# Patient Record
Sex: Male | Born: 1945 | Race: White | Hispanic: No | Marital: Married | Smoking: Former smoker
Health system: Southern US, Community
[De-identification: ages and names within clinical notes are randomized; demographics above are authoritative.]

## PROBLEM LIST (undated history)

## (undated) DIAGNOSIS — E7800 Pure hypercholesterolemia, unspecified: Secondary | ICD-10-CM

## (undated) DIAGNOSIS — J449 Chronic obstructive pulmonary disease, unspecified: Secondary | ICD-10-CM

## (undated) DIAGNOSIS — R569 Unspecified convulsions: Secondary | ICD-10-CM

## (undated) DIAGNOSIS — G4730 Sleep apnea, unspecified: Secondary | ICD-10-CM

---

## 2004-06-12 ENCOUNTER — Ambulatory Visit: Payer: Self-pay | Admitting: General Surgery

## 2009-01-12 ENCOUNTER — Ambulatory Visit: Payer: Self-pay | Admitting: Internal Medicine

## 2010-06-26 ENCOUNTER — Ambulatory Visit: Payer: Self-pay | Admitting: Orthopedic Surgery

## 2010-07-03 ENCOUNTER — Inpatient Hospital Stay: Payer: Self-pay | Admitting: Orthopedic Surgery

## 2013-12-04 DIAGNOSIS — I2510 Atherosclerotic heart disease of native coronary artery without angina pectoris: Secondary | ICD-10-CM | POA: Insufficient documentation

## 2013-12-04 DIAGNOSIS — E782 Mixed hyperlipidemia: Secondary | ICD-10-CM | POA: Insufficient documentation

## 2014-02-22 DIAGNOSIS — I6523 Occlusion and stenosis of bilateral carotid arteries: Secondary | ICD-10-CM | POA: Insufficient documentation

## 2014-02-22 DIAGNOSIS — I340 Nonrheumatic mitral (valve) insufficiency: Secondary | ICD-10-CM | POA: Insufficient documentation

## 2014-08-10 DIAGNOSIS — I10 Essential (primary) hypertension: Secondary | ICD-10-CM | POA: Insufficient documentation

## 2016-03-12 ENCOUNTER — Encounter: Payer: Medicare Other | Attending: Surgery | Admitting: Surgery

## 2016-03-12 DIAGNOSIS — X100XXA Contact with hot drinks, initial encounter: Secondary | ICD-10-CM | POA: Diagnosis not present

## 2016-03-12 DIAGNOSIS — T24211A Burn of second degree of right thigh, initial encounter: Secondary | ICD-10-CM | POA: Diagnosis not present

## 2016-03-12 DIAGNOSIS — S71101A Unspecified open wound, right thigh, initial encounter: Secondary | ICD-10-CM | POA: Insufficient documentation

## 2016-03-12 DIAGNOSIS — Z87891 Personal history of nicotine dependence: Secondary | ICD-10-CM | POA: Insufficient documentation

## 2016-03-12 DIAGNOSIS — I10 Essential (primary) hypertension: Secondary | ICD-10-CM | POA: Insufficient documentation

## 2016-03-12 DIAGNOSIS — F418 Other specified anxiety disorders: Secondary | ICD-10-CM | POA: Diagnosis not present

## 2016-03-12 DIAGNOSIS — E785 Hyperlipidemia, unspecified: Secondary | ICD-10-CM | POA: Insufficient documentation

## 2016-03-12 DIAGNOSIS — I2510 Atherosclerotic heart disease of native coronary artery without angina pectoris: Secondary | ICD-10-CM | POA: Insufficient documentation

## 2016-03-12 DIAGNOSIS — M1990 Unspecified osteoarthritis, unspecified site: Secondary | ICD-10-CM | POA: Diagnosis not present
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
## 2016-03-12 NOTE — Progress Notes (Signed)
OLAF, MESA ([id number]) Visit Report for 03/12/2016 Abuse/Suicide Risk Screen Details Patient Name: Alex Gomez, Alex Gomez. Date of Service: 03/12/2016 8:00 AM Medical Record Number: [id number] Patient Account Number: [id number] Date of Birth/Sex: 1945/05/27 (71 y.o. Male) Treating RN: Ahmed Prima Primary Care Nareh Matzke: SYSTEM, PCP Other Clinician: Referring Boaz Berisha: Serafina Royals Treating Massiel Stipp/Extender: Frann Rider in Treatment: 0 Abuse/Suicide Risk Screen Items Answer ABUSE/SUICIDE RISK SCREEN: Has anyone close to you tried to hurt or harm you recentlyo No Do you feel uncomfortable with anyone in your familyo No Has anyone forced you do things that you didnot want to doo No Do you have any thoughts of harming yourselfo No Patient displays signs or symptoms of abuse and/or neglect. No Electronic Signature(s) Signed: 03/12/2016 12:02:17 PM By: Alric Quan Entered By: Alric Quan on 03/12/2016 08:25:17 Alex Gomez ([id number]) -------------------------------------------------------------------------------- Activities of Daily Living Details Patient Name: Alex Gomez, Alex Gomez. Date of Service: 03/12/2016 8:00 AM Medical Record Number: [id number] Patient Account Number: [id number] Date of Birth/Sex: 11/03/1945 (71 y.o. Male) Treating RN: Ahmed Prima Primary Care Lancer Thurner: SYSTEM, PCP Other Clinician: Referring Tayleigh Wetherell: Serafina Royals Treating Marrissa Dai/Extender: Frann Rider in Treatment: 0 Activities of Daily Living Items Answer Activities of Daily Living (Please select one for each item) Drive Automobile Completely Able Take Medications Completely Able Use Telephone Completely Able Care for Appearance Completely Able Use Toilet Completely Able Bath / Shower Completely Able Dress Self Completely Able Feed Self Completely Able Walk Completely Able Get In / Out Bed Completely Able Housework Completely Able Prepare Meals Completely [place] for Self Completely Able Electronic Signature(s) Signed: 03/12/2016 12:02:17 PM By: Alric Quan Entered By: Alric Quan on 03/12/2016 08:25:38 Alex Gomez ([id number]) -------------------------------------------------------------------------------- Education Assessment Details Patient Name: Alex Gomez Date of Service: 03/12/2016 8:00 AM Medical Record Number: [id number] Patient Account Number: [id number] Date of Birth/Sex: 10-29-1945 (71 y.o. Male) Treating RN: Ahmed Prima Primary Care Honey Zakarian: SYSTEM, PCP Other Clinician: Referring Breaunna Gottlieb: Serafina Royals Treating Tyray Proch/Extender: Frann Rider in Treatment: 0 Primary Learner Assessed: Patient Learning Preferences/Education Level/Primary Language Learning Preference: Explanation, Printed Material Highest Education Level: College or Above Preferred Language: English Cognitive Barrier Assessment/Beliefs Language Barrier: No Translator Needed: No Memory Deficit: No Emotional Barrier: No Cultural/Religious Beliefs Affecting Medical No Care: Physical Barrier Assessment Impaired Vision: No Impaired Hearing: No Decreased Hand dexterity: No Knowledge/Comprehension Assessment Knowledge Level: Medium Comprehension Level: Medium Ability to understand written Medium instructions: Ability to understand verbal Medium instructions: Motivation Assessment Anxiety Level: Calm Cooperation: Cooperative Education Importance: Acknowledges Need Interest in Health Problems: Asks Questions Perception: Coherent Willingness to Engage in Self- Medium Management Activities: Readiness to Engage in Self- Medium Management Activities: Electronic Signature(s) Alex Gomez, Alex Gomez ([id number]) Signed: 03/12/2016 12:02:17 PM By: Alric Quan Entered By: Alric Quan on 03/12/2016 08:26:06 Alex Gomez  ([id number]) -------------------------------------------------------------------------------- Fall Risk Assessment Details Patient Name: Alex Gomez Date of Service: 03/12/2016 8:00 AM Medical Record Number: [id number] Patient Account Number: [id number] Date of Birth/Sex: [birth year] (70 y.o. Male) Treating RN: Ahmed Prima Primary Care Chanell Nadeau: SYSTEM, PCP Other Clinician: Referring Sims Laday: Serafina Royals Treating [place]bury Hair/Extender: Frann Rider in Treatment: 0 Fall Risk Assessment Items Have you had 2 or more falls in the last 12 monthso 0 No Have you had any fall that resulted in injury in the last 12 monthso 0 No FALL RISK ASSESSMENT: History of falling - immediate or within 3 months 0 No Secondary diagnosis 0 No Ambulatory aid None/bed rest/wheelchair/nurse 0 No Crutches/cane/walker 0 No  Furniture 0 No IV Access/Saline Lock 0 No Gait/Training Normal/bed rest/immobile 0 No Weak 0 No Impaired 0 No Mental Status Oriented to own ability 0 Yes Electronic Signature(s) Signed: 03/12/2016 12:02:17 PM By: Alric Quan Entered By: Alric Quan on 03/12/2016 08:26:18 Alex Gomez ([id number]) -------------------------------------------------------------------------------- Foot Assessment Details Patient Name: Alex Gomez. Date of Service: 03/12/2016 8:00 AM Medical Record Number: [id number] Patient Account Number: [id number] Date of Birth/Sex: August 31, 1945 (71 y.o. Male) Treating RN: Ahmed Prima Primary Care Gio Janoski: SYSTEM, PCP Other Clinician: Referring Kayleah Appleyard: Serafina Royals Treating Zoria Rawlinson/Extender: Frann Rider in Treatment: 0 Foot Assessment Items Site Locations + = Sensation present, - = Sensation absent, C = Callus, U = Ulcer R = Redness, W = Warmth, M = Maceration, PU = Pre-ulcerative lesion F = Fissure, S = Swelling, D = Dryness Assessment Right: Left: Other Deformity: No No Prior Foot Ulcer: No No Prior Amputation: No  No Charcot Joint: No No Ambulatory Status: Ambulatory Without Help Gait: Steady Electronic Signature(s) Signed: 03/12/2016 12:02:17 PM By: Alric Quan Entered By: Alric Quan on 03/12/2016 08:34:53 Alex Gomez ([id number]) -------------------------------------------------------------------------------- Nutrition Risk Assessment Details Patient Name: Alex Gomez. Date of Service: 03/12/2016 8:00 AM Medical Record Number: [id number] Patient Account Number: [id number] Date of Birth/Sex: 19-Feb-1945 (71 y.o. Male) Treating RN: Ahmed Prima Primary Care Lei Dower: SYSTEM, PCP Other Clinician: Referring Mada Sadik: Serafina Royals Treating Daivion Pape/Extender: Frann Rider in Treatment: 0 Height (in): 73 Weight (lbs): 240 Body Mass Index (BMI): 31.7 Nutrition Risk Assessment Items NUTRITION RISK SCREEN: I have an illness or condition that made me change the kind and/or 0 No amount of food I eat I eat fewer than two meals per day 0 No I eat few fruits and vegetables, or milk products 0 No I have three or more drinks of beer, liquor or wine almost every day 0 No I have tooth or mouth problems that make it hard for me to eat 0 No I don't always have enough money to buy the food I need 0 No I eat alone most of the time 0 No I take three or more different prescribed or over-the-counter drugs a 1 Yes day Without wanting to, I have lost or gained 10 pounds in the last six 0 No months I am not always physically able to shop, cook and/or feed myself 0 No Nutrition Protocols Good Risk Protocol Moderate Risk Protocol Electronic Signature(s) Signed: 03/12/2016 12:02:17 PM By: Alric Quan Entered By: Alric Quan on 03/12/2016 08:26:38
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
## 2016-03-19 ENCOUNTER — Encounter: Payer: Medicare Other | Admitting: Surgery

## 2016-03-19 DIAGNOSIS — S71101A Unspecified open wound, right thigh, initial encounter: Secondary | ICD-10-CM | POA: Diagnosis not present
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
## 2016-03-26 ENCOUNTER — Encounter: Payer: Medicare Other | Admitting: Surgery

## 2016-03-26 DIAGNOSIS — S71101A Unspecified open wound, right thigh, initial encounter: Secondary | ICD-10-CM | POA: Diagnosis not present
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
## 2016-04-02 ENCOUNTER — Encounter: Payer: Medicare Other | Attending: Surgery | Admitting: Surgery

## 2016-04-02 DIAGNOSIS — E785 Hyperlipidemia, unspecified: Secondary | ICD-10-CM | POA: Insufficient documentation

## 2016-04-02 DIAGNOSIS — X100XXA Contact with hot drinks, initial encounter: Secondary | ICD-10-CM | POA: Insufficient documentation

## 2016-04-02 DIAGNOSIS — S71101A Unspecified open wound, right thigh, initial encounter: Secondary | ICD-10-CM | POA: Insufficient documentation

## 2016-04-02 DIAGNOSIS — F418 Other specified anxiety disorders: Secondary | ICD-10-CM | POA: Insufficient documentation

## 2016-04-02 DIAGNOSIS — M1990 Unspecified osteoarthritis, unspecified site: Secondary | ICD-10-CM | POA: Insufficient documentation

## 2016-04-02 DIAGNOSIS — T24211A Burn of second degree of right thigh, initial encounter: Secondary | ICD-10-CM | POA: Insufficient documentation

## 2016-04-02 DIAGNOSIS — I10 Essential (primary) hypertension: Secondary | ICD-10-CM | POA: Diagnosis not present

## 2016-04-02 DIAGNOSIS — Z87891 Personal history of nicotine dependence: Secondary | ICD-10-CM | POA: Insufficient documentation

## 2016-04-02 DIAGNOSIS — I2510 Atherosclerotic heart disease of native coronary artery without angina pectoris: Secondary | ICD-10-CM | POA: Insufficient documentation
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
## 2017-04-04 DIAGNOSIS — I350 Nonrheumatic aortic (valve) stenosis: Secondary | ICD-10-CM | POA: Insufficient documentation

## 2018-02-03 ENCOUNTER — Encounter: Payer: Self-pay | Admitting: *Deleted

## 2018-02-03 ENCOUNTER — Encounter: Payer: No Typology Code available for payment source | Attending: Internal Medicine | Admitting: *Deleted

## 2018-02-03 VITALS — Ht 72.6 in | Wt 236.0 lb

## 2018-02-03 DIAGNOSIS — Z87891 Personal history of nicotine dependence: Secondary | ICD-10-CM | POA: Diagnosis not present

## 2018-02-03 DIAGNOSIS — K5730 Diverticulosis of large intestine without perforation or abscess without bleeding: Secondary | ICD-10-CM | POA: Insufficient documentation

## 2018-02-03 DIAGNOSIS — Z952 Presence of prosthetic heart valve: Secondary | ICD-10-CM

## 2018-02-03 DIAGNOSIS — I350 Nonrheumatic aortic (valve) stenosis: Secondary | ICD-10-CM | POA: Diagnosis not present

## 2018-02-03 DIAGNOSIS — F32A Depression, unspecified: Secondary | ICD-10-CM

## 2018-02-03 DIAGNOSIS — F329 Major depressive disorder, single episode, unspecified: Secondary | ICD-10-CM

## 2018-02-03 NOTE — Progress Notes (Signed)
Cardiac Individual Treatment Plan  Patient Details  Name: Alex Gomez MRN: [id number] Date of Birth: 10/25/1945 Referring Provider:     Cardiac Rehab from 02/03/2018 in [facility] Cardiac and Pulmonary Rehab  Referring Provider  Diona Browner MD      Initial Encounter Date:    Cardiac Rehab from 02/03/2018 in [facility] Cardiac and Pulmonary Rehab  Date  02/03/18      Visit Diagnosis: S/P AVR (aortic valve replacement)  Depressive disorder  Diverticulosis of colon  Patient's Home Medications on Admission:  Current Outpatient Medications:  .  amLODipine (NORVASC) 10 MG tablet, Take by mouth., Disp: , Rfl:  .  aspirin EC 81 MG tablet, Take by mouth., Disp: , Rfl:  .  benzonatate (TESSALON) 200 MG capsule, Take 200 mg by mouth at bedtime as needed for cough., Disp: , Rfl:  .  carboxymethylcellulose (REFRESH PLUS) 0.5 % SOLN, 1 drop 3 (three) times daily as needed., Disp: , Rfl:  .  Cholecalciferol 25 MCG (1000 UT) tablet, Take 1,000 Units by mouth daily., Disp: , Rfl:  .  finasteride (PROSCAR) 5 MG tablet, Take by mouth., Disp: , Rfl:  .  fluticasone (FLONASE) 50 MCG/ACT nasal spray, Place into the nose., Disp: , Rfl:  .  furosemide (LASIX) 40 MG tablet, Take by mouth., Disp: , Rfl:  .  guaiFENesin-codeine 100-10 MG/5ML syrup, Take by mouth., Disp: , Rfl:  .  oxybutynin (DITROPAN-XL) 5 MG 24 hr tablet, Take by mouth., Disp: , Rfl:  .  prazosin (MINIPRESS) 5 MG capsule, Take by mouth., Disp: , Rfl:  .  sennosides-docusate sodium (SENOKOT-S) 8.6-50 MG tablet, Take 2 tablets by mouth daily., Disp: , Rfl:  .  triamcinolone ointment (KENALOG) 0.1 %, Frequency:BID   Dosage:0.0     Instructions:  Note:Dose: 0.1%, Disp: , Rfl:  .  venlafaxine XR (EFFEXOR-XR) 150 MG 24 hr capsule, Take by mouth., Disp: , Rfl:  .  zolpidem (AMBIEN CR) 12.5 MG CR tablet, Take by mouth., Disp: , Rfl:   Past Medical History: History reviewed. No pertinent past medical history.  Tobacco Use: Social History   Tobacco  Use  Smoking Status Former Smoker  . Packs/day: 1.00  . Years: 30.00  . Pack years: 30.00  . Types: Cigarettes  . Start date: 72  . Last attempt to quit: 1999  . Years since quitting: 21.1  Smokeless Tobacco Never Used    Labs: Recent Review Flowsheet Data    There is no flowsheet data to display.       Exercise Target Goals: Exercise Program Goal: Individual exercise prescription set using results from initial 6 min walk test and THRR while considering  patient's activity barriers and safety.   Exercise Prescription Goal: Initial exercise prescription builds to 30-45 minutes a day of aerobic activity, 2-3 days per week.  Home exercise guidelines will be given to patient during program as part of exercise prescription that the participant will acknowledge.  Activity Barriers & Risk Stratification: Activity Barriers & Cardiac Risk Stratification - 02/03/18 1342      Activity Barriers & Cardiac Risk Stratification   Activity Barriers  Joint Problems;Deconditioning;Muscular Weakness;Balance Concerns   Occassional knee pain   Cardiac Risk Stratification  High       6 Minute Walk: 6 Minute Walk    Row Name 02/03/18 1341         6 Minute Walk   Phase  Initial     Distance  1400 feet     Walk  Time  6 minutes     # of Rest Breaks  0     MPH  2.65     METS  11.26     RPE  11     VO2 Peak  3.22     Symptoms  No     Resting HR  78 bpm     Resting BP  134/66     Resting Oxygen Saturation   95 %     Exercise Oxygen Saturation  during 6 min walk  97 %     Max Ex. HR  114 bpm     Max Ex. BP  154/82     2 Minute Post BP  122/64        Oxygen Initial Assessment:   Oxygen Re-Evaluation:   Oxygen Discharge (Final Oxygen Re-Evaluation):   Initial Exercise Prescription: Initial Exercise Prescription - 02/03/18 1300      Date of Initial Exercise RX and Referring Provider   Date  02/03/18    Referring Provider  Diona Browner MD      Treadmill   MPH  2.5     Grade  0.5    Minutes  15    METs  3.09      Recumbant Bike   Level  3    RPM  50    Watts  41    Minutes  15    METs  3      T5 Nustep   Level  3    SPM  80    Minutes  15    METs  3      Prescription Details   Frequency (times per week)  3    Duration  Progress to 45 minutes of aerobic exercise without signs/symptoms of physical distress      Intensity   THRR 40-80% of Max Heartrate  107-136    Ratings of Perceived Exertion  11-13    Perceived Dyspnea  0-4      Progression   Progression  Continue to progress workloads to maintain intensity without signs/symptoms of physical distress.      Resistance Training   Training Prescription  Yes    Weight  3 lbs    Reps  10-15       Perform Capillary Blood Glucose checks as needed.  Exercise Prescription Changes: Exercise Prescription Changes    Row Name 02/03/18 1300             Response to Exercise   Blood Pressure (Admit)  134/66       Blood Pressure (Exercise)  154/82       Blood Pressure (Exit)  122/64       Heart Rate (Admit)  78 bpm       Heart Rate (Exercise)  114 bpm       Heart Rate (Exit)  83 bpm       Oxygen Saturation (Admit)  95 %       Oxygen Saturation (Exercise)  97 %       Rating of Perceived Exertion (Exercise)  11       Symptoms  none       Comments  walk test results          Exercise Comments:   Exercise Goals and Review: Exercise Goals    Row Name 02/03/18 1345             Exercise Goals   Increase Physical Activity  Yes  Intervention  Provide advice, education, support and counseling about physical activity/exercise needs.;Develop an individualized exercise prescription for aerobic and resistive training based on initial evaluation findings, risk stratification, comorbidities and participant's personal goals.       Expected Outcomes  Short Term: Attend rehab on a regular basis to increase amount of physical activity.;Long Term: Add in home exercise to make exercise part  of routine and to increase amount of physical activity.;Long Term: Exercising regularly at least 3-5 days a week.       Increase Strength and Stamina  Yes       Intervention  Develop an individualized exercise prescription for aerobic and resistive training based on initial evaluation findings, risk stratification, comorbidities and participant's personal goals.;Provide advice, education, support and counseling about physical activity/exercise needs.       Expected Outcomes  Short Term: Increase workloads from initial exercise prescription for resistance, speed, and METs.;Short Term: Perform resistance training exercises routinely during rehab and add in resistance training at home;Long Term: Improve cardiorespiratory fitness, muscular endurance and strength as measured by increased METs and functional capacity (6MWT)       Able to understand and use rate of perceived exertion (RPE) scale  Yes       Intervention  Provide education and explanation on how to use RPE scale       Expected Outcomes  Long Term:  Able to use RPE to guide intensity level when exercising independently;Short Term: Able to use RPE daily in rehab to express subjective intensity level       Knowledge and understanding of Target Heart Rate Range (THRR)  Yes       Intervention  Provide education and explanation of THRR including how the numbers were predicted and where they are located for reference       Expected Outcomes  Short Term: Able to state/look up THRR;Long Term: Able to use THRR to govern intensity when exercising independently;Short Term: Able to use daily as guideline for intensity in rehab       Able to check pulse independently  Yes       Intervention  Provide education and demonstration on how to check pulse in carotid and radial arteries.;Review the importance of being able to check your own pulse for safety during independent exercise       Expected Outcomes  Short Term: Able to explain why pulse checking is important  during independent exercise;Long Term: Able to check pulse independently and accurately       Understanding of Exercise Prescription  Yes       Intervention  Provide education, explanation, and written materials on patient's individual exercise prescription       Expected Outcomes  Short Term: Able to explain program exercise prescription;Long Term: Able to explain home exercise prescription to exercise independently          Exercise Goals Re-Evaluation :   Discharge Exercise Prescription (Final Exercise Prescription Changes): Exercise Prescription Changes - 02/03/18 1300      Response to Exercise   Blood Pressure (Admit)  134/66    Blood Pressure (Exercise)  154/82    Blood Pressure (Exit)  122/64    Heart Rate (Admit)  78 bpm    Heart Rate (Exercise)  114 bpm    Heart Rate (Exit)  83 bpm    Oxygen Saturation (Admit)  95 %    Oxygen Saturation (Exercise)  97 %    Rating of Perceived Exertion (Exercise)  11  Symptoms  none    Comments  walk test results       Nutrition:  Target Goals: Understanding of nutrition guidelines, daily intake of sodium '1500mg'$ , cholesterol '200mg'$ , calories 30% from fat and 7% or less from saturated fats, daily to have 5 or more servings of fruits and vegetables.  Biometrics: Pre Biometrics - 02/03/18 1345      Pre Biometrics   Height  6' 0.6" (1.844 m)    Weight  236 lb (107 kg)    Waist Circumference  41 inches    Hip Circumference  42 inches    Waist to Hip Ratio  0.98 %    BMI (Calculated)  31.48    Single Leg Stand  2.96 seconds        Nutrition Therapy Plan and Nutrition Goals: Nutrition Therapy & Goals - 02/03/18 1451      Intervention Plan   Intervention  Prescribe, educate and counsel regarding individualized specific dietary modifications aiming towards targeted core components such as weight, hypertension, lipid management, diabetes, heart failure and other comorbidities.;Nutrition handout(s) given to patient.    Expected  Outcomes  Short Term Goal: Understand basic principles of dietary content, such as calories, fat, sodium, cholesterol and nutrients.;Long Term Goal: Adherence to prescribed nutrition plan.;Short Term Goal: A plan has been developed with personal nutrition goals set during dietitian appointment.       Nutrition Assessments: Nutrition Assessments - 02/03/18 1451      MEDFICTS Scores   Pre Score  65       Nutrition Goals Re-Evaluation:   Nutrition Goals Discharge (Final Nutrition Goals Re-Evaluation):   Psychosocial: Target Goals: Acknowledge presence or absence of significant depression and/or stress, maximize coping skills, provide positive support system. Participant is able to verbalize types and ability to use techniques and skills needed for reducing stress and depression.   Initial Review & Psychosocial Screening: Initial Psych Review & Screening - 02/03/18 1448      Initial Review   Current issues with  Current Psychotropic Meds;Current Stress Concerns;Current Sleep Concerns    Source of Stress Concerns  Unable to perform yard/household activities    Comments  Otila Kluver is on disability through the [place] for PTSD. He is on medication, but can get startled if someone comes up from behind him. He reports sleeping better once his doctor started him on [place]. 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 He was used to staying active, so he tries to stay connected to the community by participating in different historical events. He loves history and takes pride in his roots.       Family Dynamics   Good Support System?  Yes   wife     Barriers   Psychosocial barriers to participate in program  There are no identifiable barriers or psychosocial needs.;The patient should benefit from training in stress management and relaxation.      Screening Interventions   Interventions  Encouraged to exercise;Program counselor consult;To provide support and resources  with identified psychosocial needs;Provide feedback about the scores to participant    Expected Outcomes  Short Term goal: Utilizing psychosocial counselor, staff and physician to assist with identification of specific Stressors or current issues interfering with healing process. Setting desired goal for each stressor or current issue identified.;Long Term Goal: Stressors or current issues are controlled or eliminated.;Short Term goal: Identification and review with participant of any Quality of Life or Depression concerns found by scoring the questionnaire.;Long Term  goal: The participant improves quality of Life and PHQ9 Scores as seen by post scores and/or verbalization of changes       Quality of Life Scores:  Quality of Life - 02/03/18 1451      Quality of Life   Select  Quality of Life      Quality of Life Scores   Health/Function Pre  29.03 %    Socioeconomic Pre  28.79 %    Psych/Spiritual Pre  30 %    Family Pre  30 %    GLOBAL Pre  29.3 %      Scores of 19 and below usually indicate a poorer quality of life in these areas.  A difference of  2-3 points is a clinically meaningful difference.  A difference of 2-3 points in the total score of the Quality of Life Index has been associated with significant improvement in overall quality of life, self-image, physical symptoms, and general health in studies assessing change in quality of life.  PHQ-9: Recent Review Flowsheet Data    Depression screen [facility] 2/9 02/03/2018   Decreased Interest 0   Down, Depressed, Hopeless 1   PHQ - 2 Score 1   Altered sleeping 0   Tired, decreased energy 1   Change in appetite 0   Feeling bad or failure about yourself  1   Trouble concentrating 0   Moving slowly or fidgety/restless 1   Suicidal thoughts 0   PHQ-9 Score 4   Difficult doing work/chores Not difficult at all     Interpretation of Total Score  Total Score Depression Severity:  1-4 = Minimal depression, 5-9 = Mild depression, 10-14 =  Moderate depression, 15-19 = Moderately severe depression, 20-27 = Severe depression   Psychosocial Evaluation and Intervention:   Psychosocial Re-Evaluation:   Psychosocial Discharge (Final Psychosocial Re-Evaluation):   Vocational Rehabilitation: Provide vocational rehab assistance to qualifying candidates.   Vocational Rehab Evaluation & Intervention: Vocational Rehab - 02/03/18 1448      Initial Vocational Rehab Evaluation & Intervention   Assessment shows need for Vocational Rehabilitation  No       Education: Education Goals: Education classes will be provided on a variety of topics geared toward better understanding of heart health and risk factor modification. Participant will state understanding/return demonstration of topics presented as noted by education test scores.  Learning Barriers/Preferences: Learning Barriers/Preferences - 02/03/18 1445      Learning Barriers/Preferences   Learning Barriers  Hearing    Learning Preferences  Individual Instruction;Verbal Instruction       Education Topics:  AED/CPR: - Group verbal and written instruction with the use of models to demonstrate the basic use of the AED with the basic ABC's of resuscitation.   General Nutrition Guidelines/Fats and Fiber: -Group instruction provided by verbal, written material, models and posters to present the general guidelines for heart healthy nutrition. Gives an explanation and review of dietary fats and fiber.   Controlling Sodium/Reading Food Labels: -Group verbal and written material supporting the discussion of sodium use in heart healthy nutrition. Review and explanation with models, verbal and written materials for utilization of the food label.   Exercise Physiology & General Exercise Guidelines: - Group verbal and written instruction with models to review the exercise physiology of the cardiovascular system and associated critical values. Provides general exercise guidelines  with specific guidelines to those with heart or lung disease.    Aerobic Exercise & Resistance Training: - Gives group verbal and written instruction on  the various components of exercise. Focuses on aerobic and resistive training programs and the benefits of this training and how to safely progress through these programs..   Flexibility, Balance, Mind/Body Relaxation: Provides group verbal/written instruction on the benefits of flexibility and balance training, including mind/body exercise modes such as yoga, pilates and tai chi.  Demonstration and skill practice provided.   Stress and Anxiety: - Provides group verbal and written instruction about the health risks of elevated stress and causes of high stress.  Discuss the correlation between heart/lung disease and anxiety and treatment options. Review healthy ways to manage with stress and anxiety.   Depression: - Provides group verbal and written instruction on the correlation between heart/lung disease and depressed mood, treatment options, and the stigmas associated with seeking treatment.   Anatomy & Physiology of the Heart: - Group verbal and written instruction and models provide basic cardiac anatomy and physiology, with the coronary electrical and arterial systems. Review of Valvular disease and Heart Failure   Cardiac Procedures: - Group verbal and written instruction to review commonly prescribed medications for heart disease. Reviews the medication, class of the drug, and side effects. Includes the steps to properly store meds and maintain the prescription regimen. (beta blockers and nitrates)   Cardiac Medications I: - Group verbal and written instruction to review commonly prescribed medications for heart disease. Reviews the medication, class of the drug, and side effects. Includes the steps to properly store meds and maintain the prescription regimen.   Cardiac Medications II: -Group verbal and written instruction to  review commonly prescribed medications for heart disease. Reviews the medication, class of the drug, and side effects. (all other drug classes)    Go Sex-Intimacy & Heart Disease, Get SMART - Goal Setting: - Group verbal and written instruction through game format to discuss heart disease and the return to sexual intimacy. Provides group verbal and written material to discuss and apply goal setting through the application of the S.M.A.R.T. Method.   Other Matters of the Heart: - Provides group verbal, written materials and models to describe Stable Angina and Peripheral Artery. Includes description of the disease process and treatment options available to the cardiac patient.   Exercise & Equipment Safety: - Individual verbal instruction and demonstration of equipment use and safety with use of the equipment.   Cardiac Rehab from 02/03/2018 in [facility] Cardiac and Pulmonary Rehab  Date  02/03/18  Educator  [facility]  Instruction Review Code  1- Verbalizes Understanding      Infection Prevention: - Provides verbal and written material to individual with discussion of infection control including proper hand washing and proper equipment cleaning during exercise session.   Cardiac Rehab from 02/03/2018 in [facility] Cardiac and Pulmonary Rehab  Date  02/03/18  Educator  [facility]  Instruction Review Code  1- Verbalizes Understanding      Falls Prevention: - Provides verbal and written material to individual with discussion of falls prevention and safety.   Cardiac Rehab from 02/03/2018 in [facility] Cardiac and Pulmonary Rehab  Date  02/03/18  Educator  [facility]  Instruction Review Code  1- Verbalizes Understanding      Diabetes: - Individual verbal and written instruction to review signs/symptoms of diabetes, desired ranges of glucose level fasting, after meals and with exercise. Acknowledge that pre and post exercise glucose checks will be done for 3 sessions at entry of program.   Know Your Numbers and Risk  Factors: -Group verbal and written instruction about important numbers in your health.  Discussion of what are risk factors and how they play a role in the disease process.  Review of Cholesterol, Blood Pressure, Diabetes, and BMI and the role they play in your overall health.   Sleep Hygiene: -Provides group verbal and written instruction about how sleep can affect your health.  Define sleep hygiene, discuss sleep cycles and impact of sleep habits. Review good sleep hygiene tips.    Other: -Provides group and verbal instruction on various topics (see comments)   Knowledge Questionnaire Score: Knowledge Questionnaire Score - 02/03/18 1445      Knowledge Questionnaire Score   Pre Score  23/26       Core Components/Risk Factors/Patient Goals at Admission: Personal Goals and Risk Factors at Admission - 02/03/18 1444      Core Components/Risk Factors/Patient Goals on Admission    Weight Management  Yes;Weight Loss    Intervention  Weight Management: Develop a combined nutrition and exercise program designed to reach desired caloric intake, while maintaining appropriate intake of nutrient and fiber, sodium and fats, and appropriate energy expenditure required for the weight goal.;Weight Management: Provide education and appropriate resources to help participant work on and attain dietary goals.;Weight Management/Obesity: Establish reasonable short term and long term weight goals.    Admit Weight  236 lb (107 kg)    Goal Weight: Short Term  232 lb (105.2 kg)    Goal Weight: Long Term  225 lb (102.1 kg)    Expected Outcomes  Short Term: Continue to assess and modify interventions until short term weight is achieved;Long Term: Adherence to nutrition and physical activity/exercise program aimed toward attainment of established weight goal;Weight Loss: Understanding of general recommendations for a balanced deficit meal plan, which promotes 1-2 lb weight loss per week and includes a negative  energy balance of [phone] kcal/d;Understanding recommendations for meals to include 15-35% energy as protein, 25-35% energy from fat, 35-60% energy from carbohydrates, less than '200mg'$  of dietary cholesterol, 20-35 gm of total fiber daily;Understanding of distribution of calorie intake throughout the day with the consumption of 4-5 meals/snacks    Hypertension  Yes    Intervention  Provide education on lifestyle modifcations including regular physical activity/exercise, weight management, moderate sodium restriction and increased consumption of fresh fruit, vegetables, and low fat dairy, alcohol moderation, and smoking cessation.;Monitor prescription use compliance.    Expected Outcomes  Short Term: Continued assessment and intervention until BP is < 140/48m HG in hypertensive participants. < 130/825mHG in hypertensive participants with diabetes, heart failure or chronic kidney disease.;Long Term: Maintenance of blood pressure at goal levels.    Lipids  Yes    Intervention  Provide education and support for participant on nutrition & aerobic/resistive exercise along with prescribed medications to achieve LDL '70mg'$ , HDL >'40mg'$ .    Expected Outcomes  Short Term: Participant states understanding of desired cholesterol values and is compliant with medications prescribed. Participant is following exercise prescription and nutrition guidelines.;Long Term: Cholesterol controlled with medications as prescribed, with individualized exercise RX and with personalized nutrition plan. Value goals: LDL < '70mg'$ , HDL > 40 mg.       Core Components/Risk Factors/Patient Goals Review:    Core Components/Risk Factors/Patient Goals at Discharge (Final Review):    ITP Comments: ITP Comments    Row Name 02/03/18 1410           ITP Comments  Med Review completed. Initial ITP created. [place]  Comments: Initial ITP

## 2018-02-03 NOTE — Progress Notes (Signed)
Daily Session Note  Patient Details  Name: Alex Gomez MRN: [id number] Date of Birth: March 05, 1945 Referring Provider:     Cardiac Rehab from 02/03/2018 in [facility] Cardiac and Pulmonary Rehab  Referring Provider  Diona Browner MD      Encounter Date: 02/03/2018  Check In: Session Check In - 02/03/18 1407      Check-In   Supervising physician immediately available to respond to emergencies  See telemetry face sheet for immediately available ER MD    Location  [facility]-Cardiac & Pulmonary Rehab    Staff Present  Renita Papa, RN BSN;Jessica Luan Pulling, MA, RCEP, CCRP, Exercise Physiologist    Medication changes reported      No    Fall or balance concerns reported     No    Warm-up and Cool-down  Not performed (comment)   med review completed   Resistance Training Performed  Yes    VAD Patient?  No    PAD/SET Patient?  No      Pain Assessment   Currently in Pain?  No/denies        Exercise Prescription Changes - 02/03/18 1300      Response to Exercise   Blood Pressure (Admit)  134/66    Blood Pressure (Exercise)  154/82    Blood Pressure (Exit)  122/64    Heart Rate (Admit)  78 bpm    Heart Rate (Exercise)  114 bpm    Heart Rate (Exit)  83 bpm    Oxygen Saturation (Admit)  95 %    Oxygen Saturation (Exercise)  97 %    Rating of Perceived Exertion (Exercise)  11    Symptoms  none    Comments  walk test results       Social History   Tobacco Use  Smoking Status Former Smoker  . Packs/day: 1.00  . Years: 30.00  . Pack years: 30.00  . Types: Cigarettes  . Start date: 51  . Last attempt to quit: 1999  . Years since quitting: 21.1  Smokeless Tobacco Never Used    Goals Met:  Proper associated with RPD/PD & O2 Sat Exercise tolerated well Personal goals reviewed No report of cardiac concerns or symptoms Strength training completed today  Goals Unmet:  Not Applicable  Comments: Med Review completed   Dr. Emily Filbert is Medical Director for [place] and [facility].

## 2018-02-03 NOTE — Patient Instructions (Signed)
Patient Instructions  Patient Details  Name: Alex Gomez MRN: [id number] Date of Birth: [birth year] Referring Provider:  Diona Browner, MD  Below are your personal goals for exercise, nutrition, and risk factors. Our goal is to help you stay on track towards obtaining and maintaining these goals. We will be discussing your progress on these goals with you throughout the program.  Initial Exercise Prescription: Initial Exercise Prescription - 02/03/18 1300      Date of Initial Exercise RX and Referring Provider   Date  02/03/18    Referring Provider  Diona Browner MD      Treadmill   MPH  2.5    Grade  0.5    Minutes  15    METs  3.09      Recumbant Bike   Level  3    RPM  50    Watts  41    Minutes  15    METs  3      T5 Nustep   Level  3    SPM  80    Minutes  15    METs  3      Prescription Details   Frequency (times per week)  3    Duration  Progress to 45 minutes of aerobic exercise without signs/symptoms of physical distress      Intensity   THRR 40-80% of Max Heartrate  107-136    Ratings of Perceived Exertion  11-13    Perceived Dyspnea  0-4      Progression   Progression  Continue to progress workloads to maintain intensity without signs/symptoms of physical distress.      Resistance Training   Training Prescription  Yes    Weight  3 lbs    Reps  10-15       Exercise Goals: Frequency: Be able to perform aerobic exercise two to three times per week in program working toward 2-5 days per week of home exercise.  Intensity: Work with a perceived exertion of 11 (fairly light) - 15 (hard) while following your exercise prescription.  We will make changes to your prescription with you as you progress through the program.   Duration: Be able to do 30 to 45 minutes of continuous aerobic exercise in addition to a 5 minute warm-up and a 5 minute cool-down routine.   Nutrition Goals: Your personal nutrition goals will be established when you do your  nutrition analysis with the dietician.  The following are general nutrition guidelines to follow: Cholesterol < 200mg /day Sodium < 1500mg /day Fiber: Men over 50 yrs - 30 grams per day  Personal Goals: Personal Goals and Risk Factors at Admission - 02/03/18 1444      Core Components/Risk Factors/Patient Goals on Admission    Weight Management  Yes;Weight Loss    Intervention  Weight Management: Develop a combined nutrition and exercise program designed to reach desired caloric intake, while maintaining appropriate intake of nutrient and fiber, sodium and fats, and appropriate energy expenditure required for the weight goal.;Weight Management: Provide education and appropriate resources to help participant work on and attain dietary goals.;Weight Management/Obesity: Establish reasonable short term and long term weight goals.    Admit Weight  236 lb (107 kg)    Goal Weight: Short Term  232 lb (105.2 kg)    Goal Weight: Long Term  225 lb (102.1 kg)    Expected Outcomes  Short Term: Continue to assess and modify interventions until short term weight is achieved;Long Term: Adherence  to nutrition and physical activity/exercise program aimed toward attainment of established weight goal;Weight Loss: Understanding of general recommendations for a balanced deficit meal plan, which promotes 1-2 lb weight loss per week and includes a negative energy balance of [id number] kcal/d;Understanding recommendations for meals to include 15-35% energy as protein, 25-35% energy from fat, 35-60% energy from carbohydrates, less than 200mg  of dietary cholesterol, 20-35 gm of total fiber daily;Understanding of distribution of calorie intake throughout the day with the consumption of 4-5 meals/snacks    Hypertension  Yes    Intervention  Provide education on lifestyle modifcations including regular physical activity/exercise, weight management, moderate sodium restriction and increased consumption of fresh fruit, vegetables, and  low fat dairy, alcohol moderation, and smoking cessation.;Monitor prescription use compliance.    Expected Outcomes  Short Term: Continued assessment and intervention until BP is < 140/58mm HG in hypertensive participants. < 130/21mm HG in hypertensive participants with diabetes, heart failure or chronic kidney disease.;Long Term: Maintenance of blood pressure at goal levels.    Lipids  Yes    Intervention  Provide education and support for participant on nutrition & aerobic/resistive exercise along with prescribed medications to achieve LDL 70mg , HDL >40mg .    Expected Outcomes  Short Term: Participant states understanding of desired cholesterol values and is compliant with medications prescribed. Participant is following exercise prescription and nutrition guidelines.;Long Term: Cholesterol controlled with medications as prescribed, with individualized exercise RX and with personalized nutrition plan. Value goals: LDL < 70mg , HDL > 40 mg.       Tobacco Use Initial Evaluation: Social History   Tobacco Use  Smoking Status Former Smoker  . Packs/day: 1.00  . Years: 30.00  . Pack years: 30.00  . Types: Cigarettes  . Start date: 77  . Last attempt to quit: 1999  . Years since quitting: 21.1  Smokeless Tobacco Never Used    Exercise Goals and Review: Exercise Goals    Row Name 02/03/18 1345             Exercise Goals   Increase Physical Activity  Yes       Intervention  Provide advice, education, support and counseling about physical activity/exercise needs.;Develop an individualized exercise prescription for aerobic and resistive training based on initial evaluation findings, risk stratification, comorbidities and participant's personal goals.       Expected Outcomes  Short Term: Attend rehab on a regular basis to increase amount of physical activity.;Long Term: Add in home exercise to make exercise part of routine and to increase amount of physical activity.;Long Term: Exercising  regularly at least 3-5 days a week.       Increase Strength and Stamina  Yes       Intervention  Develop an individualized exercise prescription for aerobic and resistive training based on initial evaluation findings, risk stratification, comorbidities and participant's personal goals.;Provide advice, education, support and counseling about physical activity/exercise needs.       Expected Outcomes  Short Term: Increase workloads from initial exercise prescription for resistance, speed, and METs.;Short Term: Perform resistance training exercises routinely during rehab and add in resistance training at home;Long Term: Improve cardiorespiratory fitness, muscular endurance and strength as measured by increased METs and functional capacity (6MWT)       Able to understand and use rate of perceived exertion (RPE) scale  Yes       Intervention  Provide education and explanation on how to use RPE scale       Expected Outcomes  Long Term:  Able to use RPE to guide intensity level when exercising independently;Short Term: Able to use RPE daily in rehab to express subjective intensity level       Knowledge and understanding of Target Heart Rate Range (THRR)  Yes       Intervention  Provide education and explanation of THRR including how the numbers were predicted and where they are located for reference       Expected Outcomes  Short Term: Able to state/look up THRR;Long Term: Able to use THRR to govern intensity when exercising independently;Short Term: Able to use daily as guideline for intensity in rehab       Able to check pulse independently  Yes       Intervention  Provide education and demonstration on how to check pulse in carotid and radial arteries.;Review the importance of being able to check your own pulse for safety during independent exercise       Expected Outcomes  Short Term: Able to explain why pulse checking is important during independent exercise;Long Term: Able to check pulse independently and  accurately       Understanding of Exercise Prescription  Yes       Intervention  Provide education, explanation, and written materials on patient's individual exercise prescription       Expected Outcomes  Short Term: Able to explain program exercise prescription;Long Term: Able to explain home exercise prescription to exercise independently          Copy of goals given to participant.

## 2018-02-07 ENCOUNTER — Encounter: Payer: No Typology Code available for payment source | Admitting: *Deleted

## 2018-02-07 DIAGNOSIS — Z952 Presence of prosthetic heart valve: Secondary | ICD-10-CM

## 2018-02-07 DIAGNOSIS — I350 Nonrheumatic aortic (valve) stenosis: Secondary | ICD-10-CM | POA: Diagnosis not present

## 2018-02-07 NOTE — Progress Notes (Signed)
Daily Session Note  Patient Details  Name: Alex Gomez MRN: [id number] Date of Birth: 19-Nov-1945 Referring Provider:     Cardiac Rehab from 02/03/2018 in [facility] Cardiac and Pulmonary Rehab  Referring Provider  Diona Browner MD      Encounter Date: 02/07/2018  Check In: Session Check In - 02/07/18 0735      Check-In   Supervising physician immediately available to respond to emergencies  See telemetry face sheet for immediately available ER MD    Location  [facility]-Cardiac & Pulmonary Rehab    Staff Present  Renita Papa, RN BSN;Demont Linford Luan Pulling, MA, RCEP, CCRP, Exercise Physiologist;Amanda Oletta Darter, [place], ACSM CEP, Exercise Physiologist    Medication changes reported      No    Fall or balance concerns reported     No    Warm-up and Cool-down  Performed as group-led instruction    Resistance Training Performed  Yes    VAD Patient?  No    PAD/SET Patient?  No      Pain Assessment   Currently in Pain?  No/denies          Social History   Tobacco Use  Smoking Status Former Smoker  . Packs/day: 1.00  . Years: 30.00  . Pack years: 30.00  . Types: Cigarettes  . Start date: 74  . Last attempt to quit: 1999  . Years since quitting: 21.1  Smokeless Tobacco Never Used    Goals Met:  Exercise tolerated well Personal goals reviewed No report of cardiac concerns or symptoms Strength training completed today  Goals Unmet:  Not Applicable  Comments: First full day of exercise!  Patient was oriented to gym and equipment including functions, settings, policies, and procedures.  Patient's individual exercise prescription and treatment plan were reviewed.  All starting workloads were established based on the results of the 6 minute walk test done at initial orientation visit.  The plan for exercise progression was also introduced and progression will be customized based on patient's performance and goals.    Dr. Emily Filbert is Medical Director for [place]  and [facility].

## 2018-02-10 ENCOUNTER — Encounter: Payer: No Typology Code available for payment source | Admitting: *Deleted

## 2018-02-10 DIAGNOSIS — I350 Nonrheumatic aortic (valve) stenosis: Secondary | ICD-10-CM | POA: Diagnosis not present

## 2018-02-10 DIAGNOSIS — Z952 Presence of prosthetic heart valve: Secondary | ICD-10-CM

## 2018-02-10 NOTE — Progress Notes (Signed)
Daily Session Note  Patient Details  Name: Alex Gomez MRN: [id number] Date of Birth: 10/23/1945 Referring Provider:     Cardiac Rehab from 02/03/2018 in [facility] Cardiac and Pulmonary Rehab  Referring Provider  Schwartz, Adam MD      Encounter Date: 02/10/2018  Check In: Session Check In - 02/10/18 0753      Check-In   Supervising physician immediately available to respond to emergencies  See telemetry face sheet for immediately available ER MD    Location  [facility]-Cardiac & Pulmonary Rehab    Staff Present   , BS, ACSM CEP, Exercise Physiologist;Jessica Hawkins, MA, RCEP, CCRP, Exercise Physiologist;Susanne Bice, RN, BSN, CCRP    Medication changes reported      No    Fall or balance concerns reported     No    Tobacco Cessation  No Change    Warm-up and Cool-down  Performed as group-led instruction    Resistance Training Performed  Yes    VAD Patient?  No    PAD/SET Patient?  No      Pain Assessment   Currently in Pain?  No/denies    Multiple Pain Sites  No          Social History   Tobacco Use  Smoking Status Former Smoker  . Packs/day: 1.00  . Years: 30.00  . Pack years: 30.00  . Types: Cigarettes  . Start date: 1969  . Last attempt to quit: 1999  . Years since quitting: 21.1  Smokeless Tobacco Never Used    Goals Met:  Independence with exercise equipment Exercise tolerated well No report of cardiac concerns or symptoms Strength training completed today  Goals Unmet:  Not Applicable  Comments: Pt able to follow exercise prescription today without complaint.  Will continue to monitor for progression.    Dr. Mark Miller is Medical Director for [facility] and [facility]. 

## 2018-02-14 ENCOUNTER — Encounter: Payer: No Typology Code available for payment source | Admitting: *Deleted

## 2018-02-14 DIAGNOSIS — Z952 Presence of prosthetic heart valve: Secondary | ICD-10-CM

## 2018-02-14 DIAGNOSIS — I350 Nonrheumatic aortic (valve) stenosis: Secondary | ICD-10-CM | POA: Diagnosis not present

## 2018-02-14 NOTE — Progress Notes (Signed)
Daily Session Note  Patient Details  Name: Alex Gomez MRN: [id number] Date of Birth: November 29, 1945 Referring Provider:     Cardiac Rehab from 02/03/2018 in [facility] Cardiac and Pulmonary Rehab  Referring Provider  Diona Browner MD      Encounter Date: 02/14/2018  Check In: Session Check In - 02/14/18 0827      Check-In   Supervising physician immediately available to respond to emergencies  See telemetry face sheet for immediately available ER MD    Location  [facility]Cardiac [facility] Pulmonary Rehab    Staff Present  Renita Papa, RN BSN;Jeanna Durrell BS, Exercise Physiologist;Jesaiah Fabiano Luan Pulling, MA, RCEP, CCRP, Exercise Physiologist    Medication changes reported      No    Fall or balance concerns reported     No    Warm-up and Cool-down  Performed as group-led instruction    Resistance Training Performed  Yes    VAD Patient?  No    PAD/SET Patient?  No      Pain Assessment   Currently in Pain?  No/denies          Social History   Tobacco Use  Smoking Status Former Smoker  . Packs/day: 1.00  . Years: 30.00  . Pack years: 30.00  . Types: Cigarettes  . Start date: 75  . Last attempt to quit: 1999  . Years since quitting: 21.1  Smokeless Tobacco Never Used    Goals Met:  Independence with exercise equipment Exercise tolerated well No report of cardiac concerns or symptoms Strength training completed today  Goals Unmet:  Not Applicable  Comments: Pt able to follow exercise prescription today without complaint.  Will continue to monitor for progression.    Dr. Emily Filbert is Medical Director for [place] and [facility].

## 2018-02-17 ENCOUNTER — Encounter: Payer: No Typology Code available for payment source | Admitting: *Deleted

## 2018-02-17 DIAGNOSIS — I350 Nonrheumatic aortic (valve) stenosis: Secondary | ICD-10-CM | POA: Diagnosis not present

## 2018-02-17 DIAGNOSIS — Z952 Presence of prosthetic heart valve: Secondary | ICD-10-CM

## 2018-02-17 NOTE — Progress Notes (Signed)
Daily Session Note  Patient Details  Name: Alex Gomez MRN: [id number] Date of Birth: 07/13/1945 Referring Provider:     Cardiac Rehab from 02/03/2018 in [facility] Cardiac and Pulmonary Rehab  Referring Provider  Schwartz, Adam MD      Encounter Date: 02/17/2018  Check In: Session Check In - 02/17/18 0745      Check-In   Supervising physician immediately available to respond to emergencies  See telemetry face sheet for immediately available ER MD    Location  [facility]-Cardiac [facility] Pulmonary Rehab    Staff Present   , BS, ACSM CEP, Exercise Physiologist;Jessica Hawkins, MA, RCEP, CCRP, Exercise Physiologist;Susanne Bice, RN, BSN, CCRP    Medication changes reported      No    Fall or balance concerns reported     No    Tobacco Cessation  No Change    Warm-up and Cool-down  Performed as group-led instruction    Resistance Training Performed  Yes    VAD Patient?  No    PAD/SET Patient?  No      Pain Assessment   Currently in Pain?  No/denies    Multiple Pain Sites  No          Social History   Tobacco Use  Smoking Status Former Smoker  . Packs/day: 1.00  . Years: 30.00  . Pack years: 30.00  . Types: Cigarettes  . Start date: 1969  . Last attempt to quit: 1999  . Years since quitting: 21.1  Smokeless Tobacco Never Used    Goals Met:  Independence with exercise equipment Exercise tolerated well No report of cardiac concerns or symptoms Strength training completed today  Goals Unmet:  Not Applicable  Comments: Pt able to follow exercise prescription today without complaint.  Will continue to monitor for progression. Reviewed home exercise with pt today.  Pt plans to walking at home for exercise.  Reviewed THR, pulse, RPE, sign and symptoms, and when to call 911 or MD.  Also discussed weather considerations and indoor options.  Pt voiced understanding.   Dr. Mark Miller is Medical Director for [facility] and [facility]. 

## 2018-02-19 DIAGNOSIS — I350 Nonrheumatic aortic (valve) stenosis: Secondary | ICD-10-CM | POA: Diagnosis not present

## 2018-02-19 DIAGNOSIS — Z952 Presence of prosthetic heart valve: Secondary | ICD-10-CM

## 2018-02-19 NOTE — Progress Notes (Signed)
Daily Session Note  Patient Details  Name: JB DULWORTH MRN: [id number] Date of Birth: December 17, 1945 Referring Provider:     Cardiac Rehab from 02/03/2018 in [facility] Cardiac and Pulmonary Rehab  Referring Provider  Diona Browner MD      Encounter Date: 02/19/2018  Check In: Session Check In - 02/19/18 0743      Check-In   Supervising physician immediately available to respond to emergencies  See telemetry face sheet for immediately available ER MD    Location  [facility]-Cardiac & Pulmonary Rehab    Staff Present  Alberteen Sam, MA, RCEP, CCRP, Exercise Physiologist;Joseph [place];Heath Lark, RN, BSN, CCRP    Medication changes reported      No    Fall or balance concerns reported     No    Warm-up and Cool-down  Performed as group-led instruction    VAD Patient?  No    PAD/SET Patient?  No      Pain Assessment   Currently in Pain?  No/denies          Social History   Tobacco Use  Smoking Status Former Smoker  . Packs/day: 1.00  . Years: 30.00  . Pack years: 30.00  . Types: Cigarettes  . Start date: 50  . Last attempt to quit: 1999  . Years since quitting: 21.1  Smokeless Tobacco Never Used    Goals Met:  Independence with exercise equipment Exercise tolerated well No report of cardiac concerns or symptoms Strength training completed today  Goals Unmet:  Not Applicable  Comments: Pt able to follow exercise prescription today without complaint.  Will continue to monitor for progression.    Dr. Emily Filbert is Medical Director for [place] and [facility].

## 2018-02-24 ENCOUNTER — Encounter: Payer: No Typology Code available for payment source | Admitting: *Deleted

## 2018-02-24 DIAGNOSIS — Z952 Presence of prosthetic heart valve: Secondary | ICD-10-CM

## 2018-02-24 DIAGNOSIS — I350 Nonrheumatic aortic (valve) stenosis: Secondary | ICD-10-CM | POA: Diagnosis not present

## 2018-02-24 NOTE — Progress Notes (Signed)
Daily Session Note  Patient Details  Name: Alex Gomez MRN: [id number] Date of Birth: 1945/10/14 Referring Provider:     Cardiac Rehab from 02/03/2018 in [facility] Cardiac and Pulmonary Rehab  Referring Provider  Diona Browner MD      Encounter Date: 02/24/2018  Check In: Session Check In - 02/24/18 0758      Check-In   Supervising physician immediately available to respond to emergencies  See telemetry face sheet for immediately available ER MD    Location  [facility]-Cardiac [facility] Pulmonary Rehab    Staff Present  Earlean Shawl, BS, ACSM CEP, Exercise Physiologist;Jessica Luan Pulling, MA, RCEP, CCRP, Exercise Physiologist;Susanne Bice, RN, BSN, CCRP    Medication changes reported      No    Fall or balance concerns reported     No    Tobacco Cessation  No Change    Warm-up and Cool-down  Performed as group-led instruction    Resistance Training Performed  Yes    VAD Patient?  No    PAD/SET Patient?  No      Pain Assessment   Currently in Pain?  No/denies    Multiple Pain Sites  No          Social History   Tobacco Use  Smoking Status Former Smoker  . Packs/day: 1.00  . Years: 30.00  . Pack years: 30.00  . Types: Cigarettes  . Start date: 52  . Last attempt to quit: 1999  . Years since quitting: 21.1  Smokeless Tobacco Never Used    Goals Met:  Independence with exercise equipment Exercise tolerated well No report of cardiac concerns or symptoms Strength training completed today  Goals Unmet:  Not Applicable  Comments: Pt able to follow exercise prescription today without complaint.  Will continue to monitor for progression.    Dr. Emily Filbert is Medical Director for [place] and [facility].

## 2018-02-26 ENCOUNTER — Encounter: Payer: Self-pay | Admitting: *Deleted

## 2018-02-26 DIAGNOSIS — Z952 Presence of prosthetic heart valve: Secondary | ICD-10-CM

## 2018-02-26 DIAGNOSIS — I350 Nonrheumatic aortic (valve) stenosis: Secondary | ICD-10-CM | POA: Diagnosis not present

## 2018-02-26 NOTE — Progress Notes (Signed)
Daily Session Note  Patient Details  Name: ESPIRIDION Gomez MRN: [id number] Date of Birth: [birth year] Referring Provider:     Cardiac Rehab from 02/03/2018 in [facility] Cardiac and Pulmonary Rehab  Referring Provider  Diona Browner MD      Encounter Date: 02/26/2018  Check In: Session Check In - 02/26/18 0744      Check-In   Supervising physician immediately available to respond to emergencies  See telemetry face sheet for immediately available ER MD    Location  [facility]Cardiac [facility] Pulmonary Rehab    Staff Present  Justin Mend Lorre Nick, MA, RCEP, CCRP, Exercise Physiologist;Susanne Bice, RN, BSN, CCRP    Medication changes reported      No    Fall or balance concerns reported     No    Warm-up and Cool-down  Performed as group-led instruction    Resistance Training Performed  Yes    VAD Patient?  No    PAD/SET Patient?  No      Pain Assessment   Currently in Pain?  No/denies          Social History   Tobacco Use  Smoking Status Former Smoker  . Packs/day: 1.00  . Years: 30.00  . Pack years: 30.00  . Types: Cigarettes  . Start date: 53  . Last attempt to quit: 1999  . Years since quitting: 21.1  Smokeless Tobacco Never Used    Goals Met:  Independence with exercise equipment Exercise tolerated well No report of cardiac concerns or symptoms Strength training completed today  Goals Unmet:  Not Applicable  Comments: Pt able to follow exercise prescription today without complaint.  Will continue to monitor for progression.    Dr. Emily Filbert is Medical Director for [place] and [facility].

## 2018-02-26 NOTE — Progress Notes (Signed)
Cardiac Individual Treatment Plan  Patient Details  Name: Alex Gomez MRN: [id number] Date of Birth: May 28, 1945 Referring Provider:     Cardiac Rehab from 02/03/2018 in [facility] Cardiac and Pulmonary Rehab  Referring Provider  Diona Browner MD      Initial Encounter Date:    Cardiac Rehab from 02/03/2018 in [facility] Cardiac and Pulmonary Rehab  Date  02/03/18      Visit Diagnosis: S/P AVR (aortic valve replacement)  Patient's Home Medications on Admission:  Current Outpatient Medications:  .  amLODipine (NORVASC) 10 MG tablet, Take by mouth., Disp: , Rfl:  .  aspirin EC 81 MG tablet, Take by mouth., Disp: , Rfl:  .  benzonatate (TESSALON) 200 MG capsule, Take 200 mg by mouth at bedtime as needed for cough., Disp: , Rfl:  .  carboxymethylcellulose (REFRESH PLUS) 0.5 % SOLN, 1 drop 3 (three) times daily as needed., Disp: , Rfl:  .  Cholecalciferol 25 MCG (1000 UT) tablet, Take 1,000 Units by mouth daily., Disp: , Rfl:  .  finasteride (PROSCAR) 5 MG tablet, Take by mouth., Disp: , Rfl:  .  fluticasone (FLONASE) 50 MCG/ACT nasal spray, Place into the nose., Disp: , Rfl:  .  furosemide (LASIX) 40 MG tablet, Take by mouth., Disp: , Rfl:  .  guaiFENesin-codeine 100-10 MG/5ML syrup, Take by mouth., Disp: , Rfl:  .  oxybutynin (DITROPAN-XL) 5 MG 24 hr tablet, Take by mouth., Disp: , Rfl:  .  prazosin (MINIPRESS) 5 MG capsule, Take by mouth., Disp: , Rfl:  .  sennosides-docusate sodium (SENOKOT-S) 8.6-50 MG tablet, Take 2 tablets by mouth daily., Disp: , Rfl:  .  triamcinolone ointment (KENALOG) 0.1 %, Frequency:BID   Dosage:0.0     Instructions:  Note:Dose: 0.1%, Disp: , Rfl:  .  venlafaxine XR (EFFEXOR-XR) 150 MG 24 hr capsule, Take by mouth., Disp: , Rfl:  .  zolpidem (AMBIEN CR) 12.5 MG CR tablet, Take by mouth., Disp: , Rfl:   Past Medical History: No past medical history on file.  Tobacco Use: Social History   Tobacco Use  Smoking Status Former Smoker  . Packs/day: 1.00  . Years:  30.00  . Pack years: 30.00  . Types: Cigarettes  . Start date: 10  . Last attempt to quit: 1999  . Years since quitting: 21.1  Smokeless Tobacco Never Used    Labs: Recent Review Flowsheet Data    There is no flowsheet data to display.       Exercise Target Goals: Exercise Program Goal: Individual exercise prescription set using results from initial 6 min walk test and THRR while considering  patient's activity barriers and safety.   Exercise Prescription Goal: Initial exercise prescription builds to 30-45 minutes a day of aerobic activity, 2-3 days per week.  Home exercise guidelines will be given to patient during program as part of exercise prescription that the participant will acknowledge.  Activity Barriers & Risk Stratification: Activity Barriers & Cardiac Risk Stratification - 02/03/18 1342      Activity Barriers & Cardiac Risk Stratification   Activity Barriers  Joint Problems;Deconditioning;Muscular Weakness;Balance Concerns   Occassional knee pain   Cardiac Risk Stratification  High       6 Minute Walk: 6 Minute Walk    Row Name 02/03/18 1341         6 Minute Walk   Phase  Initial     Distance  1400 feet     Walk Time  6 minutes     #  of Rest Breaks  0     MPH  2.65     METS  11.26     RPE  11     VO2 Peak  3.22     Symptoms  No     Resting HR  78 bpm     Resting BP  134/66     Resting Oxygen Saturation   95 %     Exercise Oxygen Saturation  during 6 min walk  97 %     Max Ex. HR  114 bpm     Max Ex. BP  154/82     2 Minute Post BP  122/64        Oxygen Initial Assessment:   Oxygen Re-Evaluation:   Oxygen Discharge (Final Oxygen Re-Evaluation):   Initial Exercise Prescription: Initial Exercise Prescription - 02/03/18 1300      Date of Initial Exercise RX and Referring Provider   Date  02/03/18    Referring Provider  Diona Browner MD      Treadmill   MPH  2.5    Grade  0.5    Minutes  15    METs  3.09      Recumbant Bike    Level  3    RPM  50    Watts  41    Minutes  15    METs  3      T5 Nustep   Level  3    SPM  80    Minutes  15    METs  3      Prescription Details   Frequency (times per week)  3    Duration  Progress to 45 minutes of aerobic exercise without signs/symptoms of physical distress      Intensity   THRR 40-80% of Max Heartrate  107-136    Ratings of Perceived Exertion  11-13    Perceived Dyspnea  0-4      Progression   Progression  Continue to progress workloads to maintain intensity without signs/symptoms of physical distress.      Resistance Training   Training Prescription  Yes    Weight  3 lbs    Reps  10-15       Perform Capillary Blood Glucose checks as needed.  Exercise Prescription Changes: Exercise Prescription Changes    Row Name 02/03/18 1300 02/19/18 1700           Response to Exercise   Blood Pressure (Admit)  134/66  130/60      Blood Pressure (Exercise)  154/82  122/68      Blood Pressure (Exit)  122/64  116/60      Heart Rate (Admit)  78 bpm  72 bpm      Heart Rate (Exercise)  114 bpm  120 bpm      Heart Rate (Exit)  83 bpm  89 bpm      Oxygen Saturation (Admit)  95 %  -      Oxygen Saturation (Exercise)  97 %  -      Rating of Perceived Exertion (Exercise)  11  15      Symptoms  none  none      Comments  walk test results  -      Duration  -  Progress to 45 minutes of aerobic exercise without signs/symptoms of physical distress takes rest breaks on treadmill      Intensity  -  THRR unchanged  Progression   Progression  -  Continue to progress workloads to maintain intensity without signs/symptoms of physical distress.      Average METs  -  2.61        Resistance Training   Training Prescription  -  Yes      Weight  -  5 lbs      Reps  -  10-15        Interval Training   Interval Training  -  No        Treadmill   MPH  -  2.5      Grade  -  0.5      Minutes  -  15 occasional rest breaks or stops early      METs  -  3.09         Recumbant Bike   Level  -  3      Watts  -  18      Minutes  -  15      METs  -  2.76        T5 Nustep   Level  -  4      Minutes  -  15      METs  -  2        Home Exercise Plan   Plans to continue exercise at  -  Home (comment) walking      Frequency  -  Add 2 additional days to program exercise sessions.      Initial Home Exercises Provided  -  02/17/18         Exercise Comments: Exercise Comments    Row Name 02/07/18 0736           Exercise Comments  First full day of exercise!  Patient was oriented to gym and equipment including functions, settings, policies, and procedures.  Patient's individual exercise prescription and treatment plan were reviewed.  All starting workloads were established based on the results of the 6 minute walk test done at initial orientation visit.  The plan for exercise progression was also introduced and progression will be customized based on patient's performance and goals.          Exercise Goals and Review: Exercise Goals    Row Name 02/03/18 1345             Exercise Goals   Increase Physical Activity  Yes       Intervention  Provide advice, education, support and counseling about physical activity/exercise needs.;Develop an individualized exercise prescription for aerobic and resistive training based on initial evaluation findings, risk stratification, comorbidities and participant's personal goals.       Expected Outcomes  Short Term: Attend rehab on a regular basis to increase amount of physical activity.;Long Term: Add in home exercise to make exercise part of routine and to increase amount of physical activity.;Long Term: Exercising regularly at least 3-5 days a week.       Increase Strength and Stamina  Yes       Intervention  Develop an individualized exercise prescription for aerobic and resistive training based on initial evaluation findings, risk stratification, comorbidities and participant's personal goals.;Provide advice,  education, support and counseling about physical activity/exercise needs.       Expected Outcomes  Short Term: Increase workloads from initial exercise prescription for resistance, speed, and METs.;Short Term: Perform resistance training exercises routinely during rehab and add in resistance training at home;Long Term: Improve cardiorespiratory fitness, muscular  endurance and strength as measured by increased METs and functional capacity (6MWT)       Able to understand and use rate of perceived exertion (RPE) scale  Yes       Intervention  Provide education and explanation on how to use RPE scale       Expected Outcomes  Long Term:  Able to use RPE to guide intensity level when exercising independently;Short Term: Able to use RPE daily in rehab to express subjective intensity level       Knowledge and understanding of Target Heart Rate Range (THRR)  Yes       Intervention  Provide education and explanation of THRR including how the numbers were predicted and where they are located for reference       Expected Outcomes  Short Term: Able to state/look up THRR;Long Term: Able to use THRR to govern intensity when exercising independently;Short Term: Able to use daily as guideline for intensity in rehab       Able to check pulse independently  Yes       Intervention  Provide education and demonstration on how to check pulse in carotid and radial arteries.;Review the importance of being able to check your own pulse for safety during independent exercise       Expected Outcomes  Short Term: Able to explain why pulse checking is important during independent exercise;Long Term: Able to check pulse independently and accurately       Understanding of Exercise Prescription  Yes       Intervention  Provide education, explanation, and written materials on patient's individual exercise prescription       Expected Outcomes  Short Term: Able to explain program exercise prescription;Long Term: Able to explain home  exercise prescription to exercise independently          Exercise Goals Re-Evaluation : Exercise Goals Re-Evaluation    Row Name 02/07/18 0736 02/17/18 0846           Exercise Goal Re-Evaluation   Exercise Goals Review  Increase Physical Activity;Increase Strength and Stamina;Able to understand and use rate of perceived exertion (RPE) scale;Knowledge and understanding of Target Heart Rate Range (THRR);Understanding of Exercise Prescription  Increase Physical Activity;Increase Strength and Stamina;Understanding of Exercise Prescription      Comments  Reviewed RPE scale, THR and program prescription with pt today.  Pt voiced understanding and was given a copy of goals to take home.   Alex Gomez is doing well in rehab.  He feels like his strength and stamina is getting better.  His first day is rough, but since then he has been doing a lot better. Reviewed home exercise with pt today.  Pt plans to walking at home for exercise.  Reviewed THR, pulse, RPE, sign and symptoms, and when to call 911 or MD.  Also discussed weather considerations and indoor options.  Pt voiced understanding.      Expected Outcomes  Short: Use RPE daily to regulate intensity. Long: Follow program prescription in THR.  Short: Start to walk at home on off days.  Long: Continue to increase strength and stamina.          Discharge Exercise Prescription (Final Exercise Prescription Changes): Exercise Prescription Changes - 02/19/18 1700      Response to Exercise   Blood Pressure (Admit)  130/60    Blood Pressure (Exercise)  122/68    Blood Pressure (Exit)  116/60    Heart Rate (Admit)  72 bpm    Heart  Rate (Exercise)  120 bpm    Heart Rate (Exit)  89 bpm    Rating of Perceived Exertion (Exercise)  15    Symptoms  none    Duration  Progress to 45 minutes of aerobic exercise without signs/symptoms of physical distress   takes rest breaks on treadmill   Intensity  THRR unchanged      Progression   Progression  Continue to  progress workloads to maintain intensity without signs/symptoms of physical distress.    Average METs  2.61      Resistance Training   Training Prescription  Yes    Weight  5 lbs    Reps  10-15      Interval Training   Interval Training  No      Treadmill   MPH  2.5    Grade  0.5    Minutes  15   occasional rest breaks or stops early   METs  3.09      Recumbant Bike   Level  3    Watts  18    Minutes  15    METs  2.76      T5 Nustep   Level  4    Minutes  15    METs  2      Home Exercise Plan   Plans to continue exercise at  Home (comment)   walking   Frequency  Add 2 additional days to program exercise sessions.    Initial Home Exercises Provided  02/17/18       Nutrition:  Target Goals: Understanding of nutrition guidelines, daily intake of sodium '1500mg'$ , cholesterol '200mg'$ , calories 30% from fat and 7% or less from saturated fats, daily to have 5 or more servings of fruits and vegetables.  Biometrics: Pre Biometrics - 02/03/18 1345      Pre Biometrics   Height  6' 0.6" (1.844 m)    Weight  236 lb (107 kg)    Waist Circumference  41 inches    Hip Circumference  42 inches    Waist to Hip Ratio  0.98 %    BMI (Calculated)  31.48    Single Leg Stand  2.96 seconds        Nutrition Therapy Plan and Nutrition Goals: Nutrition Therapy & Goals - 02/03/18 1451      Intervention Plan   Intervention  Prescribe, educate and counsel regarding individualized specific dietary modifications aiming towards targeted core components such as weight, hypertension, lipid management, diabetes, heart failure and other comorbidities.;Nutrition handout(s) given to patient.    Expected Outcomes  Short Term Goal: Understand basic principles of dietary content, such as calories, fat, sodium, cholesterol and nutrients.;Long Term Goal: Adherence to prescribed nutrition plan.;Short Term Goal: A plan has been developed with personal nutrition goals set during dietitian appointment.        Nutrition Assessments: Nutrition Assessments - 02/03/18 1451      MEDFICTS Scores   Pre Score  65       Nutrition Goals Re-Evaluation: Nutrition Goals Re-Evaluation    Row Name 02/17/18 0841             Goals   Nutrition Goal  Heart healthy, low fat, low salt, more fruits and vegetables       Comment  Alex Gomez has low fat yogurt for breakfast with fruit.  He goes to [employer] for shakes for lunch.  He is eating fish for dinner and baked potato and vegetables.    He  is trying to aim for 6-8 servings a day.  He continues to watch his sodium content.  He has not been reading food labels but will start to look at them more often.  Overall, he feels that he off to good start on his diet.  Continue to stick with it.  He has not been drinking a lot of water . He usually has 1/2 tea, but knows he needs to increase his water.  He has two beers a days.        Expected Outcome  Short: Switch to drinking more water.  Long: Meet with nutritionist in March.           Nutrition Goals Discharge (Final Nutrition Goals Re-Evaluation): Nutrition Goals Re-Evaluation - 02/17/18 0841      Goals   Nutrition Goal  Heart healthy, low fat, low salt, more fruits and vegetables    Comment  Alex Gomez has low fat yogurt for breakfast with fruit.  He goes to [employer] for shakes for lunch.  He is eating fish for dinner and baked potato and vegetables.    He is trying to aim for 6-8 servings a day.  He continues to watch his sodium content.  He has not been reading food labels but will start to look at them more often.  Overall, he feels that he off to good start on his diet.  Continue to stick with it.  He has not been drinking a lot of water . He usually has 1/2 tea, but knows he needs to increase his water.  He has two beers a days.     Expected Outcome  Short: Switch to drinking more water.  Long: Meet with nutritionist in March.        Psychosocial: Target Goals: Acknowledge presence or  absence of significant depression and/or stress, maximize coping skills, provide positive support system. Participant is able to verbalize types and ability to use techniques and skills needed for reducing stress and depression.   Initial Review & Psychosocial Screening: Initial Psych Review & Screening - 02/03/18 1448      Initial Review   Current issues with  Current Psychotropic Meds;Current Stress Concerns;Current Sleep Concerns    Source of Stress Concerns  Unable to perform yard/household activities    Comments  Otila Kluver is on disability through the [place] for PTSD. He is on medication, but can get startled if someone comes up from behind him. He reports sleeping better once his doctor started him on [place]. 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 He was used to staying active, so he tries to stay connected to the community by participating in different historical events. He loves history and takes pride in his roots.       Family Dynamics   Good Support System?  Yes   wife     Barriers   Psychosocial barriers to participate in program  There are no identifiable barriers or psychosocial needs.;The patient should benefit from training in stress management and relaxation.      Screening Interventions   Interventions  Encouraged to exercise;Program counselor consult;To provide support and resources with identified psychosocial needs;Provide feedback about the scores to participant    Expected Outcomes  Short Term goal: Utilizing psychosocial counselor, staff and physician to assist with identification of specific Stressors or current issues interfering with healing process. Setting desired goal for each stressor or current issue identified.;Long Term Goal: Stressors or current issues are controlled or  eliminated.;Short Term goal: Identification and review with participant of any Quality of Life or Depression concerns found by scoring the questionnaire.;Long Term  goal: The participant improves quality of Life and PHQ9 Scores as seen by post scores and/or verbalization of changes       Quality of Life Scores:  Quality of Life - 02/03/18 1451      Quality of Life   Select  Quality of Life      Quality of Life Scores   Health/Function Pre  29.03 %    Socioeconomic Pre  28.79 %    Psych/Spiritual Pre  30 %    Family Pre  30 %    GLOBAL Pre  29.3 %      Scores of 19 and below usually indicate a poorer quality of life in these areas.  A difference of  2-3 points is a clinically meaningful difference.  A difference of 2-3 points in the total score of the Quality of Life Index has been associated with significant improvement in overall quality of life, self-image, physical symptoms, and general health in studies assessing change in quality of life.  PHQ-9: Recent Review Flowsheet Data    Depression screen [facility] 2/9 02/03/2018   Decreased Interest 0   Down, Depressed, Hopeless 1   PHQ - 2 Score 1   Altered sleeping 0   Tired, decreased energy 1   Change in appetite 0   Feeling bad or failure about yourself  1   Trouble concentrating 0   Moving slowly or fidgety/restless 1   Suicidal thoughts 0   PHQ-9 Score 4   Difficult doing work/chores Not difficult at all     Interpretation of Total Score  Total Score Depression Severity:  1-4 = Minimal depression, 5-9 = Mild depression, 10-14 = Moderate depression, 15-19 = Moderately severe depression, 20-27 = Severe depression   Psychosocial Evaluation and Intervention: Psychosocial Evaluation - 02/10/18 0947      Psychosocial Evaluation & Interventions   Interventions  Stress management education;Relaxation education;Encouraged to exercise with the program and follow exercise prescription    Comments  Counselor met with Alex Gomez) today for initial psychosocial evaluation.  He is a 73 year old who had an aaorta Valve replacement in November, 2019.  He has a strong suport system with a spouse of  12 years; friends and active involvement in his local church.  He reports being in fairly good health overall with sleeping well; a good appetite and typically in a positive mood.  He has a history of PTSD subsequent to serving in [place] and he is under the care of a psychiatrist and has attended groups for this as well.  Alex Gomez also takes medications to help him sleep and for his PTSD and reports these work well for him.  He has some stress in his life with "worrying about what others think" often.  He has worked on this with a Social worker in the past.  Alex Gomez has goals for increasing his stamina and strength; being more heart healthy; and to improve his mental/emotional state as well.  He will be followed by staff.    Expected Outcomes  Short:  Alex Gomez will exercise consistently to increase his stamina and strength and improve his mental health.  He will continue to be followed by the [facility] for his mental health needs.  Long:  Alex Gomez will develop a routine of exercise for his health and mental health.      Continue Psychosocial Services  Follow up required by staff       Psychosocial Re-Evaluation:   Psychosocial Discharge (Final Psychosocial Re-Evaluation):   Vocational Rehabilitation: Provide vocational rehab assistance to qualifying candidates.   Vocational Rehab Evaluation & Intervention: Vocational Rehab - 02/03/18 1448      Initial Vocational Rehab Evaluation & Intervention   Assessment shows need for Vocational Rehabilitation  No       Education: Education Goals: Education classes will be provided on a variety of topics geared toward better understanding of heart health and risk factor modification. Participant will state understanding/return demonstration of topics presented as noted by education test scores.  Learning Barriers/Preferences: Learning Barriers/Preferences - 02/03/18 1445      Learning Barriers/Preferences   Learning Barriers  Hearing    Learning Preferences   Individual Instruction;Verbal Instruction       Education Topics:  AED/CPR: - Group verbal and written instruction with the use of models to demonstrate the basic use of the AED with the basic ABC's of resuscitation.   Cardiac Rehab from 02/24/2018 in [facility] Cardiac and Pulmonary Rehab  Date  02/24/18  Educator  SB  Instruction Review Code  1- Verbalizes Understanding      General Nutrition Guidelines/Fats and Fiber: -Group instruction provided by verbal, written material, models and posters to present the general guidelines for heart healthy nutrition. Gives an explanation and review of dietary fats and fiber.   Controlling Sodium/Reading Food Labels: -Group verbal and written material supporting the discussion of sodium use in heart healthy nutrition. Review and explanation with models, verbal and written materials for utilization of the food label.   Exercise Physiology & General Exercise Guidelines: - Group verbal and written instruction with models to review the exercise physiology of the cardiovascular system and associated critical values. Provides general exercise guidelines with specific guidelines to those with heart or lung disease.    Aerobic Exercise & Resistance Training: - Gives group verbal and written instruction on the various components of exercise. Focuses on aerobic and resistive training programs and the benefits of this training and how to safely progress through these programs..   Flexibility, Balance, Mind/Body Relaxation: Provides group verbal/written instruction on the benefits of flexibility and balance training, including mind/body exercise modes such as yoga, pilates and tai chi.  Demonstration and skill practice provided.   Stress and Anxiety: - Provides group verbal and written instruction about the health risks of elevated stress and causes of high stress.  Discuss the correlation between heart/lung disease and anxiety and treatment options. Review  healthy ways to manage with stress and anxiety.   Cardiac Rehab from 02/24/2018 in [facility] Cardiac and Pulmonary Rehab  Date  02/19/18  Educator  [facility]  Instruction Review Code  1- Verbalizes Understanding      Depression: - Provides group verbal and written instruction on the correlation between heart/lung disease and depressed mood, treatment options, and the stigmas associated with seeking treatment.   Anatomy & Physiology of the Heart: - Group verbal and written instruction and models provide basic cardiac anatomy and physiology, with the coronary electrical and arterial systems. Review of Valvular disease and Heart Failure   Cardiac Procedures: - Group verbal and written instruction to review commonly prescribed medications for heart disease. Reviews the medication, class of the drug, and side effects. Includes the steps to properly store meds and maintain the prescription regimen. (beta blockers and nitrates)   Cardiac Rehab from 02/24/2018 in [facility] Cardiac and Pulmonary Rehab  Date  02/17/18  Educator  SB  Instruction Review Code  1- Verbalizes Understanding      Cardiac Medications I: - Group verbal and written instruction to review commonly prescribed medications for heart disease. Reviews the medication, class of the drug, and side effects. Includes the steps to properly store meds and maintain the prescription regimen.   Cardiac Rehab from 02/24/2018 in [facility] Cardiac and Pulmonary Rehab  Date  02/10/18  Educator  SB  Instruction Review Code  1- Verbalizes Understanding      Cardiac Medications II: -Group verbal and written instruction to review commonly prescribed medications for heart disease. Reviews the medication, class of the drug, and side effects. (all other drug classes)    Go Sex-Intimacy & Heart Disease, Get SMART - Goal Setting: - Group verbal and written instruction through game format to discuss heart disease and the return to sexual intimacy. Provides group  verbal and written material to discuss and apply goal setting through the application of the S.M.A.R.T. Method.   Cardiac Rehab from 02/24/2018 in [facility] Cardiac and Pulmonary Rehab  Date  02/17/18  Educator  SB  Instruction Review Code  1- Verbalizes Understanding      Other Matters of the Heart: - Provides group verbal, written materials and models to describe Stable Angina and Peripheral Artery. Includes description of the disease process and treatment options available to the cardiac patient.   Exercise & Equipment Safety: - Individual verbal instruction and demonstration of equipment use and safety with use of the equipment.   Cardiac Rehab from 02/24/2018 in [facility] Cardiac and Pulmonary Rehab  Date  02/03/18  Educator  [facility]  Instruction Review Code  1- Verbalizes Understanding      Infection Prevention: - Provides verbal and written material to individual with discussion of infection control including proper hand washing and proper equipment cleaning during exercise session.   Cardiac Rehab from 02/24/2018 in [facility] Cardiac and Pulmonary Rehab  Date  02/03/18  Educator  [facility]  Instruction Review Code  1- Verbalizes Understanding      Falls Prevention: - Provides verbal and written material to individual with discussion of falls prevention and safety.   Cardiac Rehab from 02/24/2018 in [facility] Cardiac and Pulmonary Rehab  Date  02/03/18  Educator  [employer]  Instruction Review Code  1- Verbalizes Understanding      Diabetes: - Individual verbal and written instruction to review signs/symptoms of diabetes, desired ranges of glucose level fasting, after meals and with exercise. Acknowledge that pre and post exercise glucose checks will be done for 3 sessions at entry of program.   Know Your Numbers and Risk Factors: -Group verbal and written instruction about important numbers in your health.  Discussion of what are risk factors and how they play a role in the disease process.  Review of  Cholesterol, Blood Pressure, Diabetes, and BMI and the role they play in your overall health.   Sleep Hygiene: -Provides group verbal and written instruction about how sleep can affect your health.  Define sleep hygiene, discuss sleep cycles and impact of sleep habits. Review good sleep hygiene tips.    Other: -Provides group and verbal instruction on various topics (see comments)   Knowledge Questionnaire Score: Knowledge Questionnaire Score - 02/03/18 1445      Knowledge Questionnaire Score   Pre Score  23/26       Core Components/Risk Factors/Patient Goals at Admission: Personal Goals and Risk Factors at Admission - 02/03/18 1444      Core Components/Risk Factors/Patient Goals on  Admission    Weight Management  Yes;Weight Loss    Intervention  Weight Management: Develop a combined nutrition and exercise program designed to reach desired caloric intake, while maintaining appropriate intake of nutrient and fiber, sodium and fats, and appropriate energy expenditure required for the weight goal.;Weight Management: Provide education and appropriate resources to help participant work on and attain dietary goals.;Weight Management/Obesity: Establish reasonable short term and long term weight goals.    Admit Weight  236 lb (107 kg)    Goal Weight: Short Term  232 lb (105.2 kg)    Goal Weight: Long Term  225 lb (102.1 kg)    Expected Outcomes  Short Term: Continue to assess and modify interventions until short term weight is achieved;Long Term: Adherence to nutrition and physical activity/exercise program aimed toward attainment of established weight goal;Weight Loss: Understanding of general recommendations for a balanced deficit meal plan, which promotes 1-2 lb weight loss per week and includes a negative energy balance of [phone] kcal/d;Understanding recommendations for meals to include 15-35% energy as protein, 25-35% energy from fat, 35-60% energy from carbohydrates, less than '200mg'$  of  dietary cholesterol, 20-35 gm of total fiber daily;Understanding of distribution of calorie intake throughout the day with the consumption of 4-5 meals/snacks    Hypertension  Yes    Intervention  Provide education on lifestyle modifcations including regular physical activity/exercise, weight management, moderate sodium restriction and increased consumption of fresh fruit, vegetables, and low fat dairy, alcohol moderation, and smoking cessation.;Monitor prescription use compliance.    Expected Outcomes  Short Term: Continued assessment and intervention until BP is < 140/36m HG in hypertensive participants. < 130/895mHG in hypertensive participants with diabetes, heart failure or chronic kidney disease.;Long Term: Maintenance of blood pressure at goal levels.    Lipids  Yes    Intervention  Provide education and support for participant on nutrition & aerobic/resistive exercise along with prescribed medications to achieve LDL '70mg'$ , HDL >'40mg'$ .    Expected Outcomes  Short Term: Participant states understanding of desired cholesterol values and is compliant with medications prescribed. Participant is following exercise prescription and nutrition guidelines.;Long Term: Cholesterol controlled with medications as prescribed, with individualized exercise RX and with personalized nutrition plan. Value goals: LDL < '70mg'$ , HDL > 40 mg.       Core Components/Risk Factors/Patient Goals Review:  Goals and Risk Factor Review    Row Name 02/17/18 [id number]           Core Components/Risk Factors/Patient Goals Review   Personal Goals Review  Weight Management/Obesity;Hypertension;Lipids       Review  StRichardson Landrys off to a good start in rehab.  He is already starting to lose weight. We talked about how working on his diet and adding home exercise will help with the weight loss too.  He is doing well with his blood pressures in class.  He has not been checking them at home, but will start and keep a log of it to take to  doctors office.   Overall, he feels that his medications are working well for him.         Expected Outcomes  Short: Start to take blood pressures more at home.  Long: Continue to work on weight loss.           Core Components/Risk Factors/Patient Goals at Discharge (Final Review):  Goals and Risk Factor Review - 02/17/18 0838      Core Components/Risk Factors/Patient Goals Review   Personal Goals Review  Weight Management/Obesity;Hypertension;Lipids    Review  Alex Gomez is off to a good start in rehab.  He is already starting to lose weight. We talked about how working on his diet and adding home exercise will help with the weight loss too.  He is doing well with his blood pressures in class.  He has not been checking them at home, but will start and keep a log of it to take to doctors office.   Overall, he feels that his medications are working well for him.      Expected Outcomes  Short: Start to take blood pressures more at home.  Long: Continue to work on weight loss.        ITP Comments: ITP Comments    Row Name 02/03/18 1410 02/26/18 0556         ITP Comments  Med Review completed. Initial ITP created. Diagnosis can be found in [place] paperwork under media tab  30 day review. Continue with ITP unless directed changes by Medical Director chart review.         Comments:

## 2018-02-28 ENCOUNTER — Encounter: Payer: No Typology Code available for payment source | Admitting: *Deleted

## 2018-02-28 DIAGNOSIS — I350 Nonrheumatic aortic (valve) stenosis: Secondary | ICD-10-CM | POA: Diagnosis not present

## 2018-02-28 DIAGNOSIS — Z952 Presence of prosthetic heart valve: Secondary | ICD-10-CM

## 2018-02-28 NOTE — Progress Notes (Signed)
Daily Session Note  Patient Details  Name: Alex Gomez MRN: [id number] Date of Birth: 1945-10-28 Referring Provider:     Cardiac Rehab from 02/03/2018 in [facility] Cardiac and Pulmonary Rehab  Referring Provider  Diona Browner MD      Encounter Date: 02/28/2018  Check In: Session Check In - 02/28/18 0321      Check-In   Supervising physician immediately available to respond to emergencies  See telemetry face sheet for immediately available ER MD    Location  [facility]-Cardiac & Pulmonary Rehab    Staff Present  Darel Hong, RN BSN;Meredith Sherryll Burger, RN BSN;Jessica Hawkins, MA, RCEP, CCRP, Exercise Physiologist    Medication changes reported      No    Fall or balance concerns reported     No    Tobacco Cessation  No Change    Warm-up and Cool-down  Performed as group-led instruction    Resistance Training Performed  Yes    VAD Patient?  No    PAD/SET Patient?  No      Pain Assessment   Currently in Pain?  No/denies    Multiple Pain Sites  No          Social History   Tobacco Use  Smoking Status Former Smoker  . Packs/day: 1.00  . Years: 30.00  . Pack years: 30.00  . Types: Cigarettes  . Start date: 71  . Last attempt to quit: 1999  . Years since quitting: 21.1  Smokeless Tobacco Never Used    Goals Met:  Independence with exercise equipment Exercise tolerated well No report of cardiac concerns or symptoms Strength training completed today  Goals Unmet:  Not Applicable  Comments: Pt able to follow exercise prescription today without complaint.  Will continue to monitor for progression.    Dr. Emily Filbert is Medical Director for [place] and [facility].

## 2018-03-03 ENCOUNTER — Encounter: Payer: No Typology Code available for payment source | Attending: Internal Medicine | Admitting: *Deleted

## 2018-03-03 DIAGNOSIS — I350 Nonrheumatic aortic (valve) stenosis: Secondary | ICD-10-CM | POA: Insufficient documentation

## 2018-03-03 DIAGNOSIS — Z87891 Personal history of nicotine dependence: Secondary | ICD-10-CM | POA: Insufficient documentation

## 2018-03-03 DIAGNOSIS — Z952 Presence of prosthetic heart valve: Secondary | ICD-10-CM

## 2018-03-03 NOTE — Progress Notes (Signed)
Daily Session Note  Patient Details  Name: Alex Gomez MRN: [id number] Date of Birth: 12/20/1945 Referring Provider:     Cardiac Rehab from 02/03/2018 in [facility] Cardiac and Pulmonary Rehab  Referring Provider  Schwartz, Adam MD      Encounter Date: 03/03/2018  Check In: Session Check In - 03/03/18 0759      Check-In   Supervising physician immediately available to respond to emergencies  See telemetry face sheet for immediately available ER MD    Location  [facility]-Cardiac & Pulmonary Rehab    Staff Present   , BS, ACSM CEP, Exercise Physiologist;Jessica Hawkins, MA, RCEP, CCRP, Exercise Physiologist;Susanne Bice, RN, BSN, CCRP    Medication changes reported      No    Fall or balance concerns reported     No    Tobacco Cessation  No Change    Warm-up and Cool-down  Performed as group-led instruction    Resistance Training Performed  Yes    VAD Patient?  No    PAD/SET Patient?  No      Pain Assessment   Currently in Pain?  No/denies    Multiple Pain Sites  No          Social History   Tobacco Use  Smoking Status Former Smoker  . Packs/day: 1.00  . Years: 30.00  . Pack years: 30.00  . Types: Cigarettes  . Start date: 1969  . Last attempt to quit: 1999  . Years since quitting: 21.1  Smokeless Tobacco Never Used    Goals Met:  Independence with exercise equipment Exercise tolerated well No report of cardiac concerns or symptoms Strength training completed today  Goals Unmet:  Not Applicable  Comments: Pt able to follow exercise prescription today without complaint.  Will continue to monitor for progression.    Dr. Mark Miller is Medical Director for [facility] and [facility]. 

## 2018-03-05 DIAGNOSIS — Z952 Presence of prosthetic heart valve: Secondary | ICD-10-CM

## 2018-03-05 DIAGNOSIS — I350 Nonrheumatic aortic (valve) stenosis: Secondary | ICD-10-CM | POA: Diagnosis not present

## 2018-03-05 DIAGNOSIS — Z87891 Personal history of nicotine dependence: Secondary | ICD-10-CM | POA: Diagnosis not present

## 2018-03-05 NOTE — Progress Notes (Signed)
Daily Session Note  Patient Details  Name: Alex Gomez MRN: [id number] Date of Birth: 30-Aug-1945 Referring Provider:     Cardiac Rehab from 02/03/2018 in [facility] Cardiac and Pulmonary Rehab  Referring Provider  Diona Browner MD      Encounter Date: 03/05/2018  Check In: Session Check In - 03/05/18 0851      Check-In   Supervising physician immediately available to respond to emergencies  See telemetry face sheet for immediately available ER MD    Location  [facility]-Cardiac & Pulmonary Rehab    Staff Present  Alberteen Sam, MA, RCEP, CCRP, Exercise Physiologist;Kyshaun Barnette [place] [employer];Heath Lark, RN, BSN, CCRP    Medication changes reported      No    Fall or balance concerns reported     No    Warm-up and Cool-down  Performed as group-led instruction    Resistance Training Performed  Yes    VAD Patient?  No    PAD/SET Patient?  No      Pain Assessment   Currently in Pain?  No/denies          Social History   Tobacco Use  Smoking Status Former Smoker  . Packs/day: 1.00  . Years: 30.00  . Pack years: 30.00  . Types: Cigarettes  . Start date: 51  . Last attempt to quit: 1999  . Years since quitting: 21.1  Smokeless Tobacco Never Used    Goals Met:  Independence with exercise equipment Exercise tolerated well No report of cardiac concerns or symptoms Strength training completed today  Goals Unmet:  Not Applicable  Comments: Pt able to follow exercise prescription today without complaint.  Will continue to monitor for progression.    Dr. Emily Filbert is Medical Director for [place] and [facility].

## 2018-03-14 ENCOUNTER — Encounter: Payer: No Typology Code available for payment source | Admitting: *Deleted

## 2018-03-14 ENCOUNTER — Other Ambulatory Visit: Payer: Self-pay

## 2018-03-14 DIAGNOSIS — I350 Nonrheumatic aortic (valve) stenosis: Secondary | ICD-10-CM | POA: Diagnosis not present

## 2018-03-14 DIAGNOSIS — Z952 Presence of prosthetic heart valve: Secondary | ICD-10-CM

## 2018-03-14 NOTE — Progress Notes (Signed)
Daily Session Note  Patient Details  Name: Alex Gomez MRN: [id number] Date of Birth: 19-Nov-1945 Referring Provider:     Cardiac Rehab from 02/03/2018 in [facility] Cardiac and Pulmonary Rehab  Referring Provider  Diona Browner MD      Encounter Date: 03/14/2018  Check In: Session Check In - 03/14/18 0809      Check-In   Supervising physician immediately available to respond to emergencies  See telemetry face sheet for immediately available ER MD    Location  [facility]-Cardiac & Pulmonary Rehab    Staff Present  Renita Papa, RN BSN;Jessica Luan Pulling, MA, RCEP, CCRP, Exercise Physiologist;Amanda Oletta Darter, [place], ACSM CEP, Exercise Physiologist    Medication changes reported      No    Fall or balance concerns reported     No    Tobacco Cessation  No Change    Warm-up and Cool-down  Performed as group-led instruction    Resistance Training Performed  Yes    VAD Patient?  No    PAD/SET Patient?  No      Pain Assessment   Currently in Pain?  No/denies          Social History   Tobacco Use  Smoking Status Former Smoker  . Packs/day: 1.00  . Years: 30.00  . Pack years: 30.00  . Types: Cigarettes  . Start date: 73  . Last attempt to quit: 1999  . Years since quitting: 21.2  Smokeless Tobacco Never Used    Goals Met:  Independence with exercise equipment Exercise tolerated well No report of cardiac concerns or symptoms Strength training completed today  Goals Unmet:  Not Applicable  Comments: Pt able to follow exercise prescription today without complaint.  Will continue to monitor for progression.    Dr. Emily Filbert is Medical Director for [place] and [facility].

## 2018-03-26 ENCOUNTER — Encounter: Payer: Self-pay | Admitting: *Deleted

## 2018-03-26 DIAGNOSIS — Z952 Presence of prosthetic heart valve: Secondary | ICD-10-CM

## 2018-03-26 NOTE — Progress Notes (Signed)
Cardiac Individual Treatment Plan  Patient Details  Name: Alex Gomez MRN: [id number] Date of Birth: [birth year] Referring Provider:     Cardiac Rehab from 02/03/2018 in [facility] Cardiac and Pulmonary Rehab  Referring Provider  Diona Browner MD      Initial Encounter Date:    Cardiac Rehab from 02/03/2018 in [facility] Cardiac and Pulmonary Rehab  Date  02/03/18      Visit Diagnosis: S/P AVR (aortic valve replacement)  Patient's Home Medications on Admission:  Current Outpatient Medications:  .  amLODipine (NORVASC) 10 MG tablet, Take by mouth., Disp: , Rfl:  .  aspirin EC 81 MG tablet, Take by mouth., Disp: , Rfl:  .  benzonatate (TESSALON) 200 MG capsule, Take 200 mg by mouth at bedtime as needed for cough., Disp: , Rfl:  .  carboxymethylcellulose (REFRESH PLUS) 0.5 % SOLN, 1 drop 3 (three) times daily as needed., Disp: , Rfl:  .  Cholecalciferol 25 MCG (1000 UT) tablet, Take 1,000 Units by mouth daily., Disp: , Rfl:  .  finasteride (PROSCAR) 5 MG tablet, Take by mouth., Disp: , Rfl:  .  fluticasone (FLONASE) 50 MCG/ACT nasal spray, Place into the nose., Disp: , Rfl:  .  furosemide (LASIX) 40 MG tablet, Take by mouth., Disp: , Rfl:  .  guaiFENesin-codeine 100-10 MG/5ML syrup, Take by mouth., Disp: , Rfl:  .  oxybutynin (DITROPAN-XL) 5 MG 24 hr tablet, Take by mouth., Disp: , Rfl:  .  prazosin (MINIPRESS) 5 MG capsule, Take by mouth., Disp: , Rfl:  .  sennosides-docusate sodium (SENOKOT-S) 8.6-50 MG tablet, Take 2 tablets by mouth daily., Disp: , Rfl:  .  triamcinolone ointment (KENALOG) 0.1 %, Frequency:BID   Dosage:0.0     Instructions:  Note:Dose: 0.1%, Disp: , Rfl:  .  venlafaxine XR (EFFEXOR-XR) 150 MG 24 hr capsule, Take by mouth., Disp: , Rfl:  .  zolpidem (AMBIEN CR) 12.5 MG CR tablet, Take by mouth., Disp: , Rfl:   Past Medical History: No past medical history on file.  Tobacco Use: Social History   Tobacco Use  Smoking Status Former Smoker  . Packs/day: 1.00  . Years:  30.00  . Pack years: 30.00  . Types: Cigarettes  . Start date: 64  . Last attempt to quit: 1999  . Years since quitting: 21.2  Smokeless Tobacco Never Used    Labs: Recent Review Flowsheet Data    There is no flowsheet data to display.       Exercise Target Goals: Exercise Program Goal: Individual exercise prescription set using results from initial 6 min walk test and THRR while considering  patient's activity barriers and safety.   Exercise Prescription Goal: Initial exercise prescription builds to 30-45 minutes a day of aerobic activity, 2-3 days per week.  Home exercise guidelines will be given to patient during program as part of exercise prescription that the participant will acknowledge.  Activity Barriers & Risk Stratification: Activity Barriers & Cardiac Risk Stratification - 02/03/18 1342      Activity Barriers & Cardiac Risk Stratification   Activity Barriers  Joint Problems;Deconditioning;Muscular Weakness;Balance Concerns   Occassional knee pain   Cardiac Risk Stratification  High       6 Minute Walk: 6 Minute Walk    Row Name 02/03/18 1341         6 Minute Walk   Phase  Initial     Distance  1400 feet     Walk Time  6 minutes     #  of Rest Breaks  0     MPH  2.65     METS  11.26     RPE  11     VO2 Peak  3.22     Symptoms  No     Resting HR  78 bpm     Resting BP  134/66     Resting Oxygen Saturation   95 %     Exercise Oxygen Saturation  during 6 min walk  97 %     Max Ex. HR  114 bpm     Max Ex. BP  154/82     2 Minute Post BP  122/64        Oxygen Initial Assessment:   Oxygen Re-Evaluation:   Oxygen Discharge (Final Oxygen Re-Evaluation):   Initial Exercise Prescription: Initial Exercise Prescription - 02/03/18 1300      Date of Initial Exercise RX and Referring Provider   Date  02/03/18    Referring Provider  Diona Browner MD      Treadmill   MPH  2.5    Grade  0.5    Minutes  15    METs  3.09      Recumbant Bike    Level  3    RPM  50    Watts  41    Minutes  15    METs  3      T5 Nustep   Level  3    SPM  80    Minutes  15    METs  3      Prescription Details   Frequency (times per week)  3    Duration  Progress to 45 minutes of aerobic exercise without signs/symptoms of physical distress      Intensity   THRR 40-80% of Max Heartrate  107-136    Ratings of Perceived Exertion  11-13    Perceived Dyspnea  0-4      Progression   Progression  Continue to progress workloads to maintain intensity without signs/symptoms of physical distress.      Resistance Training   Training Prescription  Yes    Weight  3 lbs    Reps  10-15       Perform Capillary Blood Glucose checks as needed.  Exercise Prescription Changes: Exercise Prescription Changes    Row Name 02/03/18 1300 02/19/18 1700 03/05/18 1400 03/18/18 1400       Response to Exercise   Blood Pressure (Admit)  134/66  130/60  116/58  150/66    Blood Pressure (Exercise)  154/82  122/68  146/64  128/54    Blood Pressure (Exit)  122/64  116/60  122/60  114/66    Heart Rate (Admit)  78 bpm  72 bpm  109 bpm  94 bpm    Heart Rate (Exercise)  114 bpm  120 bpm  131 bpm  98 bpm    Heart Rate (Exit)  83 bpm  89 bpm  100 bpm  68 bpm    Oxygen Saturation (Admit)  95 %  -  -  -    Oxygen Saturation (Exercise)  97 %  -  -  -    Rating of Perceived Exertion (Exercise)  '11  15  13  13    '$ Symptoms  none  none  none  none    Comments  walk test results  -  -  -    Duration  -  Progress to 45 minutes of  aerobic exercise without signs/symptoms of physical distress takes rest breaks on treadmill  Progress to 45 minutes of aerobic exercise without signs/symptoms of physical distress takes rest breaks on treadmill  Progress to 45 minutes of aerobic exercise without signs/symptoms of physical distress takes rest breaks on treadmill    Intensity  -  THRR unchanged  THRR unchanged  THRR unchanged      Progression   Progression  -  Continue to progress  workloads to maintain intensity without signs/symptoms of physical distress.  Continue to progress workloads to maintain intensity without signs/symptoms of physical distress.  Continue to progress workloads to maintain intensity without signs/symptoms of physical distress.    Average METs  -  2.61  2.43  2.48      Resistance Training   Training Prescription  -  Yes  Yes  Yes    Weight  -  5 lbs  4 lbs  4 lbs    Reps  -  10-15  10-15  10-15      Interval Training   Interval Training  -  No  No  No      Treadmill   MPH  -  2.5  1.9  1.9    Grade  -  0.5  0.5  0.5    Minutes  -  15 occasional rest breaks or stops early  15 occasional rest breaks or stops early  15 occasional rest breaks or stops early    METs  -  3.09  2.59  2.59      Recumbant Bike   Level  -  '3  5  5    '$ Watts  -  '18  18  30    '$ Minutes  -  '15  15  15    '$ METs  -  2.76  2.76  2.86      T5 Nustep   Level  -  '4  4  4    '$ Minutes  -  '15  15  15    '$ METs  -  '2  2  2      '$ Home Exercise Plan   Plans to continue exercise at  -  Home (comment) walking  Home (comment) walking  Home (comment) walking    Frequency  -  Add 2 additional days to program exercise sessions.  Add 2 additional days to program exercise sessions.  Add 2 additional days to program exercise sessions.    Initial Home Exercises Provided  -  02/17/18  02/17/18  02/17/18       Exercise Comments: Exercise Comments    Row Name 02/07/18 0736           Exercise Comments  First full day of exercise!  Patient was oriented to gym and equipment including functions, settings, policies, and procedures.  Patient's individual exercise prescription and treatment plan were reviewed.  All starting workloads were established based on the results of the 6 minute walk test done at initial orientation visit.  The plan for exercise progression was also introduced and progression will be customized based on patient's performance and goals.          Exercise Goals and  Review: Exercise Goals    Row Name 02/03/18 1345             Exercise Goals   Increase Physical Activity  Yes       Intervention  Provide advice, education, support and counseling about physical activity/exercise needs.;Develop an individualized  exercise prescription for aerobic and resistive training based on initial evaluation findings, risk stratification, comorbidities and participant's personal goals.       Expected Outcomes  Short Term: Attend rehab on a regular basis to increase amount of physical activity.;Long Term: Add in home exercise to make exercise part of routine and to increase amount of physical activity.;Long Term: Exercising regularly at least 3-5 days a week.       Increase Strength and Stamina  Yes       Intervention  Develop an individualized exercise prescription for aerobic and resistive training based on initial evaluation findings, risk stratification, comorbidities and participant's personal goals.;Provide advice, education, support and counseling about physical activity/exercise needs.       Expected Outcomes  Short Term: Increase workloads from initial exercise prescription for resistance, speed, and METs.;Short Term: Perform resistance training exercises routinely during rehab and add in resistance training at home;Long Term: Improve cardiorespiratory fitness, muscular endurance and strength as measured by increased METs and functional capacity (6MWT)       Able to understand and use rate of perceived exertion (RPE) scale  Yes       Intervention  Provide education and explanation on how to use RPE scale       Expected Outcomes  Long Term:  Able to use RPE to guide intensity level when exercising independently;Short Term: Able to use RPE daily in rehab to express subjective intensity level       Knowledge and understanding of Target Heart Rate Range (THRR)  Yes       Intervention  Provide education and explanation of THRR including how the numbers were predicted and  where they are located for reference       Expected Outcomes  Short Term: Able to state/look up THRR;Long Term: Able to use THRR to govern intensity when exercising independently;Short Term: Able to use daily as guideline for intensity in rehab       Able to check pulse independently  Yes       Intervention  Provide education and demonstration on how to check pulse in carotid and radial arteries.;Review the importance of being able to check your own pulse for safety during independent exercise       Expected Outcomes  Short Term: Able to explain why pulse checking is important during independent exercise;Long Term: Able to check pulse independently and accurately       Understanding of Exercise Prescription  Yes       Intervention  Provide education, explanation, and written materials on patient's individual exercise prescription       Expected Outcomes  Short Term: Able to explain program exercise prescription;Long Term: Able to explain home exercise prescription to exercise independently          Exercise Goals Re-Evaluation : Exercise Goals Re-Evaluation    Row Name 02/07/18 0736 02/17/18 0846 03/05/18 1441 03/18/18 1439 03/20/18 1025     Exercise Goal Re-Evaluation   Exercise Goals Review  Increase Physical Activity;Increase Strength and Stamina;Able to understand and use rate of perceived exertion (RPE) scale;Knowledge and understanding of Target Heart Rate Range (THRR);Understanding of Exercise Prescription  Increase Physical Activity;Increase Strength and Stamina;Understanding of Exercise Prescription  Increase Physical Activity;Increase Strength and Stamina;Understanding of Exercise Prescription  Increase Physical Activity;Increase Strength and Stamina;Understanding of Exercise Prescription  Increase Physical Activity;Increase Strength and Stamina;Understanding of Exercise Prescription   Comments  Reviewed RPE scale, THR and program prescription with pt today.  Pt voiced understanding and  was  given a copy of goals to take home.   Alex Gomez is doing well in rehab.  He feels like his strength and stamina is getting better.  His first day is rough, but since then he has been doing a lot better. Reviewed home exercise with pt today.  Pt plans to walking at home for exercise.  Reviewed THR, pulse, RPE, sign and symptoms, and when to call 911 or MD.  Also discussed weather considerations and indoor options.  Pt voiced understanding.  Alex Gomez continues to do well in rehab.  He is on level 5 of the bike and up to 1.9 on the treadmill more consistently.  We will continue to monitor his progress.   Alex Gomez has been doing well in rehab.  He was out sick the previous week.  He was able to return to his workloads.  He will be walking at home.  We will continue to monitor his progress at home.   Alex Gomez has been trying to stay active at home. However, he is not really getting in cardio but rather just activity.  I encouraged him to check his email more frequently and to try the videos that we sent out yesterday.  He said that he would try.  Overall, he is feeling stronger and has more stamina.      Expected Outcomes  Short: Use RPE daily to regulate intensity. Long: Follow program prescription in THR.  Short: Start to walk at home on off days.  Long: Continue to increase strength and stamina.   Short: Increase treadmill incline.  Long: Continue to increase strength and stamina.   Short: Walk at home   Long: Continue to increase strength and stamina.   Short: Walk more and try out our videos.  Long: Continue to increase strength and stamina.       Discharge Exercise Prescription (Final Exercise Prescription Changes): Exercise Prescription Changes - 03/18/18 1400      Response to Exercise   Blood Pressure (Admit)  150/66    Blood Pressure (Exercise)  128/54    Blood Pressure (Exit)  114/66    Heart Rate (Admit)  94 bpm    Heart Rate (Exercise)  98 bpm    Heart Rate (Exit)  68 bpm    Rating of Perceived Exertion  (Exercise)  13    Symptoms  none    Duration  Progress to 45 minutes of aerobic exercise without signs/symptoms of physical distress   takes rest breaks on treadmill   Intensity  THRR unchanged      Progression   Progression  Continue to progress workloads to maintain intensity without signs/symptoms of physical distress.    Average METs  2.48      Resistance Training   Training Prescription  Yes    Weight  4 lbs    Reps  10-15      Interval Training   Interval Training  No      Treadmill   MPH  1.9    Grade  0.5    Minutes  15   occasional rest breaks or stops early   METs  2.59      Recumbant Bike   Level  5    Watts  30    Minutes  15    METs  2.86      T5 Nustep   Level  4    Minutes  15    METs  2      Home Exercise Plan  Plans to continue exercise at  Home (comment)   walking   Frequency  Add 2 additional days to program exercise sessions.    Initial Home Exercises Provided  02/17/18       Nutrition:  Target Goals: Understanding of nutrition guidelines, daily intake of sodium '1500mg'$ , cholesterol '200mg'$ , calories 30% from fat and 7% or less from saturated fats, daily to have 5 or more servings of fruits and vegetables.  Biometrics: Pre Biometrics - 02/03/18 1345      Pre Biometrics   Height  6' 0.6" (1.844 m)    Weight  236 lb (107 kg)    Waist Circumference  41 inches    Hip Circumference  42 inches    Waist to Hip Ratio  0.98 %    BMI (Calculated)  31.48    Single Leg Stand  2.96 seconds        Nutrition Therapy Plan and Nutrition Goals: Nutrition Therapy & Goals - 02/03/18 1451      Intervention Plan   Intervention  Prescribe, educate and counsel regarding individualized specific dietary modifications aiming towards targeted core components such as weight, hypertension, lipid management, diabetes, heart failure and other comorbidities.;Nutrition handout(s) given to patient.    Expected Outcomes  Short Term Goal: Understand basic principles  of dietary content, such as calories, fat, sodium, cholesterol and nutrients.;Long Term Goal: Adherence to prescribed nutrition plan.;Short Term Goal: A plan has been developed with personal nutrition goals set during dietitian appointment.       Nutrition Assessments: Nutrition Assessments - 02/03/18 1451      MEDFICTS Scores   Pre Score  65       Nutrition Goals Re-Evaluation: Nutrition Goals Re-Evaluation    Row Name 02/17/18 0841             Goals   Nutrition Goal  Heart healthy, low fat, low salt, more fruits and vegetables       Comment  Alex Gomez has low fat yogurt for breakfast with fruit.  He goes to [employer] for shakes for lunch.  He is eating fish for dinner and baked potato and vegetables.    He is trying to aim for 6-8 servings a day.  He continues to watch his sodium content.  He has not been reading food labels but will start to look at them more often.  Overall, he feels that he off to good start on his diet.  Continue to stick with it.  He has not been drinking a lot of water . He usually has 1/2 tea, but knows he needs to increase his water.  He has two beers a days.        Expected Outcome  Short: Switch to drinking more water.  Long: Meet with nutritionist in March.           Nutrition Goals Discharge (Final Nutrition Goals Re-Evaluation): Nutrition Goals Re-Evaluation - 02/17/18 0841      Goals   Nutrition Goal  Heart healthy, low fat, low salt, more fruits and vegetables    Comment  Alex Gomez has low fat yogurt for breakfast with fruit.  He goes to [employer] for shakes for lunch.  He is eating fish for dinner and baked potato and vegetables.    He is trying to aim for 6-8 servings a day.  He continues to watch his sodium content.  He has not been reading food labels but will start to look at them more often.  Overall, he feels  that he off to good start on his diet.  Continue to stick with it.  He has not been drinking a lot of water . He usually has  1/2 tea, but knows he needs to increase his water.  He has two beers a days.     Expected Outcome  Short: Switch to drinking more water.  Long: Meet with nutritionist in March.        Psychosocial: Target Goals: Acknowledge presence or absence of significant depression and/or stress, maximize coping skills, provide positive support system. Participant is able to verbalize types and ability to use techniques and skills needed for reducing stress and depression.   Initial Review & Psychosocial Screening: Initial Psych Review & Screening - 02/03/18 1448      Initial Review   Current issues with  Current Psychotropic Meds;Current Stress Concerns;Current Sleep Concerns    Source of Stress Concerns  Unable to perform yard/household activities    Comments  Otila Kluver is on disability through the [place] for PTSD. He is on medication, but can get startled if someone comes up from behind him. He reports sleeping better once his doctor started him on [place]. 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 He was used to staying active, so he tries to stay connected to the community by participating in different historical events. He loves history and takes pride in his roots.       Family Dynamics   Good Support System?  Yes   wife     Barriers   Psychosocial barriers to participate in program  There are no identifiable barriers or psychosocial needs.;The patient should benefit from training in stress management and relaxation.      Screening Interventions   Interventions  Encouraged to exercise;Program counselor consult;To provide support and resources with identified psychosocial needs;Provide feedback about the scores to participant    Expected Outcomes  Short Term goal: Utilizing psychosocial counselor, staff and physician to assist with identification of specific Stressors or current issues interfering with healing process. Setting desired goal for each stressor or current  issue identified.;Long Term Goal: Stressors or current issues are controlled or eliminated.;Short Term goal: Identification and review with participant of any Quality of Life or Depression concerns found by scoring the questionnaire.;Long Term goal: The participant improves quality of Life and PHQ9 Scores as seen by post scores and/or verbalization of changes       Quality of Life Scores:  Quality of Life - 02/03/18 1451      Quality of Life   Select  Quality of Life      Quality of Life Scores   Health/Function Pre  29.03 %    Socioeconomic Pre  28.79 %    Psych/Spiritual Pre  30 %    Family Pre  30 %    GLOBAL Pre  29.3 %      Scores of 19 and below usually indicate a poorer quality of life in these areas.  A difference of  2-3 points is a clinically meaningful difference.  A difference of 2-3 points in the total score of the Quality of Life Index has been associated with significant improvement in overall quality of life, self-image, physical symptoms, and general health in studies assessing change in quality of life.  PHQ-9: Recent Review Flowsheet Data    Depression screen [facility] 2/9 02/03/2018   Decreased Interest 0   Down, Depressed, Hopeless 1   PHQ - 2 Score 1  Altered sleeping 0   Tired, decreased energy 1   Change in appetite 0   Feeling bad or failure about yourself  1   Trouble concentrating 0   Moving slowly or fidgety/restless 1   Suicidal thoughts 0   PHQ-9 Score 4   Difficult doing work/chores Not difficult at all     Interpretation of Total Score  Total Score Depression Severity:  1-4 = Minimal depression, 5-9 = Mild depression, 10-14 = Moderate depression, 15-19 = Moderately severe depression, 20-27 = Severe depression   Psychosocial Evaluation and Intervention: Psychosocial Evaluation - 02/10/18 0947      Psychosocial Evaluation & Interventions   Interventions  Stress management education;Relaxation education;Encouraged to exercise with the program and  follow exercise prescription    Comments  Counselor met with Alex Gomez) today for initial psychosocial evaluation.  He is a 73 year old who had an aaorta Valve replacement in November, 2019.  He has a strong suport system with a spouse of 12 years; friends and active involvement in his local church.  He reports being in fairly good health overall with sleeping well; a good appetite and typically in a positive mood.  He has a history of PTSD subsequent to serving in [place] and he is under the care of a psychiatrist and has attended groups for this as well.  Alex Gomez also takes medications to help him sleep and for his PTSD and reports these work well for him.  He has some stress in his life with "worrying about what others think" often.  He has worked on this with a Social worker in the past.  Alex Gomez has goals for increasing his stamina and strength; being more heart healthy; and to improve his mental/emotional state as well.  He will be followed by staff.    Expected Outcomes  Short:  Alex Gomez will exercise consistently to increase his stamina and strength and improve his mental health.  He will continue to be followed by the [facility] for his mental health needs.  Long:  Alex Gomez will develop a routine of exercise for his health and mental health.      Continue Psychosocial Services   Follow up required by staff       Psychosocial Re-Evaluation: Psychosocial Re-Evaluation    [place] Name 03/19/18 1657 03/20/18 1021           Psychosocial Re-Evaluation   Current issues with  None Identified  None Identified      Comments  Counselor Learta Codding charting in [place] [facility] session: counselor contacted patient to complete re-evaluation; Patient expressed that he would prefer counselor to call back at a later time.  counselor Learta Codding charting in [place] session: counselor contacted patient to complete reevaluation:  he spoke of falling on the steps the other night but only outcome being a little sore and needing to  replace a spindle on the stair case.  Patient indicated that he continues to maintain his goals that he began the program with.  Patient reported his mood being an 8 on a scale from 1 to 10 which is an imcrease from the 5 reported at initial assessment.  When counselor pointed out change, patient reported that his history of PTSD often has him encountering 'bumps' in mood and that he feels he is doing really well at present.      Expected Outcomes  Counselor to continue to attempt follow up.  Short: finding ways to continue to exercise outside of program; long: exploring  ways to  maintain program progress in the long term      Interventions  -  Encouraged to attend Pulmonary Rehabilitation for the exercise      Continue Psychosocial Services   Follow up required by counselor  Follow up required by staff         Psychosocial Discharge (Final Psychosocial Re-Evaluation): Psychosocial Re-Evaluation - 03/20/18 1021      Psychosocial Re-Evaluation   Current issues with  None Identified    Comments  counselor Learta Codding charting in [place] session: counselor contacted patient to complete reevaluation:  he spoke of falling on the steps the other night but only outcome being a little sore and needing to replace a spindle on the stair case.  Patient indicated that he continues to maintain his goals that he began the program with.  Patient reported his mood being an 8 on a scale from 1 to 10 which is an imcrease from the 5 reported at initial assessment.  When counselor pointed out change, patient reported that his history of PTSD often has him encountering 'bumps' in mood and that he feels he is doing really well at present.    Expected Outcomes  Short: finding ways to continue to exercise outside of program; long: exploring ways to  maintain program progress in the long term    Interventions  Encouraged to attend Pulmonary Rehabilitation for the exercise    Continue Psychosocial Services   Follow up required  by staff       Vocational Rehabilitation: Provide vocational rehab assistance to qualifying candidates.   Vocational Rehab Evaluation & Intervention: Vocational Rehab - 02/03/18 1448      Initial Vocational Rehab Evaluation & Intervention   Assessment shows need for Vocational Rehabilitation  No       Education: Education Goals: Education classes will be provided on a variety of topics geared toward better understanding of heart health and risk factor modification. Participant will state understanding/return demonstration of topics presented as noted by education test scores.  Learning Barriers/Preferences: Learning Barriers/Preferences - 02/03/18 1445      Learning Barriers/Preferences   Learning Barriers  Hearing    Learning Preferences  Individual Instruction;Verbal Instruction       Education Topics:  AED/CPR: - Group verbal and written instruction with the use of models to demonstrate the basic use of the AED with the basic ABC's of resuscitation.   Cardiac Rehab from 03/05/2018 in [facility] Cardiac and Pulmonary Rehab  Date  02/24/18  Educator  SB  Instruction Review Code  1- Verbalizes Understanding      General Nutrition Guidelines/Fats and Fiber: -Group instruction provided by verbal, written material, models and posters to present the general guidelines for heart healthy nutrition. Gives an explanation and review of dietary fats and fiber.   Controlling Sodium/Reading Food Labels: -Group verbal and written material supporting the discussion of sodium use in heart healthy nutrition. Review and explanation with models, verbal and written materials for utilization of the food label.   Exercise Physiology & General Exercise Guidelines: - Group verbal and written instruction with models to review the exercise physiology of the cardiovascular system and associated critical values. Provides general exercise guidelines with specific guidelines to those with heart or lung  disease.    Aerobic Exercise & Resistance Training: - Gives group verbal and written instruction on the various components of exercise. Focuses on aerobic and resistive training programs and the benefits of this training and how to safely progress through  these programs..   Flexibility, Balance, Mind/Body Relaxation: Provides group verbal/written instruction on the benefits of flexibility and balance training, including mind/body exercise modes such as yoga, pilates and tai chi.  Demonstration and skill practice provided.   Stress and Anxiety: - Provides group verbal and written instruction about the health risks of elevated stress and causes of high stress.  Discuss the correlation between heart/lung disease and anxiety and treatment options. Review healthy ways to manage with stress and anxiety.   Cardiac Rehab from 03/05/2018 in [facility] Cardiac and Pulmonary Rehab  Date  02/19/18  Educator  [facility]  Instruction Review Code  1- Verbalizes Understanding      Depression: - Provides group verbal and written instruction on the correlation between heart/lung disease and depressed mood, treatment options, and the stigmas associated with seeking treatment.   Cardiac Rehab from 03/05/2018 in [facility] Cardiac and Pulmonary Rehab  Date  03/05/18  Educator  KG  Instruction Review Code  1- Verbalizes Understanding      Anatomy & Physiology of the Heart: - Group verbal and written instruction and models provide basic cardiac anatomy and physiology, with the coronary electrical and arterial systems. Review of Valvular disease and Heart Failure   Cardiac Procedures: - Group verbal and written instruction to review commonly prescribed medications for heart disease. Reviews the medication, class of the drug, and side effects. Includes the steps to properly store meds and maintain the prescription regimen. (beta blockers and nitrates)   Cardiac Rehab from 03/05/2018 in [facility] Cardiac and Pulmonary Rehab  Date   02/17/18  Educator  SB  Instruction Review Code  1- Verbalizes Understanding      Cardiac Medications I: - Group verbal and written instruction to review commonly prescribed medications for heart disease. Reviews the medication, class of the drug, and side effects. Includes the steps to properly store meds and maintain the prescription regimen.   Cardiac Rehab from 03/05/2018 in [facility] Cardiac and Pulmonary Rehab  Date  02/10/18  Educator  SB  Instruction Review Code  1- Verbalizes Understanding      Cardiac Medications II: -Group verbal and written instruction to review commonly prescribed medications for heart disease. Reviews the medication, class of the drug, and side effects. (all other drug classes)   Cardiac Rehab from 03/05/2018 in [name] Cardiac and Pulmonary Rehab  Date  03/03/18  Educator  SB  Instruction Review Code  1- Verbalizes Understanding       Go Sex-Intimacy & Heart Disease, Get SMART - Goal Setting: - Group verbal and written instruction through game format to discuss heart disease and the return to sexual intimacy. Provides group verbal and written material to discuss and apply goal setting through the application of the S.M.A.R.T. Method.   Cardiac Rehab from 03/05/2018 in [facility] Cardiac and Pulmonary Rehab  Date  02/17/18  Educator  SB  Instruction Review Code  1- Verbalizes Understanding      Other Matters of the Heart: - Provides group verbal, written materials and models to describe Stable Angina and Peripheral Artery. Includes description of the disease process and treatment options available to the cardiac patient.   Exercise & Equipment Safety: - Individual verbal instruction and demonstration of equipment use and safety with use of the equipment.   Cardiac Rehab from 03/05/2018 in [facility] Cardiac and Pulmonary Rehab  Date  02/03/18  Educator  [facility]  Instruction Review Code  1- Verbalizes Understanding      Infection Prevention: - Provides verbal and written  material to individual with discussion of infection control including proper hand washing and proper equipment cleaning during exercise session.   Cardiac Rehab from 03/05/2018 in [facility] Cardiac and Pulmonary Rehab  Date  02/03/18  Educator  [facility]  Instruction Review Code  1- Verbalizes Understanding      Falls Prevention: - Provides verbal and written material to individual with discussion of falls prevention and safety.   Cardiac Rehab from 03/05/2018 in [facility] Cardiac and Pulmonary Rehab  Date  02/03/18  Educator  [facility]  Instruction Review Code  1- Verbalizes Understanding      Diabetes: - Individual verbal and written instruction to review signs/symptoms of diabetes, desired ranges of glucose level fasting, after meals and with exercise. Acknowledge that pre and post exercise glucose checks will be done for 3 sessions at entry of program.   Know Your Numbers and Risk Factors: -Group verbal and written instruction about important numbers in your health.  Discussion of what are risk factors and how they play a role in the disease process.  Review of Cholesterol, Blood Pressure, Diabetes, and BMI and the role they play in your overall health.   Cardiac Rehab from 03/05/2018 in [facility] Cardiac and Pulmonary Rehab  Date  03/03/18  Educator  SB  Instruction Review Code  1- Verbalizes Understanding      Sleep Hygiene: -Provides group verbal and written instruction about how sleep can affect your health.  Define sleep hygiene, discuss sleep cycles and impact of sleep habits. Review good sleep hygiene tips.    Other: -Provides group and verbal instruction on various topics (see comments)   Knowledge Questionnaire Score: Knowledge Questionnaire Score - 02/03/18 1445      Knowledge Questionnaire Score   Pre Score  23/26       Core Components/Risk Factors/Patient Goals at Admission: Personal Goals and Risk Factors at Admission - 02/03/18 1444      Core Components/Risk Factors/Patient Goals on  Admission    Weight Management  Yes;Weight Loss    Intervention  Weight Management: Develop a combined nutrition and exercise program designed to reach desired caloric intake, while maintaining appropriate intake of nutrient and fiber, sodium and fats, and appropriate energy expenditure required for the weight goal.;Weight Management: Provide education and appropriate resources to help participant work on and attain dietary goals.;Weight Management/Obesity: Establish reasonable short term and long term weight goals.    Admit Weight  236 lb (107 kg)    Goal Weight: Short Term  232 lb (105.2 kg)    Goal Weight: Long Term  225 lb (102.1 kg)    Expected Outcomes  Short Term: Continue to assess and modify interventions until short term weight is achieved;Long Term: Adherence to nutrition and physical activity/exercise program aimed toward attainment of established weight goal;Weight Loss: Understanding of general recommendations for a balanced deficit meal plan, which promotes 1-2 lb weight loss per week and includes a negative energy balance of [phone] kcal/d;Understanding recommendations for meals to include 15-35% energy as protein, 25-35% energy from fat, 35-60% energy from carbohydrates, less than '200mg'$  of dietary cholesterol, 20-35 gm of total fiber daily;Understanding of distribution of calorie intake throughout the day with the consumption of 4-5 meals/snacks    Hypertension  Yes    Intervention  Provide education on lifestyle modifcations including regular physical activity/exercise, weight management, moderate sodium restriction and increased consumption of fresh fruit, vegetables, and low fat dairy, alcohol moderation, and smoking cessation.;Monitor prescription use compliance.    Expected Outcomes  Short Term:  Continued assessment and intervention until BP is < 140/81m HG in hypertensive participants. < 130/880mHG in hypertensive participants with diabetes, heart failure or chronic kidney  disease.;Long Term: Maintenance of blood pressure at goal levels.    Lipids  Yes    Intervention  Provide education and support for participant on nutrition & aerobic/resistive exercise along with prescribed medications to achieve LDL '70mg'$ , HDL >'40mg'$ .    Expected Outcomes  Short Term: Participant states understanding of desired cholesterol values and is compliant with medications prescribed. Participant is following exercise prescription and nutrition guidelines.;Long Term: Cholesterol controlled with medications as prescribed, with individualized exercise RX and with personalized nutrition plan. Value goals: LDL < '70mg'$ , HDL > 40 mg.       Core Components/Risk Factors/Patient Goals Review:  Goals and Risk Factor Review    Row Name 02/17/18 0838 03/20/18 1022           Core Components/Risk Factors/Patient Goals Review   Personal Goals Review  Weight Management/Obesity;Hypertension;Lipids  Weight Management/Obesity;Hypertension;Lipids      Review  StRichardson Landrys off to a good start in rehab.  He is already starting to lose weight. We talked about how working on his diet and adding home exercise will help with the weight loss too.  He is doing well with his blood pressures in class.  He has not been checking them at home, but will start and keep a log of it to take to doctors office.   Overall, he feels that his medications are working well for him.    StRichardson Landryas continued to weight daily at home.  He is down to 229 lbs today.  He has continued to work on his diet and is trying to stay active at home.  We sent out some education information today and encouraged him to check his email to see what we have been sending as he rarely checks it.  He has not been checking his blood pressure at home, but will try to start that up again.  Overall, he is doing well and staying compliant with his medications.       Expected Outcomes  Short: Start to take blood pressures more at home.  Long: Continue to work on weight  loss.   Short: Take blood pressures at home.  Long: Continue to work on weight loss.          Core Components/Risk Factors/Patient Goals at Discharge (Final Review):  Goals and Risk Factor Review - 03/20/18 1022      Core Components/Risk Factors/Patient Goals Review   Personal Goals Review  Weight Management/Obesity;Hypertension;Lipids    Review  StRichardson Landryas continued to weight daily at home.  He is down to 229 lbs today.  He has continued to work on his diet and is trying to stay active at home.  We sent out some education information today and encouraged him to check his email to see what we have been sending as he rarely checks it.  He has not been checking his blood pressure at home, but will try to start that up again.  Overall, he is doing well and staying compliant with his medications.     Expected Outcomes  Short: Take blood pressures at home.  Long: Continue to work on weight loss.        ITP Comments: ITP Comments    Row Name 02/03/18 1410 02/26/18 0556 03/18/18 1439 03/26/18 1143     ITP Comments  Med Review completed. Initial ITP created. Diagnosis  can be found in [place] paperwork under media tab  30 day review. Continue with ITP unless directed changes by Medical Director chart review.  Our program is currently closed due to COVID-19.  We are communicating with patient via phone calls and emails.    30 day review. Continue with ITP unless directed changes by Medical Director chart review.       Comments:

## 2018-03-27 DIAGNOSIS — Z952 Presence of prosthetic heart valve: Secondary | ICD-10-CM

## 2018-04-01 ENCOUNTER — Encounter: Payer: Self-pay | Admitting: *Deleted

## 2018-04-01 DIAGNOSIS — Z952 Presence of prosthetic heart valve: Secondary | ICD-10-CM

## 2018-04-17 ENCOUNTER — Encounter: Payer: Self-pay | Admitting: *Deleted

## 2018-04-17 DIAGNOSIS — Z952 Presence of prosthetic heart valve: Secondary | ICD-10-CM

## 2018-05-12 ENCOUNTER — Encounter: Payer: Self-pay | Admitting: *Deleted

## 2018-05-12 DIAGNOSIS — Z952 Presence of prosthetic heart valve: Secondary | ICD-10-CM

## 2018-06-11 DIAGNOSIS — Z952 Presence of prosthetic heart valve: Secondary | ICD-10-CM

## 2018-06-16 ENCOUNTER — Telehealth: Payer: Self-pay

## 2018-06-16 NOTE — Telephone Encounter (Signed)
Called pt back from 6/10 to make sure was still ok from that mornings fall. Pt reports he is doing well today. Encouraged pt to let doctor know what has been going on with his falls.

## 2018-07-03 ENCOUNTER — Encounter: Payer: No Typology Code available for payment source | Attending: Internal Medicine | Admitting: *Deleted

## 2018-07-03 ENCOUNTER — Other Ambulatory Visit: Payer: Self-pay

## 2018-07-03 DIAGNOSIS — Z79899 Other long term (current) drug therapy: Secondary | ICD-10-CM | POA: Insufficient documentation

## 2018-07-03 DIAGNOSIS — Z952 Presence of prosthetic heart valve: Secondary | ICD-10-CM | POA: Insufficient documentation

## 2018-07-03 DIAGNOSIS — Z87891 Personal history of nicotine dependence: Secondary | ICD-10-CM | POA: Insufficient documentation

## 2018-07-03 DIAGNOSIS — Z7982 Long term (current) use of aspirin: Secondary | ICD-10-CM | POA: Insufficient documentation

## 2018-07-03 NOTE — Progress Notes (Signed)
Cardiac Individual Treatment Plan  Patient Details  Name: Alex Gomez MRN: [id number] Date of Birth: [birth year] Referring Provider:     Cardiac Rehab from 02/03/2018 in [facility] Cardiac and Pulmonary Rehab  Referring Provider  Diona Browner MD      Initial Encounter Date:    Cardiac Rehab from 02/03/2018 in [facility] Cardiac and Pulmonary Rehab  Date  02/03/18      Visit Diagnosis: S/P AVR (aortic valve replacement)  Patient's Home Medications on Admission:  Current Outpatient Medications:  .  amLODipine (NORVASC) 10 MG tablet, Take by mouth., Disp: , Rfl:  .  aspirin EC 81 MG tablet, Take by mouth., Disp: , Rfl:  .  benzonatate (TESSALON) 200 MG capsule, Take 200 mg by mouth at bedtime as needed for cough., Disp: , Rfl:  .  carboxymethylcellulose (REFRESH PLUS) 0.5 % SOLN, 1 drop 3 (three) times daily as needed., Disp: , Rfl:  .  Cholecalciferol 25 MCG (1000 UT) tablet, Take 1,000 Units by mouth daily., Disp: , Rfl:  .  finasteride (PROSCAR) 5 MG tablet, Take by mouth., Disp: , Rfl:  .  fluticasone (FLONASE) 50 MCG/ACT nasal spray, Place into the nose., Disp: , Rfl:  .  furosemide (LASIX) 40 MG tablet, Take by mouth., Disp: , Rfl:  .  guaiFENesin-codeine 100-10 MG/5ML syrup, Take by mouth., Disp: , Rfl:  .  oxybutynin (DITROPAN-XL) 5 MG 24 hr tablet, Take by mouth., Disp: , Rfl:  .  prazosin (MINIPRESS) 5 MG capsule, Take by mouth., Disp: , Rfl:  .  sennosides-docusate sodium (SENOKOT-S) 8.6-50 MG tablet, Take 2 tablets by mouth daily., Disp: , Rfl:  .  triamcinolone ointment (KENALOG) 0.1 %, Frequency:BID   Dosage:0.0     Instructions:  Note:Dose: 0.1%, Disp: , Rfl:  .  venlafaxine XR (EFFEXOR-XR) 150 MG 24 hr capsule, Take by mouth., Disp: , Rfl:  .  zolpidem (AMBIEN CR) 12.5 MG CR tablet, Take by mouth., Disp: , Rfl:   Past Medical History: No past medical history on file.  Tobacco Use: Social History   Tobacco Use  Smoking Status Former Smoker  . Packs/day: 1.00  . Years:  30.00  . Pack years: 30.00  . Types: Cigarettes  . Start date: 40  . Quit date: 1999  . Years since quitting: 21.5  Smokeless Tobacco Never Used    Labs: Recent Review Flowsheet Data    There is no flowsheet data to display.       Exercise Target Goals: Exercise Program Goal: Individual exercise prescription set using results from initial 6 min walk test and THRR while considering  patient's activity barriers and safety.   Exercise Prescription Goal: Initial exercise prescription builds to 30-45 minutes a day of aerobic activity, 2-3 days per week.  Home exercise guidelines will be given to patient during program as part of exercise prescription that the participant will acknowledge.  Activity Barriers & Risk Stratification: Activity Barriers & Cardiac Risk Stratification - 02/03/18 1342      Activity Barriers & Cardiac Risk Stratification   Activity Barriers  Joint Problems;Deconditioning;Muscular Weakness;Balance Concerns   Occassional knee pain   Cardiac Risk Stratification  High       6 Minute Walk: 6 Minute Walk    Row Name 02/03/18 1341         6 Minute Walk   Phase  Initial     Distance  1400 feet     Walk Time  6 minutes     # of  Rest Breaks  0     MPH  2.65     METS  11.26     RPE  11     VO2 Peak  3.22     Symptoms  No     Resting HR  78 bpm     Resting BP  134/66     Resting Oxygen Saturation   95 %     Exercise Oxygen Saturation  during 6 min walk  97 %     Max Ex. HR  114 bpm     Max Ex. BP  154/82     2 Minute Post BP  122/64        Oxygen Initial Assessment:   Oxygen Re-Evaluation:   Oxygen Discharge (Final Oxygen Re-Evaluation):   Initial Exercise Prescription: Initial Exercise Prescription - 02/03/18 1300      Date of Initial Exercise RX and Referring Provider   Date  02/03/18    Referring Provider  Diona Browner MD      Treadmill   MPH  2.5    Grade  0.5    Minutes  15    METs  3.09      Recumbant Bike   Level  3     RPM  50    Watts  41    Minutes  15    METs  3      T5 Nustep   Level  3    SPM  80    Minutes  15    METs  3      Prescription Details   Frequency (times per week)  3    Duration  Progress to 45 minutes of aerobic exercise without signs/symptoms of physical distress      Intensity   THRR 40-80% of Max Heartrate  107-136    Ratings of Perceived Exertion  11-13    Perceived Dyspnea  0-4      Progression   Progression  Continue to progress workloads to maintain intensity without signs/symptoms of physical distress.      Resistance Training   Training Prescription  Yes    Weight  3 lbs    Reps  10-15       Perform Capillary Blood Glucose checks as needed.  Exercise Prescription Changes: Exercise Prescription Changes    Row Name 02/03/18 1300 02/19/18 1700 03/05/18 1400 03/18/18 1400       Response to Exercise   Blood Pressure (Admit)  134/66  130/60  116/58  150/66    Blood Pressure (Exercise)  154/82  122/68  146/64  128/54    Blood Pressure (Exit)  122/64  116/60  122/60  114/66    Heart Rate (Admit)  78 bpm  72 bpm  109 bpm  94 bpm    Heart Rate (Exercise)  114 bpm  120 bpm  131 bpm  98 bpm    Heart Rate (Exit)  83 bpm  89 bpm  100 bpm  68 bpm    Oxygen Saturation (Admit)  95 %  -  -  -    Oxygen Saturation (Exercise)  97 %  -  -  -    Rating of Perceived Exertion (Exercise)  _0 Symptoms  none  none  none  none    Comments  walk test results  -  -  -    Duration  -  Progress to 45 minutes of aerobic  exercise without signs/symptoms of physical distress takes rest breaks on treadmill  Progress to 45 minutes of aerobic exercise without signs/symptoms of physical distress takes rest breaks on treadmill  Progress to 45 minutes of aerobic exercise without signs/symptoms of physical distress takes rest breaks on treadmill    Intensity  -  THRR unchanged  THRR unchanged  THRR unchanged      Progression   Progression  -  Continue to progress workloads to  maintain intensity without signs/symptoms of physical distress.  Continue to progress workloads to maintain intensity without signs/symptoms of physical distress.  Continue to progress workloads to maintain intensity without signs/symptoms of physical distress.    Average METs  -  2.61  2.43  2.48      Resistance Training   Training Prescription  -  Yes  Yes  Yes    Weight  -  5 lbs  4 lbs  4 lbs    Reps  -  10-15  10-15  10-15      Interval Training   Interval Training  -  No  No  No      Treadmill   MPH  -  2.5  1.9  1.9    Grade  -  0.5  0.5  0.5    Minutes  -  15 occasional rest breaks or stops early  15 occasional rest breaks or stops early  15 occasional rest breaks or stops early    METs  -  3.09  2.59  2.59      Recumbant Bike   Level  -  _0 Watts  -  _1 Minutes  -  _2 METs  -  2.76  2.76  2.86      T5 Nustep   Level  -  _3 Minutes  -  _4 METs  -  _5 Home Exercise Plan   Plans to continue exercise at  -  Home (comment) walking  Home (comment) walking  Home (comment) walking    Frequency  -  Add 2 additional days to program exercise sessions.  Add 2 additional days to program exercise sessions.  Add 2 additional days to program exercise sessions.    Initial Home Exercises Provided  -  02/17/18  02/17/18  02/17/18       Exercise Comments: Exercise Comments    Row Name 02/07/18 0736           Exercise Comments  First full day of exercise!  Patient was oriented to gym and equipment including functions, settings, policies, and procedures.  Patient's individual exercise prescription and treatment plan were reviewed.  All starting workloads were established based on the results of the 6 minute walk test done at initial orientation visit.  The plan for exercise progression was also introduced and progression will be customized based on patient's performance and goals.          Exercise Goals and Review: Exercise  Goals    Row Name 02/03/18 1345             Exercise Goals   Increase Physical Activity  Yes       Intervention  Provide advice, education, support and counseling about physical activity/exercise needs.;Develop an individualized exercise  prescription for aerobic and resistive training based on initial evaluation findings, risk stratification, comorbidities and participant's personal goals.       Expected Outcomes  Short Term: Attend rehab on a regular basis to increase amount of physical activity.;Long Term: Add in home exercise to make exercise part of routine and to increase amount of physical activity.;Long Term: Exercising regularly at least 3-5 days a week.       Increase Strength and Stamina  Yes       Intervention  Develop an individualized exercise prescription for aerobic and resistive training based on initial evaluation findings, risk stratification, comorbidities and participant's personal goals.;Provide advice, education, support and counseling about physical activity/exercise needs.       Expected Outcomes  Short Term: Increase workloads from initial exercise prescription for resistance, speed, and METs.;Short Term: Perform resistance training exercises routinely during rehab and add in resistance training at home;Long Term: Improve cardiorespiratory fitness, muscular endurance and strength as measured by increased METs and functional capacity (6MWT)       Able to understand and use rate of perceived exertion (RPE) scale  Yes       Intervention  Provide education and explanation on how to use RPE scale       Expected Outcomes  Long Term:  Able to use RPE to guide intensity level when exercising independently;Short Term: Able to use RPE daily in rehab to express subjective intensity level       Knowledge and understanding of Target Heart Rate Range (THRR)  Yes       Intervention  Provide education and explanation of THRR including how the numbers were predicted and where they are  located for reference       Expected Outcomes  Short Term: Able to state/look up THRR;Long Term: Able to use THRR to govern intensity when exercising independently;Short Term: Able to use daily as guideline for intensity in rehab       Able to check pulse independently  Yes       Intervention  Provide education and demonstration on how to check pulse in carotid and radial arteries.;Review the importance of being able to check your own pulse for safety during independent exercise       Expected Outcomes  Short Term: Able to explain why pulse checking is important during independent exercise;Long Term: Able to check pulse independently and accurately       Understanding of Exercise Prescription  Yes       Intervention  Provide education, explanation, and written materials on patient's individual exercise prescription       Expected Outcomes  Short Term: Able to explain program exercise prescription;Long Term: Able to explain home exercise prescription to exercise independently          Exercise Goals Re-Evaluation : Exercise Goals Re-Evaluation    Row Name 02/07/18 0736 02/17/18 0846 03/05/18 1441 03/18/18 1439 03/20/18 1025     Exercise Goal Re-Evaluation   Exercise Goals Review  Increase Physical Activity;Increase Strength and Stamina;Able to understand and use rate of perceived exertion (RPE) scale;Knowledge and understanding of Target Heart Rate Range (THRR);Understanding of Exercise Prescription  Increase Physical Activity;Increase Strength and Stamina;Understanding of Exercise Prescription  Increase Physical Activity;Increase Strength and Stamina;Understanding of Exercise Prescription  Increase Physical Activity;Increase Strength and Stamina;Understanding of Exercise Prescription  Increase Physical Activity;Increase Strength and Stamina;Understanding of Exercise Prescription   Comments  Reviewed RPE scale, THR and program prescription with pt today.  Pt voiced understanding and was given a  copy  of goals to take home.   Alex Gomez is doing well in rehab.  He feels like his strength and stamina is getting better.  His first day is rough, but since then he has been doing a lot better. Reviewed home exercise with pt today.  Pt plans to walking at home for exercise.  Reviewed THR, pulse, RPE, sign and symptoms, and when to call 911 or MD.  Also discussed weather considerations and indoor options.  Pt voiced understanding.  Alex Gomez continues to do well in rehab.  He is on level 5 of the bike and up to 1.9 on the treadmill more consistently.  We will continue to monitor his progress.   Alex Gomez has been doing well in rehab.  He was out sick the previous week.  He was able to return to his workloads.  He will be walking at home.  We will continue to monitor his progress at home.   Alex Gomez has been trying to stay active at home. However, he is not really getting in cardio but rather just activity.  I encouraged him to check his email more frequently and to try the videos that we sent out yesterday.  He said that he would try.  Overall, he is feeling stronger and has more stamina.      Expected Outcomes  Short: Use RPE daily to regulate intensity. Long: Follow program prescription in THR.  Short: Start to walk at home on off days.  Long: Continue to increase strength and stamina.   Short: Increase treadmill incline.  Long: Continue to increase strength and stamina.   Short: Walk at home   Long: Continue to increase strength and stamina.   Short: Walk more and try out our videos.  Long: Continue to increase strength and stamina.    Rodney Name 04/01/18 1154 04/17/18 1018 05/12/18 1214         Exercise Goal Re-Evaluation   Exercise Goals Review  Increase Physical Activity;Increase Strength and Stamina;Understanding of Exercise Prescription  Increase Physical Activity;Increase Strength and Stamina;Understanding of Exercise Prescription  Increase Physical Activity;Increase Strength and Stamina;Understanding of Exercise  Prescription     Comments  Alex Gomez has been doing well at home.  He feel down the stairs last week.  He thinks it was from the numbeness that he gets in his legs on occasion. Since his fall, he has not been out walking but hopes to get back to it.  I encouraged him to use the videos to exercise, especially the chair exercises as he recovers from fall.   Alex Gomez is still struggling with his knee from the fall.  He has not called the doctor but was encouraged to do so.  He will call his PCP to see what they say.  It has kept him from walking a lot.  He has been staying active by working out in the yard.  He was encouraged to use the videos and do chair exercises again.   Alex Gomez has been doing some exercise. He is trying to walk some.  He continues to get out to work in the yard.      Expected Outcomes  Short: Try to use the chair exercise videos.  Long: Continue to rebuild strength and stamina.   Short: Call doctor about knee.  Long: Continue to move more.   Short: Continue to try to get in more exercise.  Long: Continue to increase activity levels.         Discharge Exercise Prescription (Final Exercise  Prescription Changes): Exercise Prescription Changes - 03/18/18 1400      Response to Exercise   Blood Pressure (Admit)  150/66    Blood Pressure (Exercise)  128/54    Blood Pressure (Exit)  114/66    Heart Rate (Admit)  94 bpm    Heart Rate (Exercise)  98 bpm    Heart Rate (Exit)  68 bpm    Rating of Perceived Exertion (Exercise)  13    Symptoms  none    Duration  Progress to 45 minutes of aerobic exercise without signs/symptoms of physical distress   takes rest breaks on treadmill   Intensity  THRR unchanged      Progression   Progression  Continue to progress workloads to maintain intensity without signs/symptoms of physical distress.    Average METs  2.48      Resistance Training   Training Prescription  Yes    Weight  4 lbs    Reps  10-15      Interval Training   Interval Training  No       Treadmill   MPH  1.9    Grade  0.5    Minutes  15   occasional rest breaks or stops early   METs  2.59      Recumbant Bike   Level  5    Watts  30    Minutes  15    METs  2.86      T5 Nustep   Level  4    Minutes  15    METs  2      Home Exercise Plan   Plans to continue exercise at  Home (comment)   walking   Frequency  Add 2 additional days to program exercise sessions.    Initial Home Exercises Provided  02/17/18       Nutrition:  Target Goals: Understanding of nutrition guidelines, daily intake of sodium <1544m, cholesterol <2060m calories 30% from fat and 7% or less from saturated fats, daily to have 5 or more servings of fruits and vegetables.  Biometrics: Pre Biometrics - 02/03/18 1345      Pre Biometrics   Height  6' 0.6" (1.844 m)    Weight  236 lb (107 kg)    Waist Circumference  41 inches    Hip Circumference  42 inches    Waist to Hip Ratio  0.98 %    BMI (Calculated)  31.48    Single Leg Stand  2.96 seconds        Nutrition Therapy Plan and Nutrition Goals: Nutrition Therapy & Goals - 06/11/18 1046      Personal Nutrition Goals   Comments  Pt is still having falls, reports this morning fell in his garden. Pt reports not going to the doctor after fall, expressed concern for hitting head, pt acknowledged but wanted to hold off. Had to cut call short as his friends came over to check on him after his fall this morning. Will call back. Pt thought he was progressing well during in-person rehab and now feels that he is not especially after his multiple falls shortly after.         Nutrition Assessments: Nutrition Assessments - 02/03/18 1451      MEDFICTS Scores   Pre Score  65       Nutrition Goals Re-Evaluation: Nutrition Goals Re-Evaluation    [place] 02/17/18 0841             Goals  Nutrition Goal  Heart healthy, low fat, low salt, more fruits and vegetables       Comment  Alex Gomez has low fat yogurt for breakfast with fruit.  He  goes to [employer] for shakes for lunch.  He is eating fish for dinner and baked potato and vegetables.    He is trying to aim for 6-8 servings a day.  He continues to watch his sodium content.  He has not been reading food labels but will start to look at them more often.  Overall, he feels that he off to good start on his diet.  Continue to stick with it.  He has not been drinking a lot of water . He usually has 1/2 tea, but knows he needs to increase his water.  He has two beers a days.        Expected Outcome  Short: Switch to drinking more water.  Long: Meet with nutritionist in March.           Nutrition Goals Discharge (Final Nutrition Goals Re-Evaluation): Nutrition Goals Re-Evaluation - 02/17/18 0841      Goals   Nutrition Goal  Heart healthy, low fat, low salt, more fruits and vegetables    Comment  Alex Gomez has low fat yogurt for breakfast with fruit.  He goes to [employer] for shakes for lunch.  He is eating fish for dinner and baked potato and vegetables.    He is trying to aim for 6-8 servings a day.  He continues to watch his sodium content.  He has not been reading food labels but will start to look at them more often.  Overall, he feels that he off to good start on his diet.  Continue to stick with it.  He has not been drinking a lot of water . He usually has 1/2 tea, but knows he needs to increase his water.  He has two beers a days.     Expected Outcome  Short: Switch to drinking more water.  Long: Meet with nutritionist in March.        Psychosocial: Target Goals: Acknowledge presence or absence of significant depression and/or stress, maximize coping skills, provide positive support system. Participant is able to verbalize types and ability to use techniques and skills needed for reducing stress and depression.   Initial Review & Psychosocial Screening: Initial Psych Review & Screening - 02/03/18 1448      Initial Review   Current issues with  Current  Psychotropic Meds;Current Stress Concerns;Current Sleep Concerns    Source of Stress Concerns  Unable to perform yard/household activities    Comments  Otila Kluver is on disability through the [place] for PTSD. He is on medication, but can get startled if someone comes up from behind him. He reports sleeping better once his doctor started him on [place]. 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 He was used to staying active, so he tries to stay connected to the community by participating in different historical events. He loves history and takes pride in his roots.       Family Dynamics   Good Support System?  Yes   wife     Barriers   Psychosocial barriers to participate in program  There are no identifiable barriers or psychosocial needs.;The patient should benefit from training in stress management and relaxation.      Screening Interventions   Interventions  Encouraged to exercise;Program counselor consult;To provide support and resources with  identified psychosocial needs;Provide feedback about the scores to participant    Expected Outcomes  Short Term goal: Utilizing psychosocial counselor, staff and physician to assist with identification of specific Stressors or current issues interfering with healing process. Setting desired goal for each stressor or current issue identified.;Long Term Goal: Stressors or current issues are controlled or eliminated.;Short Term goal: Identification and review with participant of any Quality of Life or Depression concerns found by scoring the questionnaire.;Long Term goal: The participant improves quality of Life and PHQ9 Scores as seen by post scores and/or verbalization of changes       Quality of Life Scores:  Quality of Life - 02/03/18 1451      Quality of Life   Select  Quality of Life      Quality of Life Scores   Health/Function Pre  29.03 %    Socioeconomic Pre  28.79 %    Psych/Spiritual Pre  30 %    Family Pre  30 %     GLOBAL Pre  29.3 %      Scores of 19 and below usually indicate a poorer quality of life in these areas.  A difference of  2-3 points is a clinically meaningful difference.  A difference of 2-3 points in the total score of the Quality of Life Index has been associated with significant improvement in overall quality of life, self-image, physical symptoms, and general health in studies assessing change in quality of life.  PHQ-9: Recent Review Flowsheet Data    Depression screen [facility] 2/9 02/03/2018   Decreased Interest 0   Down, Depressed, Hopeless 1   PHQ - 2 Score 1   Altered sleeping 0   Tired, decreased energy 1   Change in appetite 0   Feeling bad or failure about yourself  1   Trouble concentrating 0   Moving slowly or fidgety/restless 1   Suicidal thoughts 0   PHQ-9 Score 4   Difficult doing work/chores Not difficult at all     Interpretation of Total Score  Total Score Depression Severity:  1-4 = Minimal depression, 5-9 = Mild depression, 10-14 = Moderate depression, 15-19 = Moderately severe depression, 20-27 = Severe depression   Psychosocial Evaluation and Intervention: Psychosocial Evaluation - 02/10/18 0947      Psychosocial Evaluation & Interventions   Interventions  Stress management education;Relaxation education;Encouraged to exercise with the program and follow exercise prescription    Comments  Counselor met with Mr. Sheerin Alex Gomez) today for initial psychosocial evaluation.  He is a 73 year old who had an aaorta Valve replacement in November, 2019.  He has a strong suport system with a spouse of 12 years; friends and active involvement in his local church.  He reports being in fairly good health overall with sleeping well; a good appetite and typically in a positive mood.  He has a history of PTSD subsequent to serving in [place] and he is under the care of a psychiatrist and has attended groups for this as well.  Alex Gomez also takes medications to help him sleep and  for his PTSD and reports these work well for him.  He has some stress in his life with "worrying about what others think" often.  He has worked on this with a Social worker in the past.  Alex Gomez has goals for increasing his stamina and strength; being more heart healthy; and to improve his mental/emotional state as well.  He will be followed by staff.    Expected Outcomes  Short:  Alex Gomez will exercise consistently to increase his stamina and strength and improve his mental health.  He will continue to be followed by the [facility] for his mental health needs.  Long:  Alex Gomez will develop a routine of exercise for his health and mental health.      Continue Psychosocial Services   Follow up required by staff       Psychosocial Re-Evaluation: Psychosocial Re-Evaluation    [place] Name 03/19/18 1657 03/20/18 1021 04/17/18 1020 05/12/18 1215       Psychosocial Re-Evaluation   Current issues with  None Identified  None Identified  Current Stress Concerns  Current Stress Concerns    Comments  Counselor Learta Codding charting in [place] [facility] session: counselor contacted patient to complete re-evaluation; Patient expressed that he would prefer counselor to call back at a later time.  counselor Learta Codding charting in [place] session: counselor contacted patient to complete reevaluation:  he spoke of falling on the steps the other night but only outcome being a little sore and needing to replace a spindle on the stair case.  Patient indicated that he continues to maintain his goals that he began the program with.  Patient reported his mood being an 8 on a scale from 1 to 10 which is an imcrease from the 5 reported at initial assessment.  When counselor pointed out change, patient reported that his history of PTSD often has him encountering 'bumps' in mood and that he feels he is doing really well at present.  Alex Gomez is still having knee pain from his fall.  He was encouraged to call the doctor about his knee.  He has been getting out and  working in the yard to stay active and keep his sainty.  Overall, he is doing well and staying positive.   Alex Gomez continues to struggle with his PTSD.  He has noticed that has been more bothersome recently, but he is doing his best to cope.  Overall, he feels pretty good and appreciated the phone call to check in.     Expected Outcomes  Counselor to continue to attempt follow up.  Short: finding ways to continue to exercise outside of program; long: exploring ways to  maintain program progress in the long term  Short: Call doctor.  Long: Continue to practice self care and get out of house.   Short: Continue to work on PTSD.  Long: Continue to get out house to exercise.     Interventions  -  Encouraged to attend Pulmonary Rehabilitation for the exercise  Encouraged to attend Cardiac Rehabilitation for the exercise  Encouraged to attend Cardiac Rehabilitation for the exercise    Continue Psychosocial Services   Follow up required by counselor  Follow up required by staff  Follow up required by staff  Follow up required by staff       Psychosocial Discharge (Final Psychosocial Re-Evaluation): Psychosocial Re-Evaluation - 05/12/18 1215      Psychosocial Re-Evaluation   Current issues with  Current Stress Concerns    Comments  Alex Gomez continues to struggle with his PTSD.  He has noticed that has been more bothersome recently, but he is doing his best to cope.  Overall, he feels pretty good and appreciated the phone call to check in.     Expected Outcomes  Short: Continue to work on PTSD.  Long: Continue to get out house to exercise.     Interventions  Encouraged to attend Cardiac Rehabilitation for the exercise  Continue Psychosocial Services   Follow up required by staff       Vocational Rehabilitation: Provide vocational rehab assistance to qualifying candidates.   Vocational Rehab Evaluation & Intervention: Vocational Rehab - 02/03/18 1448      Initial Vocational Rehab Evaluation & Intervention    Assessment shows need for Vocational Rehabilitation  No       Education: Education Goals: Education classes will be provided on a variety of topics geared toward better understanding of heart health and risk factor modification. Participant will state understanding/return demonstration of topics presented as noted by education test scores.  Learning Barriers/Preferences: Learning Barriers/Preferences - 02/03/18 1445      Learning Barriers/Preferences   Learning Barriers  Hearing    Learning Preferences  Individual Instruction;Verbal Instruction       Education Topics:  AED/CPR: - Group verbal and written instruction with the use of models to demonstrate the basic use of the AED with the basic ABC's of resuscitation.   Cardiac Rehab from 03/05/2018 in [facility] Cardiac and Pulmonary Rehab  Date  02/24/18  Educator  SB  Instruction Review Code  1- Verbalizes Understanding      General Nutrition Guidelines/Fats and Fiber: -Group instruction provided by verbal, written material, models and posters to present the general guidelines for heart healthy nutrition. Gives an explanation and review of dietary fats and fiber.   Controlling Sodium/Reading Food Labels: -Group verbal and written material supporting the discussion of sodium use in heart healthy nutrition. Review and explanation with models, verbal and written materials for utilization of the food label.   Exercise Physiology & General Exercise Guidelines: - Group verbal and written instruction with models to review the exercise physiology of the cardiovascular system and associated critical values. Provides general exercise guidelines with specific guidelines to those with heart or lung disease.    Aerobic Exercise & Resistance Training: - Gives group verbal and written instruction on the various components of exercise. Focuses on aerobic and resistive training programs and the benefits of this training and how to safely progress  through these programs..   Flexibility, Balance, Mind/Body Relaxation: Provides group verbal/written instruction on the benefits of flexibility and balance training, including mind/body exercise modes such as yoga, pilates and tai chi.  Demonstration and skill practice provided.   Stress and Anxiety: - Provides group verbal and written instruction about the health risks of elevated stress and causes of high stress.  Discuss the correlation between heart/lung disease and anxiety and treatment options. Review healthy ways to manage with stress and anxiety.   Cardiac Rehab from 03/05/2018 in [facility] Cardiac and Pulmonary Rehab  Date  02/19/18  Educator  [facility]  Instruction Review Code  1- Verbalizes Understanding      Depression: - Provides group verbal and written instruction on the correlation between heart/lung disease and depressed mood, treatment options, and the stigmas associated with seeking treatment.   Cardiac Rehab from 03/05/2018 in [facility] Cardiac and Pulmonary Rehab  Date  03/05/18  Educator  KG  Instruction Review Code  1- Verbalizes Understanding      Anatomy & Physiology of the Heart: - Group verbal and written instruction and models provide basic cardiac anatomy and physiology, with the coronary electrical and arterial systems. Review of Valvular disease and Heart Failure   Cardiac Procedures: - Group verbal and written instruction to review commonly prescribed medications for heart disease. Reviews the medication, class of the drug, and side effects. Includes the steps to properly store meds and maintain  the prescription regimen. (beta blockers and nitrates)   Cardiac Rehab from 03/05/2018 in [facility] Cardiac and Pulmonary Rehab  Date  02/17/18  Educator  SB  Instruction Review Code  1- Verbalizes Understanding      Cardiac Medications I: - Group verbal and written instruction to review commonly prescribed medications for heart disease. Reviews the medication, class of the drug,  and side effects. Includes the steps to properly store meds and maintain the prescription regimen.   Cardiac Rehab from 03/05/2018 in [facility] Cardiac and Pulmonary Rehab  Date  02/10/18  Educator  SB  Instruction Review Code  1- Verbalizes Understanding      Cardiac Medications II: -Group verbal and written instruction to review commonly prescribed medications for heart disease. Reviews the medication, class of the drug, and side effects. (all other drug classes)   Cardiac Rehab from 03/05/2018 in [facility] Cardiac and Pulmonary Rehab  Date  03/03/18  Educator  SB  Instruction Review Code  1- Verbalizes Understanding       Go Sex-Intimacy & Heart Disease, Get SMART - Goal Setting: - Group verbal and written instruction through game format to discuss heart disease and the return to sexual intimacy. Provides group verbal and written material to discuss and apply goal setting through the application of the S.M.A.R.T. Method.   Cardiac Rehab from 03/05/2018 in [facility] Cardiac and Pulmonary Rehab  Date  02/17/18  Educator  SB  Instruction Review Code  1- Verbalizes Understanding      Other Matters of the Heart: - Provides group verbal, written materials and models to describe Stable Angina and Peripheral Artery. Includes description of the disease process and treatment options available to the cardiac patient.   Exercise & Equipment Safety: - Individual verbal instruction and demonstration of equipment use and safety with use of the equipment.   Cardiac Rehab from 03/05/2018 in [facility] Cardiac and Pulmonary Rehab  Date  02/03/18  Educator  [facility]  Instruction Review Code  1- Verbalizes Understanding      Infection Prevention: - Provides verbal and written material to individual with discussion of infection control including proper hand washing and proper equipment cleaning during exercise session.   Cardiac Rehab from 03/05/2018 in [facility] Cardiac and Pulmonary Rehab  Date  02/03/18  Educator  [facility]   Instruction Review Code  1- Verbalizes Understanding      Falls Prevention: - Provides verbal and written material to individual with discussion of falls prevention and safety.   Cardiac Rehab from 03/05/2018 in [facility] Cardiac and Pulmonary Rehab  Date  02/03/18  Educator  [facility]  Instruction Review Code  1- Verbalizes Understanding      Diabetes: - Individual verbal and written instruction to review signs/symptoms of diabetes, desired ranges of glucose level fasting, after meals and with exercise. Acknowledge that pre and post exercise glucose checks will be done for 3 sessions at entry of program.   Know Your Numbers and Risk Factors: -Group verbal and written instruction about important numbers in your health.  Discussion of what are risk factors and how they play a role in the disease process.  Review of Cholesterol, Blood Pressure, Diabetes, and BMI and the role they play in your overall health.   Cardiac Rehab from 03/05/2018 in [facility] Cardiac and Pulmonary Rehab  Date  03/03/18  Educator  SB  Instruction Review Code  1- Verbalizes Understanding      Sleep Hygiene: -Provides group verbal and written instruction about how sleep can affect your health.  Define sleep hygiene, discuss sleep cycles and impact of sleep habits. Review good sleep hygiene tips.    Other: -Provides group and verbal instruction on various topics (see comments)   Knowledge Questionnaire Score: Knowledge Questionnaire Score - 02/03/18 1445      Knowledge Questionnaire Score   Pre Score  23/26       Core Components/Risk Factors/Patient Goals at Admission: Personal Goals and Risk Factors at Admission - 02/03/18 1444      Core Components/Risk Factors/Patient Goals on Admission    Weight Management  Yes;Weight Loss    Intervention  Weight Management: Develop a combined nutrition and exercise program designed to reach desired caloric intake, while maintaining appropriate intake of nutrient and fiber, sodium  and fats, and appropriate energy expenditure required for the weight goal.;Weight Management: Provide education and appropriate resources to help participant work on and attain dietary goals.;Weight Management/Obesity: Establish reasonable short term and long term weight goals.    Admit Weight  236 lb (107 kg)    Goal Weight: Short Term  232 lb (105.2 kg)    Goal Weight: Long Term  225 lb (102.1 kg)    Expected Outcomes  Short Term: Continue to assess and modify interventions until short term weight is achieved;Long Term: Adherence to nutrition and physical activity/exercise program aimed toward attainment of established weight goal;Weight Loss: Understanding of general recommendations for a balanced deficit meal plan, which promotes 1-2 lb weight loss per week and includes a negative energy balance of [phone] kcal/d;Understanding recommendations for meals to include 15-35% energy as protein, 25-35% energy from fat, 35-60% energy from carbohydrates, less than 266m of dietary cholesterol, 20-35 gm of total fiber daily;Understanding of distribution of calorie intake throughout the day with the consumption of 4-5 meals/snacks    Hypertension  Yes    Intervention  Provide education on lifestyle modifcations including regular physical activity/exercise, weight management, moderate sodium restriction and increased consumption of fresh fruit, vegetables, and low fat dairy, alcohol moderation, and smoking cessation.;Monitor prescription use compliance.    Expected Outcomes  Short Term: Continued assessment and intervention until BP is < 140/945mHG in hypertensive participants. < 130/8074mG in hypertensive participants with diabetes, heart failure or chronic kidney disease.;Long Term: Maintenance of blood pressure at goal levels.    Lipids  Yes    Intervention  Provide education and support for participant on nutrition & aerobic/resistive exercise along with prescribed medications to achieve LDL <45m63mDL  >40mg52m Expected Outcomes  Short Term: Participant states understanding of desired cholesterol values and is compliant with medications prescribed. Participant is following exercise prescription and nutrition guidelines.;Long Term: Cholesterol controlled with medications as prescribed, with individualized exercise RX and with personalized nutrition plan. Value goals: LDL < 45mg,45m > 40 mg.       Core Components/Risk Factors/Patient Goals Review:  Goals and Risk Factor Review    Row Name 02/17/18 0838 03/20/18 1022 04/17/18 1023 05/12/18 1216       Core Components/Risk Factors/Patient Goals Review   Personal Goals Review  Weight Management/Obesity;Hypertension;Lipids  Weight Management/Obesity;Hypertension;Lipids  Weight Management/Obesity;Hypertension;Lipids  Weight Management/Obesity;Hypertension;Lipids    Review  Steve Alex Landryf to a good start in rehab.  He is already starting to lose weight. We talked about how working on his diet and adding home exercise will help with the weight loss too.  He is doing well with his blood pressures in class.  He has not been checking them at home, but will start and keep  a log of it to take to doctors office.   Overall, he feels that his medications are working well for him.    Alex Gomez has continued to weight daily at home.  He is down to 229 lbs today.  He has continued to work on his diet and is trying to stay active at home.  We sent out some education information today and encouraged him to check his email to see what we have been sending as he rarely checks it.  He has not been checking his blood pressure at home, but will try to start that up again.  Overall, he is doing well and staying compliant with his medications.   Alex Gomez has been able to keep his weight steady.  He is would like to continue to lose weight.  He is eating fairly well, but not exercising enough.  We talked about exercising more to help with the weight loss.  His blood pressures are doing  well and he continues to check them regularly.    Alex Gomez has been keeping his weight steady.  He feels pretty good overall.     Expected Outcomes  Short: Start to take blood pressures more at home.  Long: Continue to work on weight loss.   Short: Take blood pressures at home.  Long: Continue to work on weight loss.   Short: Exercise more to help with weight loss.  Long: Continue to monitor risk factors.   Short: Continue to try to get more exercise.  Long: Continue to monitor risk factors.        Core Components/Risk Factors/Patient Goals at Discharge (Final Review):  Goals and Risk Factor Review - 05/12/18 1216      Core Components/Risk Factors/Patient Goals Review   Personal Goals Review  Weight Management/Obesity;Hypertension;Lipids    Review  Alex Gomez has been keeping his weight steady.  He feels pretty good overall.     Expected Outcomes  Short: Continue to try to get more exercise.  Long: Continue to monitor risk factors.        ITP Comments: ITP Comments    Row Name 02/03/18 1410 02/26/18 0556 03/18/18 1439 03/26/18 1143 07/03/18 0845   ITP Comments  Med Review completed. Initial ITP created. Diagnosis can be found in [place] paperwork under media tab  30 day review. Continue with ITP unless directed changes by Medical Director chart review.  Our program is currently closed due to COVID-19.  We are communicating with patient via phone calls and emails.    30 day review. Continue with ITP unless directed changes by Medical Director chart review.  Alex Gomez returned to Cardiac Rehab now that program is open      Comments: Updated ITP

## 2018-07-03 NOTE — Progress Notes (Signed)
Daily Session Note  Patient Details  Name: Alex Gomez MRN: [id number] Date of Birth: 1945/12/31 Referring Provider:     Cardiac Rehab from 02/03/2018 in [facility] Cardiac and Pulmonary Rehab  Referring Provider  Diona Browner MD      Encounter Date: 07/03/2018  Check In: Session Check In - 07/03/18 0845      Check-In   Supervising physician immediately available to respond to emergencies  See telemetry face sheet for immediately available ER MD    Location  [facility]-Cardiac & Pulmonary Rehab    Staff Present  Renita Papa, RN BSN;Jessica Luan Pulling, MA, RCEP, CCRP, CCET;Melissa Caiola RDN, LDN;Susanne Bice, RN, BSN, CCRP;Jeanna Durrell BS, Exercise Physiologist    Virtual Visit  No    Medication changes reported      No    Fall or balance concerns reported     No    Tobacco Cessation  No Change    Warm-up and Cool-down  Performed on first and last piece of equipment    Resistance Training Performed  Yes    VAD Patient?  No    PAD/SET Patient?  No      Pain Assessment   Currently in Pain?  No/denies          Social History   Tobacco Use  Smoking Status Former Smoker  . Packs/day: 1.00  . Years: 30.00  . Pack years: 30.00  . Types: Cigarettes  . Start date: 60  . Quit date: 1999  . Years since quitting: 21.5  Smokeless Tobacco Never Used    Goals Met:  Independence with exercise equipment Exercise tolerated well No report of cardiac concerns or symptoms Strength training completed today  Goals Unmet:  Not Applicable  Comments: Pt able to follow exercise prescription today without complaint.  Will continue to monitor for progression.    Dr. Emily Filbert is Medical Director for [place] and [facility].

## 2018-07-07 ENCOUNTER — Ambulatory Visit: Payer: No Typology Code available for payment source

## 2018-07-07 ENCOUNTER — Other Ambulatory Visit: Payer: Self-pay

## 2018-07-07 ENCOUNTER — Encounter: Payer: No Typology Code available for payment source | Admitting: *Deleted

## 2018-07-07 DIAGNOSIS — Z952 Presence of prosthetic heart valve: Secondary | ICD-10-CM | POA: Diagnosis not present

## 2018-07-07 NOTE — Progress Notes (Signed)
Daily Session Note  Patient Details  Name: Alex Gomez MRN: [id number] Date of Birth: 1945-08-30 Referring Provider:     Cardiac Rehab from 02/03/2018 in [facility] Cardiac and Pulmonary Rehab  Referring Provider  Diona Browner MD      Encounter Date: 07/07/2018  Check In: Session Check In - 07/07/18 0905      Check-In   Supervising physician immediately available to respond to emergencies  See telemetry face sheet for immediately available ER MD    Location  [facility]-Cardiac & Pulmonary Rehab    Staff Present  Heath Lark, RN, BSN, Laveda Norman, BS, ACSM CEP, Exercise Physiologist;Gaylene Moylan [place], MA, RCEP, CCRP, CCET;Joseph Hoke [employer]    Virtual Visit  No    Medication changes reported      No    Fall or balance concerns reported     No    Warm-up and Cool-down  Performed on first and last piece of equipment    Resistance Training Performed  Yes    VAD Patient?  No    PAD/SET Patient?  No      Pain Assessment   Currently in Pain?  No/denies          Social History   Tobacco Use  Smoking Status Former Smoker  . Packs/day: 1.00  . Years: 30.00  . Pack years: 30.00  . Types: Cigarettes  . Start date: 31  . Quit date: 1999  . Years since quitting: 21.5  Smokeless Tobacco Never Used    Goals Met:  Independence with exercise equipment Exercise tolerated well No report of cardiac concerns or symptoms Strength training completed today  Goals Unmet:  Not Applicable  Comments: Pt able to follow exercise prescription today without complaint.  Will continue to monitor for progression.    Dr. Emily Filbert is Medical Director for [place] and [facility].

## 2018-07-09 ENCOUNTER — Other Ambulatory Visit: Payer: Self-pay

## 2018-07-09 ENCOUNTER — Encounter: Payer: No Typology Code available for payment source | Admitting: *Deleted

## 2018-07-09 DIAGNOSIS — Z952 Presence of prosthetic heart valve: Secondary | ICD-10-CM | POA: Diagnosis not present

## 2018-07-09 NOTE — Progress Notes (Signed)
Daily Session Note  Patient Details  Name: Alex Gomez MRN: [id number] Date of Birth: August 20, 1945 Referring Provider:     Cardiac Rehab from 02/03/2018 in [facility] Cardiac and Pulmonary Rehab  Referring Provider  Diona Browner MD      Encounter Date: 07/09/2018  Check In: Session Check In - 07/09/18 1127      Check-In   Supervising physician immediately available to respond to emergencies  See telemetry face sheet for immediately available ER MD    Location  [facility]-Cardiac & Pulmonary Rehab    Staff Present  Heath Lark, RN, BSN, CCRP;Melissa Caiola RDN, LDN;Joseph Hood RCP,RRT,BSRT;Jeanna Durrell BS, Exercise Physiologist    Virtual Visit  No    Medication changes reported      No    Fall or balance concerns reported     No    Tobacco Cessation  No Change    Warm-up and Cool-down  Performed on first and last piece of equipment    Resistance Training Performed  Yes    VAD Patient?  No      Pain Assessment   Currently in Pain?  No/denies          Social History   Tobacco Use  Smoking Status Former Smoker  . Packs/day: 1.00  . Years: 30.00  . Pack years: 30.00  . Types: Cigarettes  . Start date: 89  . Quit date: 1999  . Years since quitting: 21.5  Smokeless Tobacco Never Used    Goals Met:  Independence with exercise equipment Exercise tolerated well No report of cardiac concerns or symptoms Strength training completed today  Goals Unmet:  Not Applicable  Comments: Pt able to follow exercise prescription today without complaint.  Will continue to monitor for progression.    Dr. Emily Filbert is Medical Director for [place] and [facility].

## 2018-07-16 ENCOUNTER — Other Ambulatory Visit: Payer: Self-pay

## 2018-07-16 ENCOUNTER — Encounter: Payer: No Typology Code available for payment source | Admitting: *Deleted

## 2018-07-16 ENCOUNTER — Encounter: Payer: Self-pay | Admitting: *Deleted

## 2018-07-16 DIAGNOSIS — Z952 Presence of prosthetic heart valve: Secondary | ICD-10-CM | POA: Diagnosis not present

## 2018-07-16 NOTE — Progress Notes (Signed)
Cardiac Individual Treatment Plan  Patient Details  Name: Alex Gomez MRN: [id number] Date of Birth: [birth year] Referring Provider:     Cardiac Rehab from 02/03/2018 in [facility] Cardiac and Pulmonary Rehab  Referring Provider  Diona Browner MD      Initial Encounter Date:    Cardiac Rehab from 02/03/2018 in [facility] Cardiac and Pulmonary Rehab  Date  02/03/18      Visit Diagnosis: S/P AVR (aortic valve replacement)  Patient's Home Medications on Admission:  Current Outpatient Medications:  .  amLODipine (NORVASC) 10 MG tablet, Take by mouth., Disp: , Rfl:  .  aspirin EC 81 MG tablet, Take by mouth., Disp: , Rfl:  .  benzonatate (TESSALON) 200 MG capsule, Take 200 mg by mouth at bedtime as needed for cough., Disp: , Rfl:  .  carboxymethylcellulose (REFRESH PLUS) 0.5 % SOLN, 1 drop 3 (three) times daily as needed., Disp: , Rfl:  .  Cholecalciferol 25 MCG (1000 UT) tablet, Take 1,000 Units by mouth daily., Disp: , Rfl:  .  finasteride (PROSCAR) 5 MG tablet, Take by mouth., Disp: , Rfl:  .  fluticasone (FLONASE) 50 MCG/ACT nasal spray, Place into the nose., Disp: , Rfl:  .  furosemide (LASIX) 40 MG tablet, Take by mouth., Disp: , Rfl:  .  guaiFENesin-codeine 100-10 MG/5ML syrup, Take by mouth., Disp: , Rfl:  .  oxybutynin (DITROPAN-XL) 5 MG 24 hr tablet, Take by mouth., Disp: , Rfl:  .  prazosin (MINIPRESS) 5 MG capsule, Take by mouth., Disp: , Rfl:  .  sennosides-docusate sodium (SENOKOT-S) 8.6-50 MG tablet, Take 2 tablets by mouth daily., Disp: , Rfl:  .  triamcinolone ointment (KENALOG) 0.1 %, Frequency:BID   Dosage:0.0     Instructions:  Note:Dose: 0.1%, Disp: , Rfl:  .  venlafaxine XR (EFFEXOR-XR) 150 MG 24 hr capsule, Take by mouth., Disp: , Rfl:  .  zolpidem (AMBIEN CR) 12.5 MG CR tablet, Take by mouth., Disp: , Rfl:   Past Medical History: No past medical history on file.  Tobacco Use: Social History   Tobacco Use  Smoking Status Former Smoker  . Packs/day: 1.00  . Years:  30.00  . Pack years: 30.00  . Types: Cigarettes  . Start date: 40  . Quit date: 1999  . Years since quitting: 21.5  Smokeless Tobacco Never Used    Labs: Recent Review Flowsheet Data    There is no flowsheet data to display.       Exercise Target Goals: Exercise Program Goal: Individual exercise prescription set using results from initial 6 min walk test and THRR while considering  patient's activity barriers and safety.   Exercise Prescription Goal: Initial exercise prescription builds to 30-45 minutes a day of aerobic activity, 2-3 days per week.  Home exercise guidelines will be given to patient during program as part of exercise prescription that the participant will acknowledge.  Activity Barriers & Risk Stratification: Activity Barriers & Cardiac Risk Stratification - 02/03/18 1342      Activity Barriers & Cardiac Risk Stratification   Activity Barriers  Joint Problems;Deconditioning;Muscular Weakness;Balance Concerns   Occassional knee pain   Cardiac Risk Stratification  High       6 Minute Walk: 6 Minute Walk    Row Name 02/03/18 1341         6 Minute Walk   Phase  Initial     Distance  1400 feet     Walk Time  6 minutes     # of  Rest Breaks  0     MPH  2.65     METS  11.26     RPE  11     VO2 Peak  3.22     Symptoms  No     Resting HR  78 bpm     Resting BP  134/66     Resting Oxygen Saturation   95 %     Exercise Oxygen Saturation  during 6 min walk  97 %     Max Ex. HR  114 bpm     Max Ex. BP  154/82     2 Minute Post BP  122/64        Oxygen Initial Assessment:   Oxygen Re-Evaluation:   Oxygen Discharge (Final Oxygen Re-Evaluation):   Initial Exercise Prescription: Initial Exercise Prescription - 02/03/18 1300      Date of Initial Exercise RX and Referring Provider   Date  02/03/18    Referring Provider  Diona Browner MD      Treadmill   MPH  2.5    Grade  0.5    Minutes  15    METs  3.09      Recumbant Bike   Level  3     RPM  50    Watts  41    Minutes  15    METs  3      T5 Nustep   Level  3    SPM  80    Minutes  15    METs  3      Prescription Details   Frequency (times per week)  3    Duration  Progress to 45 minutes of aerobic exercise without signs/symptoms of physical distress      Intensity   THRR 40-80% of Max Heartrate  107-136    Ratings of Perceived Exertion  11-13    Perceived Dyspnea  0-4      Progression   Progression  Continue to progress workloads to maintain intensity without signs/symptoms of physical distress.      Resistance Training   Training Prescription  Yes    Weight  3 lbs    Reps  10-15       Perform Capillary Blood Glucose checks as needed.  Exercise Prescription Changes: Exercise Prescription Changes    Row Name 02/03/18 1300 02/19/18 1700 03/05/18 1400 03/18/18 1400       Response to Exercise   Blood Pressure (Admit)  134/66  130/60  116/58  150/66    Blood Pressure (Exercise)  154/82  122/68  146/64  128/54    Blood Pressure (Exit)  122/64  116/60  122/60  114/66    Heart Rate (Admit)  78 bpm  72 bpm  109 bpm  94 bpm    Heart Rate (Exercise)  114 bpm  120 bpm  131 bpm  98 bpm    Heart Rate (Exit)  83 bpm  89 bpm  100 bpm  68 bpm    Oxygen Saturation (Admit)  95 %  -  -  -    Oxygen Saturation (Exercise)  97 %  -  -  -    Rating of Perceived Exertion (Exercise)  _0 Symptoms  none  none  none  none    Comments  walk test results  -  -  -    Duration  -  Progress to 45 minutes of aerobic  exercise without signs/symptoms of physical distress takes rest breaks on treadmill  Progress to 45 minutes of aerobic exercise without signs/symptoms of physical distress takes rest breaks on treadmill  Progress to 45 minutes of aerobic exercise without signs/symptoms of physical distress takes rest breaks on treadmill    Intensity  -  THRR unchanged  THRR unchanged  THRR unchanged      Progression   Progression  -  Continue to progress workloads to  maintain intensity without signs/symptoms of physical distress.  Continue to progress workloads to maintain intensity without signs/symptoms of physical distress.  Continue to progress workloads to maintain intensity without signs/symptoms of physical distress.    Average METs  -  2.61  2.43  2.48      Resistance Training   Training Prescription  -  Yes  Yes  Yes    Weight  -  5 lbs  4 lbs  4 lbs    Reps  -  10-15  10-15  10-15      Interval Training   Interval Training  -  No  No  No      Treadmill   MPH  -  2.5  1.9  1.9    Grade  -  0.5  0.5  0.5    Minutes  -  15 occasional rest breaks or stops early  15 occasional rest breaks or stops early  15 occasional rest breaks or stops early    METs  -  3.09  2.59  2.59      Recumbant Bike   Level  -  _0 Watts  -  _1 Minutes  -  _2 METs  -  2.76  2.76  2.86      T5 Nustep   Level  -  _3 Minutes  -  _4 METs  -  _5 Home Exercise Plan   Plans to continue exercise at  -  Home (comment) walking  Home (comment) walking  Home (comment) walking    Frequency  -  Add 2 additional days to program exercise sessions.  Add 2 additional days to program exercise sessions.  Add 2 additional days to program exercise sessions.    Initial Home Exercises Provided  -  02/17/18  02/17/18  02/17/18       Exercise Comments: Exercise Comments    Row Name 02/07/18 0736           Exercise Comments  First full day of exercise!  Patient was oriented to gym and equipment including functions, settings, policies, and procedures.  Patient's individual exercise prescription and treatment plan were reviewed.  All starting workloads were established based on the results of the 6 minute walk test done at initial orientation visit.  The plan for exercise progression was also introduced and progression will be customized based on patient's performance and goals.          Exercise Goals and Review: Exercise  Goals    Row Name 02/03/18 1345             Exercise Goals   Increase Physical Activity  Yes       Intervention  Provide advice, education, support and counseling about physical activity/exercise needs.;Develop an individualized exercise  prescription for aerobic and resistive training based on initial evaluation findings, risk stratification, comorbidities and participant's personal goals.       Expected Outcomes  Short Term: Attend rehab on a regular basis to increase amount of physical activity.;Long Term: Add in home exercise to make exercise part of routine and to increase amount of physical activity.;Long Term: Exercising regularly at least 3-5 days a week.       Increase Strength and Stamina  Yes       Intervention  Develop an individualized exercise prescription for aerobic and resistive training based on initial evaluation findings, risk stratification, comorbidities and participant's personal goals.;Provide advice, education, support and counseling about physical activity/exercise needs.       Expected Outcomes  Short Term: Increase workloads from initial exercise prescription for resistance, speed, and METs.;Short Term: Perform resistance training exercises routinely during rehab and add in resistance training at home;Long Term: Improve cardiorespiratory fitness, muscular endurance and strength as measured by increased METs and functional capacity (6MWT)       Able to understand and use rate of perceived exertion (RPE) scale  Yes       Intervention  Provide education and explanation on how to use RPE scale       Expected Outcomes  Long Term:  Able to use RPE to guide intensity level when exercising independently;Short Term: Able to use RPE daily in rehab to express subjective intensity level       Knowledge and understanding of Target Heart Rate Range (THRR)  Yes       Intervention  Provide education and explanation of THRR including how the numbers were predicted and where they are  located for reference       Expected Outcomes  Short Term: Able to state/look up THRR;Long Term: Able to use THRR to govern intensity when exercising independently;Short Term: Able to use daily as guideline for intensity in rehab       Able to check pulse independently  Yes       Intervention  Provide education and demonstration on how to check pulse in carotid and radial arteries.;Review the importance of being able to check your own pulse for safety during independent exercise       Expected Outcomes  Short Term: Able to explain why pulse checking is important during independent exercise;Long Term: Able to check pulse independently and accurately       Understanding of Exercise Prescription  Yes       Intervention  Provide education, explanation, and written materials on patient's individual exercise prescription       Expected Outcomes  Short Term: Able to explain program exercise prescription;Long Term: Able to explain home exercise prescription to exercise independently          Exercise Goals Re-Evaluation : Exercise Goals Re-Evaluation    Row Name 02/07/18 0736 02/17/18 0846 03/05/18 1441 03/18/18 1439 03/20/18 1025     Exercise Goal Re-Evaluation   Exercise Goals Review  Increase Physical Activity;Increase Strength and Stamina;Able to understand and use rate of perceived exertion (RPE) scale;Knowledge and understanding of Target Heart Rate Range (THRR);Understanding of Exercise Prescription  Increase Physical Activity;Increase Strength and Stamina;Understanding of Exercise Prescription  Increase Physical Activity;Increase Strength and Stamina;Understanding of Exercise Prescription  Increase Physical Activity;Increase Strength and Stamina;Understanding of Exercise Prescription  Increase Physical Activity;Increase Strength and Stamina;Understanding of Exercise Prescription   Comments  Reviewed RPE scale, THR and program prescription with pt today.  Pt voiced understanding and was given a  copy  of goals to take home.   Richardson Landry is doing well in rehab.  He feels like his strength and stamina is getting better.  His first day is rough, but since then he has been doing a lot better. Reviewed home exercise with pt today.  Pt plans to walking at home for exercise.  Reviewed THR, pulse, RPE, sign and symptoms, and when to call 911 or MD.  Also discussed weather considerations and indoor options.  Pt voiced understanding.  Richardson Landry continues to do well in rehab.  He is on level 5 of the bike and up to 1.9 on the treadmill more consistently.  We will continue to monitor his progress.   Richardson Landry has been doing well in rehab.  He was out sick the previous week.  He was able to return to his workloads.  He will be walking at home.  We will continue to monitor his progress at home.   Richardson Landry has been trying to stay active at home. However, he is not really getting in cardio but rather just activity.  I encouraged him to check his email more frequently and to try the videos that we sent out yesterday.  He said that he would try.  Overall, he is feeling stronger and has more stamina.      Expected Outcomes  Short: Use RPE daily to regulate intensity. Long: Follow program prescription in THR.  Short: Start to walk at home on off days.  Long: Continue to increase strength and stamina.   Short: Increase treadmill incline.  Long: Continue to increase strength and stamina.   Short: Walk at home   Long: Continue to increase strength and stamina.   Short: Walk more and try out our videos.  Long: Continue to increase strength and stamina.    Rodney Name 04/01/18 1154 04/17/18 1018 05/12/18 1214         Exercise Goal Re-Evaluation   Exercise Goals Review  Increase Physical Activity;Increase Strength and Stamina;Understanding of Exercise Prescription  Increase Physical Activity;Increase Strength and Stamina;Understanding of Exercise Prescription  Increase Physical Activity;Increase Strength and Stamina;Understanding of Exercise  Prescription     Comments  Richardson Landry has been doing well at home.  He feel down the stairs last week.  He thinks it was from the numbeness that he gets in his legs on occasion. Since his fall, he has not been out walking but hopes to get back to it.  I encouraged him to use the videos to exercise, especially the chair exercises as he recovers from fall.   Richardson Landry is still struggling with his knee from the fall.  He has not called the doctor but was encouraged to do so.  He will call his PCP to see what they say.  It has kept him from walking a lot.  He has been staying active by working out in the yard.  He was encouraged to use the videos and do chair exercises again.   Richardson Landry has been doing some exercise. He is trying to walk some.  He continues to get out to work in the yard.      Expected Outcomes  Short: Try to use the chair exercise videos.  Long: Continue to rebuild strength and stamina.   Short: Call doctor about knee.  Long: Continue to move more.   Short: Continue to try to get in more exercise.  Long: Continue to increase activity levels.         Discharge Exercise Prescription (Final Exercise  Prescription Changes): Exercise Prescription Changes - 03/18/18 1400      Response to Exercise   Blood Pressure (Admit)  150/66    Blood Pressure (Exercise)  128/54    Blood Pressure (Exit)  114/66    Heart Rate (Admit)  94 bpm    Heart Rate (Exercise)  98 bpm    Heart Rate (Exit)  68 bpm    Rating of Perceived Exertion (Exercise)  13    Symptoms  none    Duration  Progress to 45 minutes of aerobic exercise without signs/symptoms of physical distress   takes rest breaks on treadmill   Intensity  THRR unchanged      Progression   Progression  Continue to progress workloads to maintain intensity without signs/symptoms of physical distress.    Average METs  2.48      Resistance Training   Training Prescription  Yes    Weight  4 lbs    Reps  10-15      Interval Training   Interval Training  No       Treadmill   MPH  1.9    Grade  0.5    Minutes  15   occasional rest breaks or stops early   METs  2.59      Recumbant Bike   Level  5    Watts  30    Minutes  15    METs  2.86      T5 Nustep   Level  4    Minutes  15    METs  2      Home Exercise Plan   Plans to continue exercise at  Home (comment)   walking   Frequency  Add 2 additional days to program exercise sessions.    Initial Home Exercises Provided  02/17/18       Nutrition:  Target Goals: Understanding of nutrition guidelines, daily intake of sodium '1500mg'$ , cholesterol '200mg'$ , calories 30% from fat and 7% or less from saturated fats, daily to have 5 or more servings of fruits and vegetables.  Biometrics: Pre Biometrics - 02/03/18 1345      Pre Biometrics   Height  6' 0.6" (1.844 m)    Weight  236 lb (107 kg)    Waist Circumference  41 inches    Hip Circumference  42 inches    Waist to Hip Ratio  0.98 %    BMI (Calculated)  31.48    Single Leg Stand  2.96 seconds        Nutrition Therapy Plan and Nutrition Goals: Nutrition Therapy & Goals - 06/11/18 1046      Personal Nutrition Goals   Comments  Pt is still having falls, reports this morning fell in his garden. Pt reports not going to the doctor after fall, expressed concern for hitting head, pt acknowledged but wanted to hold off. Had to cut call short as his friends came over to check on him after his fall this morning. Will call back. Pt thought he was progressing well during in-person rehab and now feels that he is not especially after his multiple falls shortly after.         Nutrition Assessments: Nutrition Assessments - 02/03/18 1451      MEDFICTS Scores   Pre Score  65       Nutrition Goals Re-Evaluation: Nutrition Goals Re-Evaluation    Row Name 02/17/18 0841 07/16/18 0908           Goals  Nutrition Goal  Heart healthy, low fat, low salt, more fruits and vegetables  ST: move down to one drink before dinner (liquor) LT:  continue with [place] eating      Comment  Richardson Landry has low fat yogurt for breakfast with fruit.  He goes to [employer] for shakes for lunch.  He is eating fish for dinner and baked potato and vegetables.    He is trying to aim for 6-8 servings a day.  He continues to watch his sodium content.  He has not been reading food labels but will start to look at them more often.  Overall, he feels that he off to good start on his diet.  Continue to stick with it.  He has not been drinking a lot of water . He usually has 1/2 tea, but knows he needs to increase his water.  He has two beers a days.   Richardson Landry reports his wife is taking care of him and his nutrition "like a new born baby". Pt reports following HH diet with help from his wife B: granola (almonds, oats, currants) w/ chobani greek yogurt and fruit (berries, bananas, etc) . L: nutrition shake from [employer]. D: grilled chicken or salmon with broccoli or a salad. Pt reports that he wants to work on his drinking, he drinks a couple of drinks of liquor before dinner (his wife does not know) and his [facility] told him he shouldn't and he would also like to cut back. He reports that he wouild like to smoke but does not.      Expected Outcome  Short: Switch to drinking more water.  Long: Meet with nutritionist in March.   ST: move down to one drink before dinner (liquor) LT: continue with [facility] eating         Nutrition Goals Discharge (Final Nutrition Goals Re-Evaluation): Nutrition Goals Re-Evaluation - 07/16/18 0908      Goals   Nutrition Goal  ST: move down to one drink before dinner (liquor) LT: continue with [place] eating    Comment  Richardson Landry reports his wife is taking care of him and his nutrition "like a new born baby". Pt reports following HH diet with help from his wife B: granola (almonds, oats, currants) w/ chobani greek yogurt and fruit (berries, bananas, etc) . L: nutrition shake from [employer]. D: grilled chicken or salmon with broccoli or  a salad. Pt reports that he wants to work on his drinking, he drinks a couple of drinks of liquor before dinner (his wife does not know) and his [facility] MD told him he shouldn't and he would also like to cut back. He reports that he wouild like to smoke but does not.    Expected Outcome  ST: move down to one drink before dinner (liquor) LT: continue with HH eating       Psychosocial: Target Goals: Acknowledge presence or absence of significant depression and/or stress, maximize coping skills, provide positive support system. Participant is able to verbalize types and ability to use techniques and skills needed for reducing stress and depression.   Initial Review & Psychosocial Screening: Initial Psych Review & Screening - 02/03/18 1448      Initial Review   Current issues with  Current Psychotropic Meds;Current Stress Concerns;Current Sleep Concerns    Source of Stress Concerns  Unable to perform yard/household activities    Comments  Otila Kluver is on disability through the [place] for PTSD. He is on medication, but can  get startled if someone comes up from behind him. He reports sleeping better once his doctor started him on [place]. 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 He was used to staying active, so he tries to stay connected to the community by participating in different historical events. He loves history and takes pride in his roots.       Family Dynamics   Good Support System?  Yes   wife     Barriers   Psychosocial barriers to participate in program  There are no identifiable barriers or psychosocial needs.;The patient should benefit from training in stress management and relaxation.      Screening Interventions   Interventions  Encouraged to exercise;Program counselor consult;To provide support and resources with identified psychosocial needs;Provide feedback about the scores to participant    Expected Outcomes  Short Term goal: Utilizing psychosocial  counselor, staff and physician to assist with identification of specific Stressors or current issues interfering with healing process. Setting desired goal for each stressor or current issue identified.;Long Term Goal: Stressors or current issues are controlled or eliminated.;Short Term goal: Identification and review with participant of any Quality of Life or Depression concerns found by scoring the questionnaire.;Long Term goal: The participant improves quality of Life and PHQ9 Scores as seen by post scores and/or verbalization of changes       Quality of Life Scores:  Quality of Life - 02/03/18 1451      Quality of Life   Select  Quality of Life      Quality of Life Scores   Health/Function Pre  29.03 %    Socioeconomic Pre  28.79 %    Psych/Spiritual Pre  30 %    Family Pre  30 %    GLOBAL Pre  29.3 %      Scores of 19 and below usually indicate a poorer quality of life in these areas.  A difference of  2-3 points is a clinically meaningful difference.  A difference of 2-3 points in the total score of the Quality of Life Index has been associated with significant improvement in overall quality of life, self-image, physical symptoms, and general health in studies assessing change in quality of life.  PHQ-9: Recent Review Flowsheet Data    Depression screen [facility] 2/9 02/03/2018   Decreased Interest 0   Down, Depressed, Hopeless 1   PHQ - 2 Score 1   Altered sleeping 0   Tired, decreased energy 1   Change in appetite 0   Feeling bad or failure about yourself  1   Trouble concentrating 0   Moving slowly or fidgety/restless 1   Suicidal thoughts 0   PHQ-9 Score 4   Difficult doing work/chores Not difficult at all     Interpretation of Total Score  Total Score Depression Severity:  1-4 = Minimal depression, 5-9 = Mild depression, 10-14 = Moderate depression, 15-19 = Moderately severe depression, 20-27 = Severe depression   Psychosocial Evaluation and Intervention: Psychosocial  Evaluation - 02/10/18 0947      Psychosocial Evaluation & Interventions   Interventions  Stress management education;Relaxation education;Encouraged to exercise with the program and follow exercise prescription    Comments  Counselor met with Mr. Portman Richardson Landry) today for initial psychosocial evaluation.  He is a 73 year old who had an aaorta Valve replacement in November, 2019.  He has a strong suport system with a spouse of 12 years; friends and active involvement in his  local church.  He reports being in fairly good health overall with sleeping well; a good appetite and typically in a positive mood.  He has a history of PTSD subsequent to serving in [place] and he is under the care of a psychiatrist and has attended groups for this as well.  Richardson Landry also takes medications to help him sleep and for his PTSD and reports these work well for him.  He has some stress in his life with "worrying about what others think" often.  He has worked on this with a Social worker in the past.  Richardson Landry has goals for increasing his stamina and strength; being more heart healthy; and to improve his mental/emotional state as well.  He will be followed by staff.    Expected Outcomes  Short:  Richardson Landry will exercise consistently to increase his stamina and strength and improve his mental health.  He will continue to be followed by the [place] for his mental health needs.  Long:  Richardson Landry will develop a routine of exercise for his health and mental health.      Continue Psychosocial Services   Follow up required by staff       Psychosocial Re-Evaluation: Psychosocial Re-Evaluation    [place] Name 03/19/18 1657 03/20/18 1021 04/17/18 1020 05/12/18 1215       Psychosocial Re-Evaluation   Current issues with  None Identified  None Identified  Current Stress Concerns  Current Stress Concerns    Comments  Counselor Learta Codding charting in [place] [facility] session: counselor contacted patient to complete re-evaluation; Patient expressed that he would prefer  counselor to call back at a later time.  counselor Learta Codding charting in [place] session: counselor contacted patient to complete reevaluation:  he spoke of falling on the steps the other night but only outcome being a little sore and needing to replace a spindle on the stair case.  Patient indicated that he continues to maintain his goals that he began the program with.  Patient reported his mood being an 8 on a scale from 1 to 10 which is an imcrease from the 5 reported at initial assessment.  When counselor pointed out change, patient reported that his history of PTSD often has him encountering 'bumps' in mood and that he feels he is doing really well at present.  Richardson Landry is still having knee pain from his fall.  He was encouraged to call the doctor about his knee.  He has been getting out and working in the yard to stay active and keep his sainty.  Overall, he is doing well and staying positive.   Richardson Landry continues to struggle with his PTSD.  He has noticed that has been more bothersome recently, but he is doing his best to cope.  Overall, he feels pretty good and appreciated the phone call to check in.     Expected Outcomes  Counselor to continue to attempt follow up.  Short: finding ways to continue to exercise outside of program; long: exploring ways to  maintain program progress in the long term  Short: Call doctor.  Long: Continue to practice self care and get out of house.   Short: Continue to work on PTSD.  Long: Continue to get out house to exercise.     Interventions  -  Encouraged to attend Pulmonary Rehabilitation for the exercise  Encouraged to attend Cardiac Rehabilitation for the exercise  Encouraged to attend Cardiac Rehabilitation for the exercise    Continue Psychosocial Services   Follow  up required by counselor  Follow up required by staff  Follow up required by staff  Follow up required by staff       Psychosocial Discharge (Final Psychosocial Re-Evaluation): Psychosocial Re-Evaluation  - 05/12/18 1215      Psychosocial Re-Evaluation   Current issues with  Current Stress Concerns    Comments  Richardson Landry continues to struggle with his PTSD.  He has noticed that has been more bothersome recently, but he is doing his best to cope.  Overall, he feels pretty good and appreciated the phone call to check in.     Expected Outcomes  Short: Continue to work on PTSD.  Long: Continue to get out house to exercise.     Interventions  Encouraged to attend Cardiac Rehabilitation for the exercise    Continue Psychosocial Services   Follow up required by staff       Vocational Rehabilitation: Provide vocational rehab assistance to qualifying candidates.   Vocational Rehab Evaluation & Intervention: Vocational Rehab - 02/03/18 1448      Initial Vocational Rehab Evaluation & Intervention   Assessment shows need for Vocational Rehabilitation  No       Education: Education Goals: Education classes will be provided on a variety of topics geared toward better understanding of heart health and risk factor modification. Participant will state understanding/return demonstration of topics presented as noted by education test scores.  Learning Barriers/Preferences: Learning Barriers/Preferences - 02/03/18 1445      Learning Barriers/Preferences   Learning Barriers  Hearing    Learning Preferences  Individual Instruction;Verbal Instruction       Education Topics:  AED/CPR: - Group verbal and written instruction with the use of models to demonstrate the basic use of the AED with the basic ABC's of resuscitation.   Cardiac Rehab from 03/05/2018 in [facility] Cardiac and Pulmonary Rehab  Date  02/24/18  Educator  SB  Instruction Review Code  1- Verbalizes Understanding      General Nutrition Guidelines/Fats and Fiber: -Group instruction provided by verbal, written material, models and posters to present the general guidelines for heart healthy nutrition. Gives an explanation and review of dietary  fats and fiber.   Controlling Sodium/Reading Food Labels: -Group verbal and written material supporting the discussion of sodium use in heart healthy nutrition. Review and explanation with models, verbal and written materials for utilization of the food label.   Exercise Physiology & General Exercise Guidelines: - Group verbal and written instruction with models to review the exercise physiology of the cardiovascular system and associated critical values. Provides general exercise guidelines with specific guidelines to those with heart or lung disease.    Aerobic Exercise & Resistance Training: - Gives group verbal and written instruction on the various components of exercise. Focuses on aerobic and resistive training programs and the benefits of this training and how to safely progress through these programs..   Flexibility, Balance, Mind/Body Relaxation: Provides group verbal/written instruction on the benefits of flexibility and balance training, including mind/body exercise modes such as yoga, pilates and tai chi.  Demonstration and skill practice provided.   Stress and Anxiety: - Provides group verbal and written instruction about the health risks of elevated stress and causes of high stress.  Discuss the correlation between heart/lung disease and anxiety and treatment options. Review healthy ways to manage with stress and anxiety.   Cardiac Rehab from 03/05/2018 in [facility] Cardiac and Pulmonary Rehab  Date  02/19/18  Educator  [facility]  Instruction Review Code  1-  Verbalizes Understanding      Depression: - Provides group verbal and written instruction on the correlation between heart/lung disease and depressed mood, treatment options, and the stigmas associated with seeking treatment.   Cardiac Rehab from 03/05/2018 in [facility] Cardiac and Pulmonary Rehab  Date  03/05/18  Educator  KG  Instruction Review Code  1- Verbalizes Understanding      Anatomy & Physiology of the Heart: - Group  verbal and written instruction and models provide basic cardiac anatomy and physiology, with the coronary electrical and arterial systems. Review of Valvular disease and Heart Failure   Cardiac Procedures: - Group verbal and written instruction to review commonly prescribed medications for heart disease. Reviews the medication, class of the drug, and side effects. Includes the steps to properly store meds and maintain the prescription regimen. (beta blockers and nitrates)   Cardiac Rehab from 03/05/2018 in [facility] Cardiac and Pulmonary Rehab  Date  02/17/18  Educator  SB  Instruction Review Code  1- Verbalizes Understanding      Cardiac Medications I: - Group verbal and written instruction to review commonly prescribed medications for heart disease. Reviews the medication, class of the drug, and side effects. Includes the steps to properly store meds and maintain the prescription regimen.   Cardiac Rehab from 03/05/2018 in [facility] Cardiac and Pulmonary Rehab  Date  02/10/18  Educator  SB  Instruction Review Code  1- Verbalizes Understanding      Cardiac Medications II: -Group verbal and written instruction to review commonly prescribed medications for heart disease. Reviews the medication, class of the drug, and side effects. (all other drug classes)   Cardiac Rehab from 03/05/2018 in [facility] Cardiac and Pulmonary Rehab  Date  03/03/18  Educator  SB  Instruction Review Code  1- Verbalizes Understanding       Go Sex-Intimacy & Heart Disease, Get SMART - Goal Setting: - Group verbal and written instruction through game format to discuss heart disease and the return to sexual intimacy. Provides group verbal and written material to discuss and apply goal setting through the application of the S.M.A.R.T. Method.   Cardiac Rehab from 03/05/2018 in [facility] Cardiac and Pulmonary Rehab  Date  02/17/18  Educator  SB  Instruction Review Code  1- Verbalizes Understanding      Other Matters of the Heart: -  Provides group verbal, written materials and models to describe Stable Angina and Peripheral Artery. Includes description of the disease process and treatment options available to the cardiac patient.   Exercise & Equipment Safety: - Individual verbal instruction and demonstration of equipment use and safety with use of the equipment.   Cardiac Rehab from 03/05/2018 in [facility] Cardiac and Pulmonary Rehab  Date  02/03/18  Educator  [facility]  Instruction Review Code  1- Verbalizes Understanding      Infection Prevention: - Provides verbal and written material to individual with discussion of infection control including proper hand washing and proper equipment cleaning during exercise session.   Cardiac Rehab from 03/05/2018 in [facility] Cardiac and Pulmonary Rehab  Date  02/03/18  Educator  [facility]  Instruction Review Code  1- Verbalizes Understanding      Falls Prevention: - Provides verbal and written material to individual with discussion of falls prevention and safety.   Cardiac Rehab from 03/05/2018 in [facility] Cardiac and Pulmonary Rehab  Date  02/03/18  Educator  [facility]  Instruction Review Code  1- Verbalizes Understanding      Diabetes: - Individual verbal and written  instruction to review signs/symptoms of diabetes, desired ranges of glucose level fasting, after meals and with exercise. Acknowledge that pre and post exercise glucose checks will be done for 3 sessions at entry of program.   Know Your Numbers and Risk Factors: -Group verbal and written instruction about important numbers in your health.  Discussion of what are risk factors and how they play a role in the disease process.  Review of Cholesterol, Blood Pressure, Diabetes, and BMI and the role they play in your overall health.   Cardiac Rehab from 03/05/2018 in [facility] Cardiac and Pulmonary Rehab  Date  03/03/18  Educator  SB  Instruction Review Code  1- Verbalizes Understanding      Sleep Hygiene: -Provides group verbal and written  instruction about how sleep can affect your health.  Define sleep hygiene, discuss sleep cycles and impact of sleep habits. Review good sleep hygiene tips.    Other: -Provides group and verbal instruction on various topics (see comments)   Knowledge Questionnaire Score: Knowledge Questionnaire Score - 02/03/18 1445      Knowledge Questionnaire Score   Pre Score  23/26       Core Components/Risk Factors/Patient Goals at Admission: Personal Goals and Risk Factors at Admission - 02/03/18 1444      Core Components/Risk Factors/Patient Goals on Admission    Weight Management  Yes;Weight Loss    Intervention  Weight Management: Develop a combined nutrition and exercise program designed to reach desired caloric intake, while maintaining appropriate intake of nutrient and fiber, sodium and fats, and appropriate energy expenditure required for the weight goal.;Weight Management: Provide education and appropriate resources to help participant work on and attain dietary goals.;Weight Management/Obesity: Establish reasonable short term and long term weight goals.    Admit Weight  236 lb (107 kg)    Goal Weight: Short Term  232 lb (105.2 kg)    Goal Weight: Long Term  225 lb (102.1 kg)    Expected Outcomes  Short Term: Continue to assess and modify interventions until short term weight is achieved;Long Term: Adherence to nutrition and physical activity/exercise program aimed toward attainment of established weight goal;Weight Loss: Understanding of general recommendations for a balanced deficit meal plan, which promotes 1-2 lb weight loss per week and includes a negative energy balance of [phone] kcal/d;Understanding recommendations for meals to include 15-35% energy as protein, 25-35% energy from fat, 35-60% energy from carbohydrates, less than '200mg'$  of dietary cholesterol, 20-35 gm of total fiber daily;Understanding of distribution of calorie intake throughout the day with the consumption of 4-5  meals/snacks    Hypertension  Yes    Intervention  Provide education on lifestyle modifcations including regular physical activity/exercise, weight management, moderate sodium restriction and increased consumption of fresh fruit, vegetables, and low fat dairy, alcohol moderation, and smoking cessation.;Monitor prescription use compliance.    Expected Outcomes  Short Term: Continued assessment and intervention until BP is < 140/15m HG in hypertensive participants. < 130/850mHG in hypertensive participants with diabetes, heart failure or chronic kidney disease.;Long Term: Maintenance of blood pressure at goal levels.    Lipids  Yes    Intervention  Provide education and support for participant on nutrition & aerobic/resistive exercise along with prescribed medications to achieve LDL '70mg'$ , HDL >'40mg'$ .    Expected Outcomes  Short Term: Participant states understanding of desired cholesterol values and is compliant with medications prescribed. Participant is following exercise prescription and nutrition guidelines.;Long Term: Cholesterol controlled with medications as prescribed, with individualized exercise RX  and with personalized nutrition plan. Value goals: LDL < '70mg'$ , HDL > 40 mg.       Core Components/Risk Factors/Patient Goals Review:  Goals and Risk Factor Review    Row Name 02/17/18 0838 03/20/18 1022 04/17/18 1023 05/12/18 1216       Core Components/Risk Factors/Patient Goals Review   Personal Goals Review  Weight Management/Obesity;Hypertension;Lipids  Weight Management/Obesity;Hypertension;Lipids  Weight Management/Obesity;Hypertension;Lipids  Weight Management/Obesity;Hypertension;Lipids    Review  Richardson Landry is off to a good start in rehab.  He is already starting to lose weight. We talked about how working on his diet and adding home exercise will help with the weight loss too.  He is doing well with his blood pressures in class.  He has not been checking them at home, but will start and keep  a log of it to take to doctors office.   Overall, he feels that his medications are working well for him.    Richardson Landry has continued to weight daily at home.  He is down to 229 lbs today.  He has continued to work on his diet and is trying to stay active at home.  We sent out some education information today and encouraged him to check his email to see what we have been sending as he rarely checks it.  He has not been checking his blood pressure at home, but will try to start that up again.  Overall, he is doing well and staying compliant with his medications.   Richardson Landry has been able to keep his weight steady.  He is would like to continue to lose weight.  He is eating fairly well, but not exercising enough.  We talked about exercising more to help with the weight loss.  His blood pressures are doing well and he continues to check them regularly.    Richardson Landry has been keeping his weight steady.  He feels pretty good overall.     Expected Outcomes  Short: Start to take blood pressures more at home.  Long: Continue to work on weight loss.   Short: Take blood pressures at home.  Long: Continue to work on weight loss.   Short: Exercise more to help with weight loss.  Long: Continue to monitor risk factors.   Short: Continue to try to get more exercise.  Long: Continue to monitor risk factors.        Core Components/Risk Factors/Patient Goals at Discharge (Final Review):  Goals and Risk Factor Review - 05/12/18 1216      Core Components/Risk Factors/Patient Goals Review   Personal Goals Review  Weight Management/Obesity;Hypertension;Lipids    Review  Richardson Landry has been keeping his weight steady.  He feels pretty good overall.     Expected Outcomes  Short: Continue to try to get more exercise.  Long: Continue to monitor risk factors.        ITP Comments: ITP Comments    Row Name 02/03/18 1410 02/26/18 0556 03/18/18 1439 03/26/18 1143 07/03/18 0845   ITP Comments  Med Review completed. Initial ITP created. Diagnosis  can be found in [place] paperwork under media tab  30 day review. Continue with ITP unless directed changes by Medical Director chart review.  Our program is currently closed due to COVID-19.  We are communicating with patient via phone calls and emails.    30 day review. Continue with ITP unless directed changes by Medical Director chart review.  Richardson Landry returned to Cardiac Rehab now that program is open   [place] Name 07/16/18  1339           ITP Comments  30 day review cycle restarting  after being closed since March 16 because of  Covid 19 pandemic. Program opened to patients on July 6. Not all have returned. ITP updated and sent to Medical Director for review,changes as needed and signature          Comments:

## 2018-07-16 NOTE — Progress Notes (Signed)
Daily Session Note  Patient Details  Name: Alex Gomez MRN: [id number] Date of Birth: [birth year] Referring Provider:     Cardiac Rehab from 02/03/2018 in [facility] Cardiac and Pulmonary Rehab  Referring Provider  Diona Browner MD      Encounter Date: 07/16/2018  Check In: Session Check In - 07/16/18 0913      Check-In   Supervising physician immediately available to respond to emergencies  See telemetry face sheet for immediately available ER MD    Location  [facility]-Cardiac & Pulmonary Rehab    Staff Present  Heath Lark, RN, BSN, CCRP;Elverda Wendel [place], MA, RCEP, CCRP, CCET;Joseph Wet [place] [employer] [place]    Virtual Visit  No    Medication changes reported      No    Warm-up and Cool-down  Performed on first and last piece of equipment    Resistance Training Performed  Yes    VAD Patient?  No    PAD/SET Patient?  No      Pain Assessment   Currently in Pain?  No/denies          Social History   Tobacco Use  Smoking Status Former Smoker  . Packs/day: 1.00  . Years: 30.00  . Pack years: 30.00  . Types: Cigarettes  . Start date: 39  . Quit date: 1999  . Years since quitting: 21.5  Smokeless Tobacco Never Used    Goals Met:  Independence with exercise equipment Exercise tolerated well No report of cardiac concerns or symptoms Strength training completed today  Goals Unmet:  Not Applicable  Comments: Pt able to follow exercise prescription today without complaint.  Will continue to monitor for progression.    Dr. Emily Filbert is Medical Director for [place] and [facility].

## 2018-07-17 ENCOUNTER — Encounter: Payer: No Typology Code available for payment source | Admitting: *Deleted

## 2018-07-17 DIAGNOSIS — Z952 Presence of prosthetic heart valve: Secondary | ICD-10-CM

## 2018-07-17 NOTE — Progress Notes (Signed)
Daily Session Note  Patient Details  Name: Alex Gomez MRN: [id number] Date of Birth: 1945/05/09 Referring Provider:     Cardiac Rehab from 02/03/2018 in [facility] Cardiac and Pulmonary Rehab  Referring Provider  Diona Browner MD      Encounter Date: 07/17/2018  Check In: Session Check In - 07/17/18 0839      Check-In   Supervising physician immediately available to respond to emergencies  See telemetry face sheet for immediately available ER MD    Location  [facility]-Cardiac & Pulmonary Rehab    Staff Present  Alberteen Sam, MA, RCEP, CCRP, CCET;Jeanna Durrell BS, Exercise Physiologist;Susanne Bice, RN, BSN, CCRP    Virtual Visit  No    Medication changes reported      No    Fall or balance concerns reported     No    Warm-up and Cool-down  Performed on first and last piece of equipment    Resistance Training Performed  Yes    VAD Patient?  No    PAD/SET Patient?  No      Pain Assessment   Currently in Pain?  No/denies          Social History   Tobacco Use  Smoking Status Former Smoker  . Packs/day: 1.00  . Years: 30.00  . Pack years: 30.00  . Types: Cigarettes  . Start date: 24  . Quit date: 1999  . Years since quitting: 21.5  Smokeless Tobacco Never Used    Goals Met:  Independence with exercise equipment Exercise tolerated well No report of cardiac concerns or symptoms Strength training completed today  Goals Unmet:  Not Applicable  Comments: Pt able to follow exercise prescription today without complaint.  Will continue to monitor for progression.    Dr. Emily Filbert is Medical Director for [place] and [facility].

## 2018-07-21 ENCOUNTER — Other Ambulatory Visit: Payer: Self-pay

## 2018-07-21 ENCOUNTER — Encounter: Payer: No Typology Code available for payment source | Admitting: *Deleted

## 2018-07-21 DIAGNOSIS — Z952 Presence of prosthetic heart valve: Secondary | ICD-10-CM | POA: Diagnosis not present

## 2018-07-21 NOTE — Progress Notes (Signed)
Daily Session Note  Patient Details  Name: Alex Gomez MRN: [id number] Date of Birth: Jul 03, 1945 Referring Provider:     Cardiac Rehab from 02/03/2018 in [facility] Cardiac and Pulmonary Rehab  Referring Provider  Diona Browner MD      Encounter Date: 07/21/2018  Check In: Session Check In - 07/21/18 0858      Check-In   Supervising physician immediately available to respond to emergencies  See telemetry face sheet for immediately available ER MD    Location  [facility]-Cardiac & Pulmonary Rehab    Staff Present  Alberteen Sam, MA, RCEP, CCRP, CCET;Susanne Bice, RN, BSN, [employer], BS, ACSM CEP, Exercise Physiologist    Virtual Visit  No    Medication changes reported      No    Fall or balance concerns reported     No    Warm-up and Cool-down  Performed on first and last piece of equipment    Resistance Training Performed  Yes    VAD Patient?  No    PAD/SET Patient?  No      Pain Assessment   Currently in Pain?  No/denies          Social History   Tobacco Use  Smoking Status Former Smoker  . Packs/day: 1.00  . Years: 30.00  . Pack years: 30.00  . Types: Cigarettes  . Start date: 32  . Quit date: 1999  . Years since quitting: 21.5  Smokeless Tobacco Never Used    Goals Met:  Independence with exercise equipment Exercise tolerated well No report of cardiac concerns or symptoms Strength training completed today  Goals Unmet:  Not Applicable  Comments: Pt able to follow exercise prescription today without complaint.  Will continue to monitor for progression.    Dr. Emily Filbert is Medical Director for [place] and [facility].

## 2018-07-25 ENCOUNTER — Encounter: Payer: Self-pay | Admitting: *Deleted

## 2018-07-30 ENCOUNTER — Other Ambulatory Visit: Payer: Self-pay

## 2018-07-30 ENCOUNTER — Encounter: Payer: No Typology Code available for payment source | Attending: Internal Medicine | Admitting: *Deleted

## 2018-07-30 DIAGNOSIS — Z952 Presence of prosthetic heart valve: Secondary | ICD-10-CM | POA: Insufficient documentation

## 2018-07-30 DIAGNOSIS — Z87891 Personal history of nicotine dependence: Secondary | ICD-10-CM | POA: Diagnosis not present

## 2018-07-30 NOTE — Progress Notes (Signed)
Daily Session Note  Patient Details  Name: Alex Gomez MRN: [id number] Date of Birth: 03/03/1945 Referring Provider:     Cardiac Rehab from 02/03/2018 in [facility]  Referring Provider  Schwartz, Adam MD      Encounter Date: 07/30/2018  Check In: Session Check In - 07/30/18 0853      Check-In   Supervising physician immediately available to respond to emergencies  See telemetry face sheet for immediately available ER MD    Location  [facility]    Staff Present  Jessica Hawkins, MA, RCEP, CCRP, CCET;Joseph Hood RCP,RRT,BSRT;Susanne Bice, RN, BSN, CCRP    Virtual Visit  No    Medication changes reported      No    Fall or balance concerns reported     No    Warm-up and Cool-down  Performed on first and last piece of equipment    Resistance Training Performed  Yes    VAD Patient?  No    PAD/SET Patient?  No      Pain Assessment   Currently in Pain?  No/denies          Social History   Tobacco Use  Smoking Status Former Smoker  . Packs/day: 1.00  . Years: 30.00  . Pack years: 30.00  . Types: Cigarettes  . Start date: 1969  . Quit date: 1999  . Years since quitting: 21.5  Smokeless Tobacco Never Used    Goals Met:  Independence with exercise equipment Exercise tolerated well Personal goals reviewed No report of cardiac concerns or symptoms Strength training completed today  Goals Unmet:  Not Applicable  Comments: Pt able to follow exercise prescription today without complaint.  Will continue to monitor for progression.    Dr. Mark Miller is Medical Director for [facility] and [facility]. 

## 2018-07-31 ENCOUNTER — Encounter: Payer: No Typology Code available for payment source | Admitting: *Deleted

## 2018-07-31 DIAGNOSIS — Z952 Presence of prosthetic heart valve: Secondary | ICD-10-CM | POA: Diagnosis not present

## 2018-07-31 NOTE — Progress Notes (Signed)
Daily Session Note  Patient Details  Name: Alex Gomez MRN: [id number] Date of Birth: 11/22/1945 Referring Provider:     Cardiac Rehab from 02/03/2018 in [facility]  Referring Provider  Schwartz, Adam MD      Encounter Date: 07/31/2018  Check In: Session Check In - 07/31/18 0837      Check-In   Supervising physician immediately available to respond to emergencies  See telemetry face sheet for immediately available ER MD    Location  [facility]    Staff Present   , MA, RCEP, CCRP, CCET;Jeanna Durrell BS, Exercise Physiologist;Susanne Bice, RN, BSN, CCRP;Leslie Castrejon RN, BSN    Virtual Visit  No    Medication changes reported      No    Warm-up and Cool-down  Performed on first and last piece of equipment    Resistance Training Performed  Yes    VAD Patient?  No    PAD/SET Patient?  No      Pain Assessment   Currently in Pain?  No/denies          Social History   Tobacco Use  Smoking Status Former Smoker  . Packs/day: 1.00  . Years: 30.00  . Pack years: 30.00  . Types: Cigarettes  . Start date: 1969  . Quit date: 1999  . Years since quitting: 21.5  Smokeless Tobacco Never Used    Goals Met:  Independence with exercise equipment Exercise tolerated well No report of cardiac concerns or symptoms Strength training completed today  Goals Unmet:  Not Applicable  Comments: Pt able to follow exercise prescription today without complaint.  Will continue to monitor for progression.    Dr. Mark Miller is Medical Director for [facility] and [facility]. 

## 2018-08-04 ENCOUNTER — Encounter: Payer: Medicare Other | Attending: Internal Medicine | Admitting: *Deleted

## 2018-08-04 ENCOUNTER — Other Ambulatory Visit: Payer: Self-pay

## 2018-08-04 DIAGNOSIS — Z952 Presence of prosthetic heart valve: Secondary | ICD-10-CM | POA: Insufficient documentation

## 2018-08-04 DIAGNOSIS — Z87891 Personal history of nicotine dependence: Secondary | ICD-10-CM | POA: Insufficient documentation

## 2018-08-04 NOTE — Progress Notes (Signed)
Daily Session Note  Patient Details  Name: Alex Gomez MRN: [id number] Date of Birth: 04/02/1945 Referring Provider:     Cardiac Rehab from 02/03/2018 in [facility]  Referring Provider  Schwartz, Adam MD      Encounter Date: 08/04/2018  Check In: Session Check In - 08/04/18 0850      Check-In   Supervising physician immediately available to respond to emergencies  See telemetry face sheet for immediately available ER MD    Location  [facility]    Staff Present  Joseph Hood RCP,RRT,BSRT;Susanne Bice, RN, BSN, CCRP;Amanda Sommer, BA, ACSM CEP, Exercise Physiologist    Virtual Visit  No    Medication changes reported      No    Fall or balance concerns reported     No    Warm-up and Cool-down  Performed on first and last piece of equipment    Resistance Training Performed  Yes    VAD Patient?  No    PAD/SET Patient?  No      Pain Assessment   Currently in Pain?  No/denies          Social History   Tobacco Use  Smoking Status Former Smoker  . Packs/day: 1.00  . Years: 30.00  . Pack years: 30.00  . Types: Cigarettes  . Start date: 1969  . Quit date: 1999  . Years since quitting: 21.6  Smokeless Tobacco Never Used    Goals Met:  Independence with exercise equipment Exercise tolerated well No report of cardiac concerns or symptoms Strength training completed today  Goals Unmet:  Not Applicable  Comments: Pt able to follow exercise prescription today without complaint.  Will continue to monitor for progression.    Dr. Mark Miller is Medical Director for [facility] and [facility]. 

## 2018-08-06 ENCOUNTER — Other Ambulatory Visit: Payer: Self-pay

## 2018-08-06 DIAGNOSIS — Z952 Presence of prosthetic heart valve: Secondary | ICD-10-CM

## 2018-08-06 NOTE — Progress Notes (Signed)
Daily Session Note  Patient Details  Name: Alex Gomez MRN: [id number] Date of Birth: 15-Apr-1945 Referring Provider:     Cardiac Rehab from 02/03/2018 in [facility]  Referring Provider  Diona Browner MD      Encounter Date: 08/06/2018  Check In:      Social History   Tobacco Use  Smoking Status Former Smoker  . Packs/day: 1.00  . Years: 30.00  . Pack years: 30.00  . Types: Cigarettes  . Start date: 77  . Quit date: 1999  . Years since quitting: 21.6  Smokeless Tobacco Never Used    Goals Met:  Independence with exercise equipment Exercise tolerated well No report of cardiac concerns or symptoms Strength training completed today  Goals Unmet:  Not Applicable  Comments: Pt able to follow exercise prescription today without complaint.  Will continue to monitor for progression.    Dr. Emily Filbert is Medical Director for [facility] and [facility].

## 2018-08-07 DIAGNOSIS — Z952 Presence of prosthetic heart valve: Secondary | ICD-10-CM

## 2018-08-07 NOTE — Progress Notes (Signed)
Daily Session Note  Patient Details  Name: Alex Gomez MRN: [id number] Date of Birth: Feb 19, 1945 Referring Provider:     Cardiac Rehab from 02/03/2018 in [facility]  Referring Provider  Diona Browner MD      Encounter Date: 08/07/2018  Check In:      Social History   Tobacco Use  Smoking Status Former Smoker  . Packs/day: 1.00  . Years: 30.00  . Pack years: 30.00  . Types: Cigarettes  . Start date: 35  . Quit date: 1999  . Years since quitting: 21.6  Smokeless Tobacco Never Used    Goals Met:  Independence with exercise equipment Exercise tolerated well No report of cardiac concerns or symptoms Strength training completed today  Goals Unmet:  Not Applicable  Comments: Pt able to follow exercise prescription today without complaint.  Will continue to monitor for progression.    Dr. Emily Filbert is Medical Director for [facility].

## 2018-08-11 ENCOUNTER — Encounter: Payer: Medicare Other | Admitting: *Deleted

## 2018-08-11 ENCOUNTER — Other Ambulatory Visit: Payer: Self-pay

## 2018-08-11 DIAGNOSIS — Z952 Presence of prosthetic heart valve: Secondary | ICD-10-CM | POA: Diagnosis not present

## 2018-08-11 NOTE — Progress Notes (Signed)
Daily Session Note  Patient Details  Name: JAVARIS WIGINGTON MRN: [id number] Date of Birth: [birth year] Referring Provider:     Cardiac Rehab from 02/03/2018 in [facility]  Referring Provider  Diona Browner MD      Encounter Date: 08/11/2018  Check In: Session Check In - 08/11/18 0833      Check-In   Supervising physician immediately available to respond to emergencies  See telemetry face sheet for immediately available ER MD    Location  [facility]    Staff Present  Heath Lark, RN, BSN, Laveda Norman, BS, ACSM CEP, Exercise Physiologist;Joseph Tessie Fass RCP,RRT,BSRT    Virtual Visit  No    Medication changes reported      No    Fall or balance concerns reported     No    Warm-up and Cool-down  Performed on first and last piece of equipment    Resistance Training Performed  Yes    VAD Patient?  No    PAD/SET Patient?  No      Pain Assessment   Currently in Pain?  No/denies          Social History   Tobacco Use  Smoking Status Former Smoker  . Packs/day: 1.00  . Years: 30.00  . Pack years: 30.00  . Types: Cigarettes  . Start date: 59  . Quit date: 1999  . Years since quitting: 21.6  Smokeless Tobacco Never Used    Goals Met:  Independence with exercise equipment Exercise tolerated well No report of cardiac concerns or symptoms Strength training completed today  Goals Unmet:  Not Applicable  Comments: Pt able to follow exercise prescription today without complaint.  Will continue to monitor for progression.    Dr. Emily Filbert is Medical Director for [facility] and [facility].

## 2018-08-13 ENCOUNTER — Encounter: Payer: Self-pay | Admitting: *Deleted

## 2018-08-13 ENCOUNTER — Other Ambulatory Visit: Payer: Self-pay

## 2018-08-13 ENCOUNTER — Encounter: Payer: Medicare Other | Admitting: *Deleted

## 2018-08-13 DIAGNOSIS — Z952 Presence of prosthetic heart valve: Secondary | ICD-10-CM | POA: Diagnosis not present

## 2018-08-13 NOTE — Progress Notes (Signed)
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 Cardiac and Pulmonary Rehab  Date  02/17/18  Educator  SB  Instruction Review Code  1- Verbalizes Understanding      Cardiac Medications I: - Group verbal and written instruction to review commonly prescribed medications for heart disease. Reviews the medication, class of the drug, and side effects. Includes the steps to properly store meds and maintain the prescription regimen.   Cardiac Rehab from 03/05/2018 in [facility] Cardiac and Pulmonary Rehab  Date  02/10/18  Educator  SB  Instruction Review Code  1- Verbalizes Understanding      Cardiac Medications II: -Group verbal and written instruction to review commonly prescribed medications for heart disease. Reviews the medication, class of the drug, and side effects. (all other drug classes)   Cardiac Rehab from 03/05/2018 in [facility] Cardiac and Pulmonary Rehab  Date  03/03/18  Educator  SB  Instruction Review Code  1- Verbalizes Understanding       Go Sex-Intimacy & Heart Disease, Get SMART - Goal Setting: - Group verbal and written  instruction through game format to discuss heart disease and the return to  sexual intimacy. Provides group verbal and written material to discuss and apply goal setting through the application of the S.M.A.R.T. Method.   Cardiac Rehab from 03/05/2018 in [facility] Cardiac and Pulmonary Rehab  Date  02/17/18  Educator  SB  Instruction Review Code  1- Verbalizes Understanding      Other Matters of the Heart: - Provides group verbal, written materials and models to describe Stable Angina and Peripheral Artery. Includes description of the disease process and treatment options available to the cardiac patient.   Exercise & Equipment Safety: - Individual verbal instruction and demonstration of equipment use and safety with use of the equipment.   Cardiac Rehab from 03/05/2018 in [facility] Cardiac and Pulmonary Rehab  Date  02/03/18  Educator  [facility]  Instruction Review Code  1- Verbalizes Understanding      Infection Prevention: - Provides verbal and written material to individual with discussion of infection control including proper hand washing and proper equipment cleaning during exercise session.   Cardiac Rehab from 03/05/2018 in [facility] Cardiac and Pulmonary Rehab  Date  02/03/18  Educator  [facility]  Instruction Review Code  1- Verbalizes Understanding      Falls Prevention: - Provides verbal and written material to individual with discussion of falls prevention and safety.   Cardiac Rehab from 03/05/2018 in [facility] Cardiac and Pulmonary Rehab  Date  02/03/18  Educator  [facility]  Instruction Review Code  1- Verbalizes Understanding      Diabetes: - Individual verbal and written instruction to review signs/symptoms of diabetes, desired ranges of glucose level fasting, after meals and with exercise. Acknowledge that pre and post exercise glucose checks will be done for 3 sessions at entry of program.   Know Your Numbers and Risk Factors: -Group verbal and written instruction about important numbers in your health.  Discussion of what are risk factors and how they play a role in the  disease process.  Review of Cholesterol, Blood Pressure, Diabetes, and BMI and the role they play in your overall health.   Cardiac Rehab from 03/05/2018 in [facility] Cardiac and Pulmonary Rehab  Date  03/03/18  Educator  SB  Instruction Review Code  1- Verbalizes Understanding      Sleep Hygiene: -Provides group verbal and written instruction about how sleep can affect your health.  Define sleep hygiene, discuss sleep cycles and impact of sleep habits. Review good sleep hygiene tips.    Other: -Provides group and verbal instruction on various topics (see comments)   Knowledge Questionnaire Score:   Core Components/Risk Factors/Patient Goals at Admission:   Core Components/Risk Factors/Patient Goals Review:  Goals and Risk Factor Review    Row Name 02/17/18 0838 03/20/18 1022 04/17/18 1023 05/12/18 1216 07/30/18 0911     Core Components/Risk Factors/Patient Goals Review   Personal Goals Review  Weight Management/Obesity;Hypertension;Lipids  Weight Management/Obesity;Hypertension;Lipids  Weight Management/Obesity;Hypertension;Lipids  Weight Management/Obesity;Hypertension;Lipids  Weight Management/Obesity;Hypertension;Lipids   Review  Alex Gomez is off to a good start in rehab.  He is already starting to lose weight. We talked about how working on his diet and adding home exercise will help with the weight loss too.  He is doing well with his blood pressures in class.  He has not been checking them at home, but will start and keep a log of it to take to doctors office.   Overall, he feels that his medications are working well for him.  Alex Gomez has continued to weight daily at home.  He is down to 229 lbs today.  He has continued to work on his diet and is trying to stay active at home.  We sent out some education information today and encouraged him to check his email to see what we have been sending as he rarely checks it.  He has not been checking his blood pressure at home, but will try to start  that up again.  Overall, he is doing well and staying compliant with his medications.   Alex Gomez has been able to keep his weight steady.  He is would like to continue to lose weight.  He is eating fairly well, but not exercising enough.  We talked about exercising more to help with the weight loss.  His blood pressures are doing well and he continues to check them regularly.    Alex Gomez has been keeping his weight steady.  He feels pretty good overall.   Alex Gomez has been gaining some weight.  He is going to start walking again to help.  He checks his pressures at home and they have been good.   Expected Outcomes  Short: Start to take blood pressures more at home.  Long: Continue to work on weight loss.   Short: Take blood pressures at home.  Long: Continue to work on weight loss.   Short: Exercise more to help with weight loss.  Long: Continue to monitor risk factors.   Short: Continue to try to get more exercise.  Long: Continue to monitor risk factors.   Short: Continue to try to get more exercise.  Long: Continue to monitor risk factors.       Core Components/Risk Factors/Patient Goals at Discharge (Final Review):  Goals and Risk Factor Review - 07/30/18 0911      Core Components/Risk Factors/Patient Goals Review   Personal Goals Review  Weight Management/Obesity;Hypertension;Lipids    Review  Alex Gomez has been gaining some weight.  He is going to start walking again to help.  He checks his pressures at home and they have been good.    Expected Outcomes  Short: Continue to try to get more exercise.  Long: Continue to monitor risk factors.        ITP Comments: ITP Comments    Row Name 02/26/18 0556 03/18/18 1439 03/26/18 1143 07/03/18 0845 07/16/18 1339   ITP Comments  30 day review. Continue with ITP unless directed changes by Medical Director chart review.  Our program is currently closed due to COVID-19.  We are communicating with patient via phone calls and emails.    30 day review. Continue with ITP  unless directed changes by Medical Director chart review.  Alex Gomez returned to Cardiac Rehab now that program is open  30 day review cycle restarting  after being closed since March 16 because of  Covid 19 pandemic. Program opened to patients on July 6. Not all have returned. ITP updated and sent to Medical Director for review,changes as needed and signature   [place] Name 07/25/18 1209 08/13/18 0611         ITP Comments  [facility] authoization for Cardiac Rehab has expired. Have reached out to [facility] for an Extension. Alex Gomez is aware of this.  30 Day Review Completed today. Continue with ITP unless changed by Medical Director review.         Comments:

## 2018-08-13 NOTE — Progress Notes (Signed)
Daily Session Note  Patient Details  Name: Alex Gomez MRN: [id number] Date of Birth: [birth year] Referring Provider:     Cardiac Rehab from 02/03/2018 in [facility] Cardiac and Pulmonary Rehab  Referring Provider  Diona Browner MD      Encounter Date: 08/13/2018  Check In: Session Check In - 08/13/18 0834      Check-In   Supervising physician immediately available to respond to emergencies  See telemetry face sheet for immediately available ER MD    Location  [facility]-Cardiac & Pulmonary Rehab    Staff Present  Alberteen Sam, MA, RCEP, CCRP, CCET;Joseph Christain [place], RN BSN    Virtual Visit  No    Medication changes reported      No    Fall or balance concerns reported     No    Warm-up and Cool-down  Performed on first and last piece of equipment    Resistance Training Performed  Yes    VAD Patient?  No    PAD/SET Patient?  No      Pain Assessment   Currently in Pain?  No/denies          Social History   Tobacco Use  Smoking Status Former Smoker  . Packs/day: 1.00  . Years: 30.00  . Pack years: 30.00  . Types: Cigarettes  . Start date: 41  . Quit date: 1999  . Years since quitting: 21.6  Smokeless Tobacco Never Used    Goals Met:  Independence with exercise equipment Exercise tolerated well No report of cardiac concerns or symptoms Strength training completed today  Goals Unmet:  Not Applicable  Comments: Pt able to follow exercise prescription today without complaint.  Will continue to monitor for progression.    Dr. Emily Filbert is Medical Director for [place] and [facility].

## 2018-08-14 ENCOUNTER — Encounter: Payer: Medicare Other | Admitting: *Deleted

## 2018-08-14 DIAGNOSIS — Z952 Presence of prosthetic heart valve: Secondary | ICD-10-CM | POA: Diagnosis not present

## 2018-08-14 NOTE — Progress Notes (Signed)
Daily Session Note  Patient Details  Name: JOLAN UPCHURCH MRN: [id number] Date of Birth: September 01, 1945 Referring Provider:     Cardiac Rehab from 02/03/2018 in [facility] Cardiac and Pulmonary Rehab  Referring Provider  Diona Browner MD      Encounter Date: 08/14/2018  Check In: Session Check In - 08/14/18 0918      Check-In   Supervising physician immediately available to respond to emergencies  See telemetry face sheet for immediately available ER MD    Location  [facility]-Cardiac & Pulmonary Rehab    Staff Present  Heath Lark, RN, BSN, Lance Sell, BA, ACSM CEP, Exercise Physiologist;Laureen Owens Shark, BS, RRT, CPFT    Virtual Visit  No    Medication changes reported      No    Fall or balance concerns reported     No    Warm-up and Cool-down  Performed on first and last piece of equipment    Resistance Training Performed  Yes    VAD Patient?  No    PAD/SET Patient?  No      Pain Assessment   Currently in Pain?  No/denies          Social History   Tobacco Use  Smoking Status Former Smoker  . Packs/day: 1.00  . Years: 30.00  . Pack years: 30.00  . Types: Cigarettes  . Start date: 58  . Quit date: 1999  . Years since quitting: 21.6  Smokeless Tobacco Never Used    Goals Met:  Independence with exercise equipment Exercise tolerated well No report of cardiac concerns or symptoms Strength training completed today  Goals Unmet:  Not Applicable  Comments: Pt able to follow exercise prescription today without complaint.  Will continue to monitor for progression.    Dr. Emily Filbert is Medical Director for [place] and [facility].

## 2018-08-18 ENCOUNTER — Other Ambulatory Visit: Payer: Self-pay

## 2018-08-18 ENCOUNTER — Encounter: Payer: Medicare Other | Admitting: *Deleted

## 2018-08-18 DIAGNOSIS — Z952 Presence of prosthetic heart valve: Secondary | ICD-10-CM

## 2018-08-18 NOTE — Progress Notes (Signed)
Daily Session Note  Patient Details  Name: Alex Gomez MRN: [id number] Date of Birth: August 09, 1945 Referring Provider:     Cardiac Rehab from 02/03/2018 in [facility] Cardiac and Pulmonary Rehab  Referring Provider  Diona Browner MD      Encounter Date: 08/18/2018  Check In: Session Check In - 08/18/18 0841      Check-In   Supervising physician immediately available to respond to emergencies  See telemetry face sheet for immediately available ER MD    Location  [facility]-Cardiac & Pulmonary Rehab    Staff Present  Earlean Shawl, BS, ACSM CEP, Exercise Physiologist;Amanda Oletta Darter, BA, ACSM CEP, Exercise Physiologist;Susanne Bice, RN, BSN, CCRP    Virtual Visit  No    Medication changes reported      No    Fall or balance concerns reported     No    Tobacco Cessation  No Change    Warm-up and Cool-down  Performed on first and last piece of equipment    Resistance Training Performed  Yes    VAD Patient?  No    PAD/SET Patient?  No      Pain Assessment   Currently in Pain?  No/denies    Multiple Pain Sites  No          Social History   Tobacco Use  Smoking Status Former Smoker  . Packs/day: 1.00  . Years: 30.00  . Pack years: 30.00  . Types: Cigarettes  . Start date: 45  . Quit date: 1999  . Years since quitting: 21.6  Smokeless Tobacco Never Used    Goals Met:  Independence with exercise equipment Exercise tolerated well No report of cardiac concerns or symptoms Strength training completed today  Goals Unmet:  Not Applicable  Comments: Pt able to follow exercise prescription today without complaint.  Will continue to monitor for progression.    Dr. Emily Filbert is Medical Director for [place] and [facility].

## 2018-08-20 ENCOUNTER — Other Ambulatory Visit: Payer: Self-pay

## 2018-08-20 ENCOUNTER — Encounter: Payer: Medicare Other | Admitting: *Deleted

## 2018-08-20 VITALS — Ht 72.6 in | Wt 235.7 lb

## 2018-08-20 DIAGNOSIS — Z952 Presence of prosthetic heart valve: Secondary | ICD-10-CM

## 2018-08-20 NOTE — Progress Notes (Signed)
Daily Session Note  Patient Details  Name: Alex Gomez MRN: [id number] Date of Birth: 08/19/1945 Referring Provider:     Cardiac Rehab from 02/03/2018 in [facility] Cardiac and Pulmonary Rehab  Referring Provider  Diona Browner MD      Encounter Date: 08/20/2018  Check In: Session Check In - 08/20/18 0905      Check-In   Supervising physician immediately available to respond to emergencies  See telemetry face sheet for immediately available ER MD    Location  [facility]-Cardiac & Pulmonary Rehab    Staff Present  Alberteen Sam, MA, RCEP, CCRP, CCET;Jeanna Durrell BS, Exercise Physiologist;Krista Frederico Hamman, RN BSN    Virtual Visit  No    Medication changes reported      No    Fall or balance concerns reported     No    Warm-up and Cool-down  Performed on first and last piece of equipment    Resistance Training Performed  Yes    VAD Patient?  No    PAD/SET Patient?  No      Pain Assessment   Currently in Pain?  No/denies          Social History   Tobacco Use  Smoking Status Former Smoker  . Packs/day: 1.00  . Years: 30.00  . Pack years: 30.00  . Types: Cigarettes  . Start date: 36  . Quit date: 1999  . Years since quitting: 21.6  Smokeless Tobacco Never Used    Goals Met:  Independence with exercise equipment Exercise tolerated well No report of cardiac concerns or symptoms Strength training completed today  Goals Unmet:  Not Applicable  Comments: Pt able to follow exercise prescription today without complaint.  Will continue to monitor for progression.  [place] Name 08/20/18 0927         6 Minute Walk   Phase  Discharge     Distance  1442 feet     Distance % Change  3 %     Distance Feet Change  42 ft     Walk Time  6 minutes     # of Rest Breaks  0     MPH  2.73     METS  2.64     RPE  13     VO2 Peak  9.25     Symptoms  No     Resting HR  72 bpm     Resting BP  122/64     Resting Oxygen Saturation   95 %     Exercise Oxygen  Saturation  during 6 min walk  95 %     Max Ex. HR  77 bpm     Max Ex. BP  146/80         Dr. Emily Filbert is Medical Director for [place] and [facility].

## 2018-08-25 ENCOUNTER — Encounter: Payer: Medicare Other | Admitting: *Deleted

## 2018-08-25 ENCOUNTER — Other Ambulatory Visit: Payer: Self-pay

## 2018-08-25 DIAGNOSIS — Z952 Presence of prosthetic heart valve: Secondary | ICD-10-CM | POA: Diagnosis not present

## 2018-08-25 NOTE — Progress Notes (Signed)
Daily Session Note  Patient Details  Name: Alex Gomez MRN: [id number] Date of Birth: Jul 26, 1945 Referring Provider:     Cardiac Rehab from 02/03/2018 in [facility] Cardiac and Pulmonary Rehab  Referring Provider  Diona Browner MD      Encounter Date: 08/25/2018  Check In: Session Check In - 08/25/18 0829      Check-In   Supervising physician immediately available to respond to emergencies  See telemetry face sheet for immediately available ER MD    Location  [facility]-Cardiac & Pulmonary Rehab    Staff Present  Heath Lark, RN, BSN, Laveda Norman, BS, ACSM CEP, Exercise Physiologist;Jeanna Durrell BS, Exercise Physiologist    Virtual Visit  No    Medication changes reported      No    Fall or balance concerns reported     No    Warm-up and Cool-down  Performed on first and last piece of equipment    Resistance Training Performed  Yes    VAD Patient?  No    PAD/SET Patient?  No      Pain Assessment   Currently in Pain?  No/denies          Social History   Tobacco Use  Smoking Status Former Smoker  . Packs/day: 1.00  . Years: 30.00  . Pack years: 30.00  . Types: Cigarettes  . Start date: 58  . Quit date: 1999  . Years since quitting: 21.6  Smokeless Tobacco Never Used    Goals Met:  Independence with exercise equipment Exercise tolerated well Personal goals reviewed No report of cardiac concerns or symptoms Strength training completed today  Goals Unmet:  Not Applicable  Comments: Pt able to follow exercise prescription today without complaint.  Will continue to monitor for progression.    Dr. Emily Filbert is Medical Director for [place] and [facility].

## 2018-08-27 ENCOUNTER — Encounter: Payer: Medicare Other | Admitting: *Deleted

## 2018-08-27 ENCOUNTER — Other Ambulatory Visit: Payer: Self-pay

## 2018-08-27 DIAGNOSIS — Z952 Presence of prosthetic heart valve: Secondary | ICD-10-CM | POA: Diagnosis not present

## 2018-08-27 NOTE — Progress Notes (Signed)
Daily Session Note  Patient Details  Name: Alex Gomez MRN: [id number] Date of Birth: 12/07/1945 Referring Provider:     Cardiac Rehab from 02/03/2018 in [facility] Cardiac and Pulmonary Rehab  Referring Provider  Diona Browner MD      Encounter Date: 08/27/2018  Check In: Session Check In - 08/27/18 0833      Check-In   Supervising physician immediately available to respond to emergencies  See telemetry face sheet for immediately available ER MD    Location  [facility]-Cardiac & Pulmonary Rehab    Staff Present  Heath Lark, RN, BSN, CCRP;Jeanna Durrell BS, Exercise Physiologist;Jessica [place], MA, RCEP, CCRP, CCET    Virtual Visit  No    Medication changes reported      No    Fall or balance concerns reported     No    Warm-up and Cool-down  Performed on first and last piece of equipment    Resistance Training Performed  Yes    VAD Patient?  No    PAD/SET Patient?  No      Pain Assessment   Currently in Pain?  No/denies          Social History   Tobacco Use  Smoking Status Former Smoker  . Packs/day: 1.00  . Years: 30.00  . Pack years: 30.00  . Types: Cigarettes  . Start date: 31  . Quit date: 1999  . Years since quitting: 21.6  Smokeless Tobacco Never Used    Goals Met:  Independence with exercise equipment Exercise tolerated well No report of cardiac concerns or symptoms Strength training completed today  Goals Unmet:  Not Applicable  Comments: Pt able to follow exercise prescription today without complaint.  Will continue to monitor for progression.    Dr. Emily Filbert is Medical Director for [place] and [facility].

## 2018-08-28 DIAGNOSIS — Z952 Presence of prosthetic heart valve: Secondary | ICD-10-CM | POA: Diagnosis not present

## 2018-08-28 NOTE — Progress Notes (Signed)
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 physiology of the cardiovascular system and associated critical values. Provides general exercise guidelines with specific guidelines to those with heart or lung disease.    Aerobic Exercise & Resistance Training: - Gives group verbal and written instruction on the various components of exercise. Focuses on aerobic and resistive training programs and the benefits of this training and how to safely progress through these programs..   Flexibility, Balance, Mind/Body Relaxation: Provides group verbal/written instruction on the benefits of flexibility and balance training, including mind/body exercise modes such as yoga, pilates and tai chi.  Demonstration and skill practice provided.   Stress and Anxiety: - Provides group verbal and written instruction about the health risks of elevated stress and causes of high stress.  Discuss the correlation between heart/lung disease and anxiety and treatment options. Review healthy ways to manage with stress and anxiety.   Cardiac Rehab from 03/05/2018 in [facility] Cardiac and Pulmonary  Rehab  Date  02/19/18  Educator  [facility]  Instruction Review Code  1- Verbalizes Understanding      Depression: - Provides group verbal and written instruction on the correlation between heart/lung disease and depressed mood, treatment options, and the stigmas associated with seeking treatment.   Cardiac Rehab from 03/05/2018 in [facility] Cardiac and Pulmonary Rehab  Date  03/05/18  Educator  KG  Instruction Review Code  1- Verbalizes Understanding      Anatomy & Physiology of the Heart: - Group verbal and written instruction and models provide basic cardiac anatomy and physiology, with the coronary electrical and arterial systems. Review of Valvular disease and Heart Failure   Cardiac Procedures: - Group verbal and written instruction to review commonly prescribed medications for heart disease. Reviews the medication, class of the drug, and side effects. Includes the steps to properly store meds and maintain the prescription regimen. (beta blockers and nitrates)   Cardiac Rehab from 03/05/2018 in [facility] Cardiac and Pulmonary Rehab  Date  02/17/18  Educator  SB  Instruction Review Code  1- Verbalizes Understanding      Cardiac Medications I: - Group verbal and written instruction to review commonly prescribed medications for heart disease. Reviews the medication, class of the drug, and side effects. Includes the steps to properly store meds and maintain the prescription regimen.   Cardiac  Rehab from 03/05/2018 in [facility] Cardiac and Pulmonary Rehab  Date  02/10/18  Educator  SB  Instruction Review Code  1- Verbalizes Understanding      Cardiac Medications II: -Group verbal and written instruction to review commonly prescribed medications for heart disease. Reviews the medication, class of the drug, and side effects. (all other drug classes)   Cardiac Rehab from 03/05/2018 in [facility] Cardiac and Pulmonary Rehab  Date  03/03/18  Educator  SB  Instruction Review Code  1- Verbalizes Understanding        Go Sex-Intimacy & Heart Disease, Get SMART - Goal Setting: - Group verbal and written instruction through game format to discuss heart disease and the return to sexual intimacy. Provides group verbal and written material to discuss and apply goal setting through the application of the S.M.A.R.T. Method.   Cardiac Rehab from 03/05/2018 in [facility] Cardiac and Pulmonary Rehab  Date  02/17/18  Educator  SB  Instruction Review Code  1- Verbalizes Understanding      Other Matters of the Heart: - Provides group verbal, written materials and models to describe Stable Angina and Peripheral Artery. Includes description of the disease process and treatment options available to the cardiac patient.   Exercise & Equipment Safety: - Individual verbal instruction and demonstration of equipment use and safety with use of the equipment.   Cardiac Rehab from 03/05/2018 in [facility] Cardiac and Pulmonary Rehab  Date  02/03/18  Educator  [facility]  Instruction Review Code  1- Verbalizes Understanding      Infection Prevention: - Provides verbal and written material to individual with discussion of infection control including proper hand washing and proper equipment cleaning during exercise session.   Cardiac Rehab from 03/05/2018 in [facility] Cardiac and Pulmonary Rehab  Date  02/03/18  Educator  [facility]  Instruction Review Code  1- Verbalizes Understanding      Falls Prevention: - Provides verbal and written material to individual with discussion of falls prevention and safety.   Cardiac Rehab from 03/05/2018 in [facility] Cardiac and Pulmonary Rehab  Date  02/03/18  Educator  [facility]  Instruction Review Code  1- Verbalizes Understanding      Diabetes: - Individual verbal and written instruction to review signs/symptoms of diabetes, desired ranges of glucose level fasting, after meals and with exercise. Acknowledge that pre and post exercise glucose checks will be done for 3 sessions at entry of program.   Know Your Numbers  and Risk Factors: -Group verbal and written instruction about important numbers in your health.  Discussion of what are risk factors and how they play a role in the disease process.  Review of Cholesterol, Blood Pressure, Diabetes, and BMI and the role they play in your overall health.   Cardiac Rehab from 03/05/2018 in [facility] Cardiac and Pulmonary Rehab  Date  03/03/18  Educator  SB  Instruction Review Code  1- Verbalizes Understanding      Sleep Hygiene: -Provides group verbal and written instruction about how sleep can affect your health.  Define sleep hygiene, discuss sleep cycles and impact of sleep habits. Review good sleep hygiene tips.    Other: -Provides group and verbal instruction on various topics (see comments)   Knowledge Questionnaire Score: Knowledge Questionnaire Score - 08/20/18 1255      Knowledge Questionnaire Score   Pre Score  23/26    Post Score  23/26       Core Components/Risk Factors/Patient Goals at Admission:   Core Components/Risk Factors/Patient Goals Review:  Goals and Risk Factor Review    Row Name 03/20/18 1022 04/17/18 1023 05/12/18 1216 07/30/18 0911 08/25/18 0848     Core Components/Risk Factors/Patient Goals Review   Personal Goals Review  Weight Management/Obesity;Hypertension;Lipids  Weight Management/Obesity;Hypertension;Lipids  Weight Management/Obesity;Hypertension;Lipids  Weight Management/Obesity;Hypertension;Lipids  Weight Management/Obesity;Hypertension;Lipids   Review  Alex Gomez has continued to weight daily at home.  He is down to 229 lbs today.  He has continued to work on his diet and is trying to stay active at home.  We sent out some education information today and encouraged him to check his email to see what we have been sending as he rarely checks it.  He has not been checking his blood pressure at home, but will try to start that up again.  Overall, he is doing well and staying compliant with his medications.   Alex Gomez has been able to  keep his weight steady.  He is would like to continue to lose weight.  He is eating fairly well, but not exercising enough.  We talked about exercising more to help with the weight loss.  His blood pressures are doing well and he continues to check them regularly.    Alex Gomez has been keeping his weight steady.  He feels pretty good overall.   Alex Gomez has been gaining some weight.  He is going to start walking again to help.  He checks his pressures at home and they have been good.  Alex Gomez is hoping to continue on his weight loss trend after graduation. He has been staying away from liquor, sweets, and bread to help.  He is planning to continue to walk at home.  His blood pressures are still doing good as well.   Expected Outcomes  Short: Take blood pressures at home.  Long: Continue to work on weight loss.   Short: Exercise more to help with weight loss.  Long: Continue to monitor risk factors.   Short: Continue to try to get more exercise.  Long: Continue to monitor risk factors.   Short: Continue to try to get more exercise.  Long: Continue to monitor risk factors.   Short: Continue to try to get more exercise.  Long: Continue to monitor risk factors.       Core Components/Risk Factors/Patient Goals at Discharge (Final Review):  Goals and Risk Factor Review - 08/25/18 0848      Core Components/Risk Factors/Patient Goals Review   Personal Goals Review  Weight Management/Obesity;Hypertension;Lipids    Review  Alex Gomez is hoping to continue on his weight loss trend after graduation. He has been staying away from liquor, sweets, and bread to help.  He is planning to continue to walk at home.  His blood pressures are still doing good as well.    Expected Outcomes  Short: Continue to try to get more exercise.  Long: Continue to monitor risk factors.        ITP Comments: ITP Comments    Row Name 03/18/18 1439 03/26/18 1143 07/03/18 0845 07/16/18 1339 07/25/18 1209   ITP Comments  Our program is currently closed  due to COVID-19.  We are communicating with patient via phone calls and emails.    30 day review. Continue with ITP unless directed changes by Medical Director chart review.  Alex Gomez returned to Cardiac Rehab now that program is open  30 day review cycle restarting  after being closed since March 16 because of  Covid 19 pandemic. Program opened to patients on July 6. Not all have returned.  ITP updated and sent to Medical Director for review,changes as needed and signature  [place] authoization for Cardiac Rehab has expired. Have reached out to [facility] for an Extension. Alex Gomez is aware of this.   [place] Name 08/13/18 0611           ITP Comments  30 Day Review Completed today. Continue with ITP unless changed by Medical Director review.          Comments: discharge ITP

## 2018-08-28 NOTE — Patient Instructions (Signed)
Discharge Patient Instructions  Patient Details  Name: Alex Gomez MRN: [id number] Date of Birth: [birth year] Referring Provider:  Felipa Eth, MD   Number of Visits: 9  Reason for Discharge:  Patient reached a stable level of exercise. Patient independent in their exercise.  Smoking History:  Social History   Tobacco Use  Smoking Status Former Smoker  . Packs/day: 1.00  . Years: 30.00  . Pack years: 30.00  . Types: Cigarettes  . Start date: 1  . Quit date: 1999  . Years since quitting: 21.6  Smokeless Tobacco Never Used    Diagnosis:  S/P AVR (aortic valve replacement)  Initial Exercise Prescription:   Discharge Exercise Prescription (Final Exercise Prescription Changes): Exercise Prescription Changes - 08/20/18 1200      Response to Exercise   Blood Pressure (Admit)  144/68    Blood Pressure (Exercise)  140/66    Blood Pressure (Exit)  136/70    Heart Rate (Admit)  63 bpm    Heart Rate (Exercise)  104 bpm    Heart Rate (Exit)  80 bpm    Rating of Perceived Exertion (Exercise)  13    Symptoms  none    Duration  Progress to 30 minutes of  aerobic without signs/symptoms of physical distress    Intensity  THRR unchanged      Progression   Progression  Continue to progress workloads to maintain intensity without signs/symptoms of physical distress.    Average METs  2.69      Resistance Training   Training Prescription  Yes    Weight  4 lbs    Reps  10-15      Interval Training   Interval Training  No      Recumbant Bike   Level  5    Minutes  15      NuStep   Level  4    Minutes  15    METs  2.2      REL-XR   Level  3    Minutes  15    METs  2.7      Home Exercise Plan   Plans to continue exercise at  Home (comment)   walking   Frequency  Add 2 additional days to program exercise sessions.    Initial Home Exercises Provided  02/17/18       Functional Capacity: 6 Minute Walk    Row Name 08/20/18 0927         6 Minute Walk    Phase  Discharge     Distance  1442 feet     Distance % Change  3 %     Distance Feet Change  42 ft     Walk Time  6 minutes     # of Rest Breaks  0     MPH  2.73     METS  2.64     RPE  13     VO2 Peak  9.25     Symptoms  No     Resting HR  72 bpm     Resting BP  122/64     Resting Oxygen Saturation   95 %     Exercise Oxygen Saturation  during 6 min walk  95 %     Max Ex. HR  77 bpm     Max Ex. BP  146/80        Quality of Life: Quality of Life - 08/20/18 1256  Quality of Life Scores   Health/Function Pre  29.03 %    Health/Function Post  26 %    Health/Function % Change  -10.44 %    Socioeconomic Pre  28.79 %    Socioeconomic Post  25.29 %    Socioeconomic % Change   -12.16 %    Psych/Spiritual Pre  30 %    Psych/Spiritual Post  29.14 %    Psych/Spiritual % Change  -2.87 %    Family Pre  30 %    Family Post  30 %    Family % Change  0 %    GLOBAL Pre  29.3 %    GLOBAL Post  27 %    GLOBAL % Change  -7.85 %       Personal Goals: Goals established at orientation with interventions provided to work toward goal.    Personal Goals Discharge: Goals and Risk Factor Review - 08/25/18 0848      Core Components/Risk Factors/Patient Goals Review   Personal Goals Review  Weight Management/Obesity;Hypertension;Lipids    Review  Richardson Landry is hoping to continue on his weight loss trend after graduation. He has been staying away from liquor, sweets, and bread to help.  He is planning to continue to walk at home.  His blood pressures are still doing good as well.    Expected Outcomes  Short: Continue to try to get more exercise.  Long: Continue to monitor risk factors.        Exercise Goals and Review:   Exercise Goals Re-Evaluation: Exercise Goals Re-Evaluation    Row Name 03/05/18 1441 03/18/18 1439 03/20/18 1025 04/01/18 1154 04/17/18 1018     Exercise Goal Re-Evaluation   Exercise Goals Review  Increase Physical Activity;Increase Strength and  Stamina;Understanding of Exercise Prescription  Increase Physical Activity;Increase Strength and Stamina;Understanding of Exercise Prescription  Increase Physical Activity;Increase Strength and Stamina;Understanding of Exercise Prescription  Increase Physical Activity;Increase Strength and Stamina;Understanding of Exercise Prescription  Increase Physical Activity;Increase Strength and Stamina;Understanding of Exercise Prescription   Comments  Richardson Landry continues to do well in rehab.  He is on level 5 of the bike and up to 1.9 on the treadmill more consistently.  We will continue to monitor his progress.   Richardson Landry has been doing well in rehab.  He was out sick the previous week.  He was able to return to his workloads.  He will be walking at home.  We will continue to monitor his progress at home.   Richardson Landry has been trying to stay active at home. However, he is not really getting in cardio but rather just activity.  I encouraged him to check his email more frequently and to try the videos that we sent out yesterday.  He said that he would try.  Overall, he is feeling stronger and has more stamina.     Richardson Landry has been doing well at home.  He feel down the stairs last week.  He thinks it was from the numbeness that he gets in his legs on occasion. Since his fall, he has not been out walking but hopes to get back to it.  I encouraged him to use the videos to exercise, especially the chair exercises as he recovers from fall.   Richardson Landry is still struggling with his knee from the fall.  He has not called the doctor but was encouraged to do so.  He will call his PCP to see what they say.  It has kept him from walking  a lot.  He has been staying active by working out in the yard.  He was encouraged to use the videos and do chair exercises again.    Expected Outcomes  Short: Increase treadmill incline.  Long: Continue to increase strength and stamina.   Short: Walk at home   Long: Continue to increase strength and stamina.   Short:  Walk more and try out our videos.  Long: Continue to increase strength and stamina.   Short: Try to use the chair exercise videos.  Long: Continue to rebuild strength and stamina.   Short: Call doctor about knee.  Long: Continue to move more.    [place] Name 05/12/18 1214 07/21/18 1519 07/30/18 0910 08/04/18 1047 08/20/18 1238     Exercise Goal Re-Evaluation   Exercise Goals Review  Increase Physical Activity;Increase Strength and Stamina;Understanding of Exercise Prescription  Increase Physical Activity;Increase Strength and Stamina;Understanding of Exercise Prescription  Increase Physical Activity;Increase Strength and Stamina;Understanding of Exercise Prescription  Increase Physical Activity;Increase Strength and Stamina;Understanding of Exercise Prescription  Increase Physical Activity;Increase Strength and Stamina;Understanding of Exercise Prescription   Comments  Richardson Landry has been doing some exercise. He is trying to walk some.  He continues to get out to work in the yard.   Richardson Landry has been doing well since returning to rehab.  He had one epsiode of a PTSD flare up, but overall he is doing better in the quieter environment in rehab.  He is now up to level 4 on the T4.  We will continue to monitor his progress.  Richardson Landry has been able to return under Medicare.  He has not been walking at home but plans to get back to it.  He is feeling stronger.  Richardson Landry is doing well in rehab.  He is doing well on his equipment, but needs reminders to maintain pace.  We will continue to monitor his progress.  Richardson Landry is nearing graduation.  He improved his 6MWT by 42 ft!  He will continue to exercise by walking at home.   Expected Outcomes  Short: Continue to try to get in more exercise.  Long: Continue to increase activity levels.   Short: Continue to attend regularly.  Long: Continue to increase stamina.  Short: Get back to walking on off days.  Long: Continue to exercise independently.  Short: Attend regularly. Long: Continue to  walk at home.  Short: Graduate  Long: Continue to walk at home.   [place] Name 08/25/18 0848             Exercise Goal Re-Evaluation   Exercise Goals Review  Increase Physical Activity;Increase Strength and Stamina;Understanding of Exercise Prescription       Comments  Richardson Landry is set to graduate on Thursday.  He is going to try to continue to walk each day.  He also has weights to use at home.       Expected Outcomes  Short: Graduate  Long: Continue to walk at home.          Nutrition & Weight - Outcomes:  Post Biometrics - 08/20/18 0937       Post  Biometrics   Height  6' 0.6" (1.844 m)    Weight  235 lb 11.2 oz (106.9 kg)    BMI (Calculated)  31.44    Single Leg Stand  5.32 seconds       Nutrition: Nutrition Therapy & Goals - 06/11/18 1046      Personal Nutrition Goals   Comments  Pt is still  having falls, reports this morning fell in his garden. Pt reports not going to the doctor after fall, expressed concern for hitting head, pt acknowledged but wanted to hold off. Had to cut call short as his friends came over to check on him after his fall this morning. Will call back. Pt thought he was progressing well during in-person rehab and now feels that he is not especially after his multiple falls shortly after.         Nutrition Discharge: Nutrition Assessments - 08/20/18 1146      MEDFICTS Scores   Pre Score  65    Post Score  31    Score Difference  -34       Education Questionnaire Score: Knowledge Questionnaire Score - 08/20/18 1255      Knowledge Questionnaire Score   Pre Score  23/26    Post Score  23/26       Goals reviewed with patient; copy given to patient.

## 2018-08-28 NOTE — Progress Notes (Signed)
Daily Session Note  Patient Details  Name: Alex Gomez MRN: [id number] Date of Birth: August 27, 1945 Referring Provider:     Cardiac Rehab from 02/03/2018 in [facility] Cardiac and Pulmonary Rehab  Referring Provider  Alex Browner MD      Encounter Date: 08/28/2018  Check In:      Social History   Tobacco Use  Smoking Status Former Smoker  . Packs/day: 1.00  . Years: 30.00  . Pack years: 30.00  . Types: Cigarettes  . Start date: 12  . Quit date: 1999  . Years since quitting: 21.6  Smokeless Tobacco Never Used    Goals Met:  Independence with exercise equipment Exercise tolerated well No report of cardiac concerns or symptoms Strength training completed today  Goals Unmet:  Not Applicable  Comments:  Alex Gomez graduated today from  rehab with 36 sessions completed.  Details of the patient's exercise prescription and what He needs to do in order to continue the prescription and progress were discussed with patient.  Patient was given a copy of prescription and goals.  Patient verbalized understanding.  Alex Gomez plans to continue to exercise by walking at home.    Dr. Emily Gomez is Medical Director for [facility].

## 2018-08-28 NOTE — Progress Notes (Signed)
Discharge Progress Report  Patient Details  Name: Alex Gomez MRN: [id number] Date of Birth: [birth year] Referring Provider:     Cardiac Rehab from 02/03/2018 in [facility]  Referring Provider  Diona Browner MD       Number of Visits: 36  Reason for Discharge:  Patient reached a stable level of exercise. Patient independent in their exercise. Patient has met program and personal goals.  Smoking History:  Social History   Tobacco Use  Smoking Status Former Smoker  . Packs/day: 1.00  . Years: 30.00  . Pack years: 30.00  . Types: Cigarettes  . Start date: 104  . Quit date: 1999  . Years since quitting: 21.6  Smokeless Tobacco Never Used    Diagnosis:  S/P AVR (aortic valve replacement)  ADL UCSD:   Initial Exercise Prescription:   Discharge Exercise Prescription (Final Exercise Prescription Changes): Exercise Prescription Changes - 08/20/18 1200      Response to Exercise   Blood Pressure (Admit)  144/68    Blood Pressure (Exercise)  140/66    Blood Pressure (Exit)  136/70    Heart Rate (Admit)  63 bpm    Heart Rate (Exercise)  104 bpm    Heart Rate (Exit)  80 bpm    Rating of Perceived Exertion (Exercise)  13    Symptoms  none    Duration  Progress to 30 minutes of  aerobic without signs/symptoms of physical distress    Intensity  THRR unchanged      Progression   Progression  Continue to progress workloads to maintain intensity without signs/symptoms of physical distress.    Average METs  2.69      Resistance Training   Training Prescription  Yes    Weight  4 lbs    Reps  10-15      Interval Training   Interval Training  No      Recumbant Bike   Level  5    Minutes  15      NuStep   Level  4    Minutes  15    METs  2.2      REL-XR   Level  3    Minutes  15    METs  2.7      Home Exercise Plan   Plans to continue exercise at  Home (comment)   walking   Frequency  Add 2 additional days to program exercise  sessions.    Initial Home Exercises Provided  02/17/18       Functional Capacity: 6 Minute Walk    Row Name 08/20/18 0927         6 Minute Walk   Phase  Discharge     Distance  1442 feet     Distance % Change  3 %     Distance Feet Change  42 ft     Walk Time  6 minutes     # of Rest Breaks  0     MPH  2.73     METS  2.64     RPE  13     VO2 Peak  9.25     Symptoms  No     Resting HR  72 bpm     Resting BP  122/64     Resting Oxygen Saturation   95 %     Exercise Oxygen Saturation  during 6 min walk  95 %     Max Ex. HR  77 bpm     Max Ex. BP  146/80        Psychological, QOL, Others - Outcomes: PHQ 2/9: Depression screen [facility] 2/9 08/20/2018 02/03/2018  Decreased Interest 1 0  Down, Depressed, Hopeless 2 1  PHQ - 2 Score 3 1  Altered sleeping 1 0  Tired, decreased energy 1 1  Change in appetite 1 0  Feeling bad or failure about yourself  2 1  Trouble concentrating 1 0  Moving slowly or fidgety/restless 1 1  Suicidal thoughts 1 0  PHQ-9 Score 11 4  Difficult doing work/chores Somewhat difficult Not difficult at all    Quality of Life: Quality of Life - 08/20/18 1256      Quality of Life Scores   Health/Function Pre  29.03 %    Health/Function Post  26 %    Health/Function % Change  -10.44 %    Socioeconomic Pre  28.79 %    Socioeconomic Post  25.29 %    Socioeconomic % Change   -12.16 %    Psych/Spiritual Pre  30 %    Psych/Spiritual Post  29.14 %    Psych/Spiritual % Change  -2.87 %    Family Pre  30 %    Family Post  30 %    Family % Change  0 %    GLOBAL Pre  29.3 %    GLOBAL Post  27 %    GLOBAL % Change  -7.85 %       Personal Goals: Goals established at orientation with interventions provided to work toward goal.    Personal Goals Discharge: Goals and Risk Factor Review    Row Name 03/20/18 1022 04/17/18 1023 05/12/18 1216 07/30/18 0911 08/25/18 0848     Core Components/Risk Factors/Patient Goals Review   Personal Goals Review  Weight  Management/Obesity;Hypertension;Lipids  Weight Management/Obesity;Hypertension;Lipids  Weight Management/Obesity;Hypertension;Lipids  Weight Management/Obesity;Hypertension;Lipids  Weight Management/Obesity;Hypertension;Lipids   Review  Richardson Landry has continued to weight daily at home.  He is down to 229 lbs today.  He has continued to work on his diet and is trying to stay active at home.  We sent out some education information today and encouraged him to check his email to see what we have been sending as he rarely checks it.  He has not been checking his blood pressure at home, but will try to start that up again.  Overall, he is doing well and staying compliant with his medications.   Richardson Landry has been able to keep his weight steady.  He is would like to continue to lose weight.  He is eating fairly well, but not exercising enough.  We talked about exercising more to help with the weight loss.  His blood pressures are doing well and he continues to check them regularly.    Richardson Landry has been keeping his weight steady.  He feels pretty good overall.   Richardson Landry has been gaining some weight.  He is going to start walking again to help.  He checks his pressures at home and they have been good.  Richardson Landry is hoping to continue on his weight loss trend after graduation. He has been staying away from liquor, sweets, and bread to help.  He is planning to continue to walk at home.  His blood pressures are still doing good as well.   Expected Outcomes  Short: Take blood pressures at home.  Long: Continue to work on weight loss.   Short: Exercise more to help with weight  loss.  Long: Continue to monitor risk factors.   Short: Continue to try to get more exercise.  Long: Continue to monitor risk factors.   Short: Continue to try to get more exercise.  Long: Continue to monitor risk factors.   Short: Continue to try to get more exercise.  Long: Continue to monitor risk factors.       Exercise Goals and Review:   Exercise Goals  Re-Evaluation: Exercise Goals Re-Evaluation    Row Name 03/05/18 1441 03/18/18 1439 03/20/18 1025 04/01/18 1154 04/17/18 1018     Exercise Goal Re-Evaluation   Exercise Goals Review  Increase Physical Activity;Increase Strength and Stamina;Understanding of Exercise Prescription  Increase Physical Activity;Increase Strength and Stamina;Understanding of Exercise Prescription  Increase Physical Activity;Increase Strength and Stamina;Understanding of Exercise Prescription  Increase Physical Activity;Increase Strength and Stamina;Understanding of Exercise Prescription  Increase Physical Activity;Increase Strength and Stamina;Understanding of Exercise Prescription   Comments  Richardson Landry continues to do well in rehab.  He is on level 5 of the bike and up to 1.9 on the treadmill more consistently.  We will continue to monitor his progress.   Richardson Landry has been doing well in rehab.  He was out sick the previous week.  He was able to return to his workloads.  He will be walking at home.  We will continue to monitor his progress at home.   Richardson Landry has been trying to stay active at home. However, he is not really getting in cardio but rather just activity.  I encouraged him to check his email more frequently and to try the videos that we sent out yesterday.  He said that he would try.  Overall, he is feeling stronger and has more stamina.     Richardson Landry has been doing well at home.  He feel down the stairs last week.  He thinks it was from the numbeness that he gets in his legs on occasion. Since his fall, he has not been out walking but hopes to get back to it.  I encouraged him to use the videos to exercise, especially the chair exercises as he recovers from fall.   Richardson Landry is still struggling with his knee from the fall.  He has not called the doctor but was encouraged to do so.  He will call his PCP to see what they say.  It has kept him from walking a lot.  He has been staying active by working out in the yard.  He was encouraged to  use the videos and do chair exercises again.    Expected Outcomes  Short: Increase treadmill incline.  Long: Continue to increase strength and stamina.   Short: Walk at home   Long: Continue to increase strength and stamina.   Short: Walk more and try out our videos.  Long: Continue to increase strength and stamina.   Short: Try to use the chair exercise videos.  Long: Continue to rebuild strength and stamina.   Short: Call doctor about knee.  Long: Continue to move more.    [place] Name 05/12/18 1214 07/21/18 1519 07/30/18 0910 08/04/18 1047 08/20/18 1238     Exercise Goal Re-Evaluation   Exercise Goals Review  Increase Physical Activity;Increase Strength and Stamina;Understanding of Exercise Prescription  Increase Physical Activity;Increase Strength and Stamina;Understanding of Exercise Prescription  Increase Physical Activity;Increase Strength and Stamina;Understanding of Exercise Prescription  Increase Physical Activity;Increase Strength and Stamina;Understanding of Exercise Prescription  Increase Physical Activity;Increase Strength and Stamina;Understanding of Exercise Prescription   Comments  Richardson Landry  has been doing some exercise. He is trying to walk some.  He continues to get out to work in the yard.   Richardson Landry has been doing well since returning to rehab.  He had one epsiode of a PTSD flare up, but overall he is doing better in the quieter environment in rehab.  He is now up to level 4 on the T4.  We will continue to monitor his progress.  Richardson Landry has been able to return under Medicare.  He has not been walking at home but plans to get back to it.  He is feeling stronger.  Richardson Landry is doing well in rehab.  He is doing well on his equipment, but needs reminders to maintain pace.  We will continue to monitor his progress.  Richardson Landry is nearing graduation.  He improved his 6MWT by 42 ft!  He will continue to exercise by walking at home.   Expected Outcomes  Short: Continue to try to get in more exercise.  Long: Continue  to increase activity levels.   Short: Continue to attend regularly.  Long: Continue to increase stamina.  Short: Get back to walking on off days.  Long: Continue to exercise independently.  Short: Attend regularly. Long: Continue to walk at home.  Short: Graduate  Long: Continue to walk at home.   [place] Name 08/25/18 0848             Exercise Goal Re-Evaluation   Exercise Goals Review  Increase Physical Activity;Increase Strength and Stamina;Understanding of Exercise Prescription       Comments  Richardson Landry is set to graduate on Thursday.  He is going to try to continue to walk each day.  He also has weights to use at home.       Expected Outcomes  Short: Graduate  Long: Continue to walk at home.          Nutrition & Weight - Outcomes:  Post Biometrics - 08/20/18 0937       Post  Biometrics   Height  6' 0.6" (1.844 m)    Weight  235 lb 11.2 oz (106.9 kg)    BMI (Calculated)  31.44    Single Leg Stand  5.32 seconds       Nutrition: Nutrition Therapy & Goals - 06/11/18 1046      Personal Nutrition Goals   Comments  Pt is still having falls, reports this morning fell in his garden. Pt reports not going to the doctor after fall, expressed concern for hitting head, pt acknowledged but wanted to hold off. Had to cut call short as his friends came over to check on him after his fall this morning. Will call back. Pt thought he was progressing well during in-person rehab and now feels that he is not especially after his multiple falls shortly after.         Nutrition Discharge: Nutrition Assessments - 08/20/18 1146      MEDFICTS Scores   Pre Score  65    Post Score  31    Score Difference  -34       Education Questionnaire Score: Knowledge Questionnaire Score - 08/20/18 1255      Knowledge Questionnaire Score   Pre Score  23/26    Post Score  23/26       Goals reviewed with patient; copy given to patient.

## 2020-01-29 ENCOUNTER — Emergency Department: Payer: No Typology Code available for payment source

## 2020-01-29 ENCOUNTER — Inpatient Hospital Stay: Payer: No Typology Code available for payment source

## 2020-01-29 ENCOUNTER — Other Ambulatory Visit: Payer: Self-pay

## 2020-01-29 ENCOUNTER — Inpatient Hospital Stay
Admission: EM | Admit: 2020-01-29 | Discharge: 2020-01-30 | DRG: 100 | Disposition: A | Discharge status: Home / Self Care | Payer: No Typology Code available for payment source | Attending: Internal Medicine | Admitting: Internal Medicine

## 2020-01-29 DIAGNOSIS — J690 Pneumonitis due to inhalation of food and vomit: Secondary | ICD-10-CM | POA: Diagnosis present

## 2020-01-29 DIAGNOSIS — Z7289 Other problems related to lifestyle: Secondary | ICD-10-CM | POA: Diagnosis not present

## 2020-01-29 DIAGNOSIS — D7589 Other specified diseases of blood and blood-forming organs: Secondary | ICD-10-CM | POA: Diagnosis present

## 2020-01-29 DIAGNOSIS — C9000 Multiple myeloma not having achieved remission: Secondary | ICD-10-CM | POA: Diagnosis present

## 2020-01-29 DIAGNOSIS — I959 Hypotension, unspecified: Secondary | ICD-10-CM | POA: Diagnosis present

## 2020-01-29 DIAGNOSIS — Z20822 Contact with and (suspected) exposure to covid-19: Secondary | ICD-10-CM | POA: Diagnosis present

## 2020-01-29 DIAGNOSIS — I2510 Atherosclerotic heart disease of native coronary artery without angina pectoris: Secondary | ICD-10-CM | POA: Diagnosis present

## 2020-01-29 DIAGNOSIS — Z953 Presence of xenogenic heart valve: Secondary | ICD-10-CM | POA: Diagnosis not present

## 2020-01-29 DIAGNOSIS — R569 Unspecified convulsions: Secondary | ICD-10-CM | POA: Diagnosis not present

## 2020-01-29 DIAGNOSIS — J9601 Acute respiratory failure with hypoxia: Secondary | ICD-10-CM | POA: Diagnosis present

## 2020-01-29 DIAGNOSIS — Z720 Tobacco use: Secondary | ICD-10-CM | POA: Diagnosis not present

## 2020-01-29 DIAGNOSIS — Z6832 Body mass index (BMI) 32.0-32.9, adult: Secondary | ICD-10-CM | POA: Diagnosis not present

## 2020-01-29 DIAGNOSIS — E669 Obesity, unspecified: Secondary | ICD-10-CM | POA: Diagnosis present

## 2020-01-29 DIAGNOSIS — D539 Nutritional anemia, unspecified: Secondary | ICD-10-CM | POA: Diagnosis present

## 2020-01-29 DIAGNOSIS — E782 Mixed hyperlipidemia: Secondary | ICD-10-CM | POA: Diagnosis present

## 2020-01-29 DIAGNOSIS — F329 Major depressive disorder, single episode, unspecified: Secondary | ICD-10-CM | POA: Diagnosis not present

## 2020-01-29 DIAGNOSIS — Z7982 Long term (current) use of aspirin: Secondary | ICD-10-CM

## 2020-01-29 DIAGNOSIS — I10 Essential (primary) hypertension: Secondary | ICD-10-CM | POA: Diagnosis present

## 2020-01-29 DIAGNOSIS — Z91013 Allergy to seafood: Secondary | ICD-10-CM

## 2020-01-29 DIAGNOSIS — R4182 Altered mental status, unspecified: Secondary | ICD-10-CM | POA: Diagnosis present

## 2020-01-29 DIAGNOSIS — Z79899 Other long term (current) drug therapy: Secondary | ICD-10-CM

## 2020-01-29 DIAGNOSIS — I2583 Coronary atherosclerosis due to lipid rich plaque: Secondary | ICD-10-CM

## 2020-01-29 DIAGNOSIS — I6523 Occlusion and stenosis of bilateral carotid arteries: Secondary | ICD-10-CM | POA: Diagnosis present

## 2020-01-29 DIAGNOSIS — F32A Depression, unspecified: Secondary | ICD-10-CM | POA: Diagnosis present

## 2020-01-29 LAB — CBC WITH DIFFERENTIAL/PLATELET
Abs Immature Granulocytes: 0.04 10*3/uL (ref 0.00–0.07)
Basophils Absolute: 0 10*3/uL (ref 0.0–0.1)
Basophils Relative: 0 %
Eosinophils Absolute: 0.1 10*3/uL (ref 0.0–0.5)
Eosinophils Relative: 2 %
HCT: 30.9 % — ABNORMAL LOW (ref 39.0–52.0)
Hemoglobin: 9.9 g/dL — ABNORMAL LOW (ref 13.0–17.0)
Immature Granulocytes: 1 %
Lymphocytes Relative: 12 %
Lymphs Abs: 0.9 10*3/uL (ref 0.7–4.0)
MCH: 32.1 pg (ref 26.0–34.0)
MCHC: 32 g/dL (ref 30.0–36.0)
MCV: 100.3 fL — ABNORMAL HIGH (ref 80.0–100.0)
Monocytes Absolute: 0.6 10*3/uL (ref 0.1–1.0)
Monocytes Relative: 7 %
Neutro Abs: 6.2 10*3/uL (ref 1.7–7.7)
Neutrophils Relative %: 78 %
Platelets: 184 10*3/uL (ref 150–400)
RBC: 3.08 MIL/uL — ABNORMAL LOW (ref 4.22–5.81)
RDW: 17.2 % — ABNORMAL HIGH (ref 11.5–15.5)
WBC: 7.8 10*3/uL (ref 4.0–10.5)
nRBC: 0 % (ref 0.0–0.2)

## 2020-01-29 LAB — COMPREHENSIVE METABOLIC PANEL
ALT: 23 U/L (ref 0–44)
AST: 24 U/L (ref 15–41)
Albumin: 2.9 g/dL — ABNORMAL LOW (ref 3.5–5.0)
Alkaline Phosphatase: 49 U/L (ref 38–126)
Anion gap: 10 (ref 5–15)
BUN: 26 mg/dL — ABNORMAL HIGH (ref 8–23)
CO2: 23 mmol/L (ref 22–32)
Calcium: 8.3 mg/dL — ABNORMAL LOW (ref 8.9–10.3)
Chloride: 103 mmol/L (ref 98–111)
Creatinine, Ser: 1.05 mg/dL (ref 0.61–1.24)
GFR, Estimated: >60 mL/min (ref >60)
Glucose, Bld: 139 mg/dL — ABNORMAL HIGH (ref 70–99)
Potassium: 4.4 mmol/L (ref 3.5–5.1)
Sodium: 136 mmol/L (ref 135–145)
Total Bilirubin: 0.5 mg/dL (ref 0.3–1.2)
Total Protein: 8.8 g/dL — ABNORMAL HIGH (ref 6.5–8.1)

## 2020-01-29 LAB — URINE DRUG SCREEN, QUALITATIVE (ARMC ONLY)
Amphetamines, Ur Screen: NOT DETECTED
Barbiturates, Ur Screen: NOT DETECTED
Benzodiazepine, Ur Scrn: NOT DETECTED
Cannabinoid 50 Ng, Ur ~~LOC~~: NOT DETECTED
Cocaine Metabolite,Ur ~~LOC~~: NOT DETECTED
MDMA (Ecstasy)Ur Screen: NOT DETECTED
Methadone Scn, Ur: NOT DETECTED
Opiate, Ur Screen: NOT DETECTED
Phencyclidine (PCP) Ur S: NOT DETECTED
Tricyclic, Ur Screen: NOT DETECTED

## 2020-01-29 LAB — URINALYSIS, COMPLETE (UACMP) WITH MICROSCOPIC
Bacteria, UA: NONE SEEN
Bilirubin Urine: NEGATIVE
Glucose, UA: NEGATIVE mg/dL
Hgb urine dipstick: NEGATIVE
Ketones, ur: NEGATIVE mg/dL
Leukocytes,Ua: NEGATIVE
Nitrite: NEGATIVE
Protein, ur: NEGATIVE mg/dL
Specific Gravity, Urine: 1.018 (ref 1.005–1.030)
Squamous Epithelial / HPF: NONE SEEN (ref 0–5)
pH: 5 (ref 5.0–8.0)

## 2020-01-29 LAB — TROPONIN I (HIGH SENSITIVITY)
Troponin I (High Sensitivity): 11 ng/L (ref <18)
Troponin I (High Sensitivity): 14 ng/L (ref <18)

## 2020-01-29 LAB — SARS CORONAVIRUS 2 BY RT PCR (HOSPITAL ORDER, PERFORMED IN ~~LOC~~ HOSPITAL LAB): SARS Coronavirus 2: NEGATIVE

## 2020-01-29 MED ORDER — SODIUM CHLORIDE 0.9 % IV SOLN
3.0000 g | Freq: Four times a day (QID) | INTRAVENOUS | Status: DC
  Administered 2020-01-30 (×3): 3 g via INTRAVENOUS
  Filled 2020-01-29 (×3): qty 8

## 2020-01-29 MED ORDER — SODIUM CHLORIDE 0.9 % IV SOLN
3.0000 g | Freq: Once | INTRAVENOUS | Status: AC
  Administered 2020-01-29: 3 g via INTRAVENOUS
  Filled 2020-01-29: qty 8

## 2020-01-29 MED ORDER — ENOXAPARIN SODIUM 60 MG/0.6ML ~~LOC~~ SOLN
0.5000 mg/kg | SUBCUTANEOUS | Status: DC
  Administered 2020-01-30: 55 mg via SUBCUTANEOUS
  Filled 2020-01-29: qty 0.6

## 2020-01-29 MED ORDER — ENOXAPARIN SODIUM 40 MG/0.4ML ~~LOC~~ SOLN: 40.0000 mg | SUBCUTANEOUS | Status: DC

## 2020-01-29 NOTE — H&P (Signed)
History and Physical    Alex Gomez MWU:[id number] DOB: 05/27/1945 DOA: 01/29/2020  PCP: Pcp, No  Patient coming from: Home, wife at bedside  I have personally briefly reviewed patient's old medical records in [place]  Chief Complaint: Seizure-like activity  HPI: Alex Gomez is a 75 y.o. male with medical history significant for multiple myeloma, hypertension, CAD, aortic valve stenosis s/p bioprosthetic AVR, bilateral carotid stenosis, hyperlipidemia and depression who presents with concerns of seizure-like activity.  Wife at bedside provides most of the history as patient is unable to recall the event.  He was in a building behind their house when a friend went to check on him and found that he was confused.  Wife found him thrashing around and throwing his arms up while sitting in his chair.  His eyes rolled back.  He was incoherent.  Every time she would try to sit him down he would stand up or move to a new chair.  He also bit the left side of his tongue.  She said something similar like this happened several months ago and had mentioned this to his cardiologist and hematologist at the [facility] but never had any neurological work-up.  Patient says that he was having a good day today and does not recall having any confusion.  He denies any fever.  No headache, blurred or double vision.  No chest pain or shortness of breath.  He endorsed tobacco use and drinking at least 1/5 of scotch in a week's time.  Denies any illicit drug use.  No family history of seizures.  No history of any head trauma.  ED Course: He was hypotensive down to 92/66 but this resolved without any intervention.  He was noted to be hypoxic down to 87% and placed on 2 L via nasal cannula.  Chest x-ray shows bilateral opacity in the left lower and left mid lobe consistent with multifocal pneumonia.  Covid PCR test negative. CBC showed no leukocytosis but had anemia with hemoglobin of 9.9.  Previous lab work in our system  from 2 years ago also shows consistent anemia.  BMP otherwise unremarkable  According to ED physician [place] patient appeared to be postictal on his evaluation but did not have any further seizure-like activity.  Patient was completely lucid and alert and oriented during my evaluation.  CT head negative.  ED physician Dr. Cheri Fowler spoke with neurology team at [facility] who recommended further work-up for seizure with MRI brain and EEG. He was started on IV Unasyn for aspiration pneumonia   Review of Systems: Constitutional: No Weight Change, No Fever ENT/Mouth: No sore throat, No Rhinorrhea Eyes: No Eye Pain, No Vision Changes Cardiovascular: No Chest Pain, no SOB,  No Edema, No Palpitations Respiratory: No Cough, No Sputum, No Wheezing, no Dyspnea  Gastrointestinal: No Nausea, No Vomiting, No Diarrhea, No Constipation, No Pain Genitourinary: no Urinary Incontinence, No Urgency, No Flank Pain Musculoskeletal: No Arthralgias, No Myalgias Skin: No Skin Lesions, No Pruritus, Neuro: no Weakness, No Numbness,  No Loss of Consciousness, No Syncope Psych: No Anxiety/Panic, No Depression, no decrease appetite Heme/Lymph: No Bruising, No Bleeding   Surgical Hx Aortic valve repair Back surgery  Social History Currently uses tobacco and drinks alcohol daily  Allergies  Allergen Reactions  . Shellfish Allergy Shortness Of Breath    No family history of seizures  Prior to Admission medications   Medication Sig Start Date End Date Taking? Authorizing Provider  amLODipine (NORVASC) 10 MG tablet Take  by mouth.    [provider]  aspirin EC 81 MG tablet Take by mouth.    [provider]  benzonatate (TESSALON) 200 MG capsule Take 200 mg by mouth at bedtime as needed for cough.    [provider]  carboxymethylcellulose (REFRESH PLUS) 0.5 % SOLN 1 drop 3 (three) times daily as needed.    [provider]  Cholecalciferol 25 MCG (1000 UT) tablet Take  1,000 Units by mouth daily.    [provider]  finasteride (PROSCAR) 5 MG tablet Take by mouth.    [provider]  fluticasone (FLONASE) 50 MCG/ACT nasal spray Place into the nose. 09/30/13   [provider]  furosemide (LASIX) 40 MG tablet Take by mouth.    [provider]  guaiFENesin-codeine 100-10 MG/5ML syrup Take by mouth. 01/20/18   [provider]  oxybutynin (DITROPAN-XL) 5 MG 24 hr tablet Take by mouth.    [provider]  prazosin (MINIPRESS) 5 MG capsule Take by mouth.    [provider]  sennosides-docusate sodium (SENOKOT-S) 8.6-50 MG tablet Take 2 tablets by mouth daily.    [provider]  triamcinolone ointment (KENALOG) 0.1 % Frequency:BID   Dosage:0.0     Instructions:  Note:Dose: 0.1% 12/03/11   [provider]  venlafaxine XR (EFFEXOR-XR) 150 MG 24 hr capsule Take by mouth.    [provider]  zolpidem (AMBIEN CR) 12.5 MG CR tablet Take by mouth.    [provider]    Physical Exam: Vitals:   01/29/20 1535 01/29/20 1558 01/29/20 1559 01/29/20 1845  BP: 92/66   140/79  Pulse: (!) 114   82  Resp: 16   16  SpO2: 97% (!) 87% 90% 100%    Constitutional: NAD, calm, comfortable, obese male laying in bed at 40 degree incline eating a sandwich Vitals:   01/29/20 1535 01/29/20 1558 01/29/20 1559 01/29/20 1845  BP: 92/66   140/79  Pulse: (!) 114   82  Resp: 16   16  SpO2: 97% (!) 87% 90% 100%   Eyes: PERRL, lids and conjunctivae normal ENMT: Mucous membranes are moist. Posterior pharynx clear of any exudate or lesions.small laceration noted to left lateral tongue without any active bleeding Neck: normal, supple Respiratory: clear to auscultation bilaterally, no wheezing, no crackles. Normal respiratory effort on 2 L via nasal cannula. No accessory muscle use.  Cardiovascular: Regular rate and rhythm, no murmurs / rubs / gallops. No extremity edema.   Abdomen: no tenderness,  no masses palpated.  Diastasis recti.  Bowel sounds positive.  Musculoskeletal: no clubbing / cyanosis. No joint deformity upper and lower extremities. Good ROM, no contractures. Normal muscle tone.  Skin: no rashes, lesions, ulcers. No induration Neurologic: CN 2-12 grossly intact. Sensation intact, Strength 5/5 in all 4.  No nystagmus.  Intact finger-to-nose.  Intact heel-to-shin.  Alert and oriented x4 although unable to recall events around his seizure-like activity. Psychiatric: Normal judgment and insight. Alert and oriented x 3. Normal mood.     Labs on Admission: I have personally reviewed following labs and imaging studies  CBC: Recent Labs  Lab 01/29/20 1620  WBC 7.8  NEUTROABS 6.2  HGB 9.9*  HCT 30.9*  MCV 100.3*  PLT 616   Basic Metabolic Panel: Recent Labs  Lab 01/29/20 1620  NA 136  K 4.4  CL 103  CO2 23  GLUCOSE 139*  BUN 26*  CREATININE 1.05  CALCIUM 8.3*   GFR: CrCl cannot  be calculated (Unknown ideal weight.). Liver Function Tests: Recent Labs  Lab 01/29/20 1620  AST 24  ALT 23  ALKPHOS 49  BILITOT 0.5  PROT 8.8*  ALBUMIN 2.9*   No results for input(s): LIPASE, AMYLASE in the last 168 hours. No results for input(s): AMMONIA in the last 168 hours. Coagulation Profile: No results for input(s): INR, PROTIME in the last 168 hours. Cardiac Enzymes: No results for input(s): CKTOTAL, CKMB, CKMBINDEX, TROPONINI in the last 168 hours. BNP (last 3 results) No results for input(s): PROBNP in the last 8760 hours. HbA1C: No results for input(s): HGBA1C in the last 72 hours. CBG: No results for input(s): GLUCAP in the last 168 hours. Lipid Profile: No results for input(s): CHOL, HDL, LDLCALC, TRIG, CHOLHDL, LDLDIRECT in the last 72 hours. Thyroid Function Tests: No results for input(s): TSH, T4TOTAL, FREET4, T3FREE, THYROIDAB in the last 72 hours. Anemia Panel: No results for input(s): VITAMINB12, FOLATE, FERRITIN, TIBC, IRON, RETICCTPCT in the last  72 hours. Urine analysis:    Component Value Date/Time   COLORURINE YELLOW (A) 01/29/2020 1840   APPEARANCEUR CLEAR (A) 01/29/2020 1840   LABSPEC 1.018 01/29/2020 1840   PHURINE 5.0 01/29/2020 1840   GLUCOSEU NEGATIVE 01/29/2020 1840   HGBUR NEGATIVE 01/29/2020 1840   BILIRUBINUR NEGATIVE 01/29/2020 1840   KETONESUR NEGATIVE 01/29/2020 1840   PROTEINUR NEGATIVE 01/29/2020 1840   NITRITE NEGATIVE 01/29/2020 1840   LEUKOCYTESUR NEGATIVE 01/29/2020 1840    Radiological Exams on Admission: DG Chest 1 View  Result Date: 01/29/2020 CLINICAL DATA:  Shortness of breath. EXAM: CHEST  1 VIEW COMPARISON:  None. FINDINGS: There are hazy and coarse airspace opacities bilaterally, most evident in the left lower lung zone and left mid lung zone. The heart size is borderline enlarged. The patient is status post prior median sternotomy and valve replacement. Aortic calcifications are noted. There is no acute osseous abnormality. There are probable small bilateral pleural effusions. IMPRESSION: Hazy and coarse airspace opacities bilaterally, most evident in the left mid lung zone and left lower lung zone, suspicious for multifocal pneumonia (viral or bacterial). Electronically Signed   By: Constance Holster M.D.   On: 01/29/2020 16:24   CT Head Wo Contrast  Result Date: 01/29/2020 CLINICAL DATA:  Altered mental status EXAM: CT HEAD WITHOUT CONTRAST TECHNIQUE: Contiguous axial images were obtained from the base of the skull through the vertex without intravenous contrast. COMPARISON:  None. FINDINGS: Brain: No acute territorial infarction, hemorrhage or intracranial mass. Moderate atrophy. Mild hypodensity in the white matter consistent with chronic small vessel ischemic change. Nonenlarged ventricles Vascular: No hyperdense vessels.  Carotid vascular calcification Skull: Normal. Negative for fracture or focal lesion. Sinuses/Orbits: Moderate mucosal thickening in the maxillary sinuses Other: None IMPRESSION:  1. No CT evidence for acute intracranial abnormality. 2. Atrophy and mild chronic small vessel ischemic changes of the white matter. Electronically Signed   By: Donavan Foil M.D.   On: 01/29/2020 16:11      Assessment/Plan Acute hypoxic respiratory failure secondary to aspiration pneumonia Patient admitted on 2 L via nasal cannula after hypoxia down to 87% Maintain O2 >92%. Wean as tolerated.  Continue IV Unasyn Flutter valve/incentive spirometry  Seizure-like activity No seizure-like activity witnessed in ED. patient alert and oriented x4 during my evaluation.  Suspect metabolic etiology. CT head negative Neurology team from [facility] was consulted by ED physician and recommended pursuing MRI and EEG Will need neurology consult in the morning Placed on seizure precaution Neuro checks q4hrs  Check UDS and ETOH level  Anemia Appears chronic from labs 2 years ago.  Hemoglobin of 9.9. Check iron panel, vitamin B12 and folate  Hypertension Hold antihypertensive as he was hypotensive on admission.  Likely could continue tomorrow.  CAD Continue aspirin  Hyperlipidemia Continue statin   Alcohol use Patient endorsed drinking 1/5 of scotch weekly.  Encourage cessation. No signs of withdrawal  Medication reconciliation was incomplete the time of admission.  Will need to follow-up.  DVT prophylaxis:.Lovenox Code Status: Full Family Communication: Plan discussed with patient and wife at bedside  disposition Plan: Home with at least 2 midnight stays  Consults called:  Admission status: inpatient   Level of care: Progressive Cardiac  Status is: Inpatient  Remains inpatient appropriate because:Inpatient level of care appropriate due to severity of illness   Dispo: The patient is from: Home              Anticipated d/c is to: Home              Anticipated d/c date is: 3 days              Patient currently is not medically stable to d/c.   Difficult to place patient  No         Orene Desanctis DO [facility]   If 7PM-7AM, please contact night-coverage [email or web address]   01/29/2020, 7:54 PM

## 2020-01-29 NOTE — ED Notes (Signed)
Pt in MRI at this time 

## 2020-01-29 NOTE — ED Notes (Signed)
Patient transported to CT 

## 2020-01-29 NOTE — ED Triage Notes (Signed)
Pt presents via EMS with c/o AMS per wife. Per EMS, pt found by wife with AMS. Pt A&Ox4 at this time. Denis memory of what brings him into ED.

## 2020-01-29 NOTE — Progress Notes (Signed)
PHARMACIST - PHYSICIAN COMMUNICATION  CONCERNING:  Enoxaparin (Lovenox) for DVT Prophylaxis    RECOMMENDATION: Patient was prescribed enoxaprin 40mg  q24 hours for VTE prophylaxis.   Filed Weights   01/29/20 2103  Weight: 111.1 kg (245 lb)    Body mass index is 32.32 kg/m.  Estimated Creatinine Clearance: 83.1 mL/min (by C-G formula based on SCr of 1.05 mg/dL).   Based on [place] patient is candidate for enoxaparin 0.5mg /kg TBW SQ every 24 hours based on BMI being >30.  Patient is candidate for enoxaparin 30mg  every 24 hours based on CrCl <11ml/min or Weight <45kg  DESCRIPTION: Pharmacy has adjusted enoxaparin dose per [facility] policy.  Patient is now receiving enoxaparin 0.5 mg/kg every 24 hours   Renda Rolls, PharmD, [facility] 01/29/2020 9:43 PM

## 2020-01-29 NOTE — ED Provider Notes (Signed)
[facility] Emergency Department Provider Note   ____________________________________________   Event Date/Time   First MD Initiated Contact with Patient 01/29/20 1536     (approximate)  I have reviewed the triage vital signs and the nursing notes.   HISTORY  Chief Complaint Altered Mental Status    HPI Alex Gomez is a 75 y.o. male with a stated past medical history of of depression, diverticulosis, hypertension, and CAD who presents for altered mental status via EMS.  Patient was reportedly found by wife after having what she referred to as a "spell".  Patient noted to have dried blood around his mouth and he endorses intraoral trauma biting his tongue.  Patient denies any bowel or bladder incontinence.  Patient's wife at bedside states that he has been having similar episodes that have not lasted as long over the past year but have never been fully worked up for this.  Patient now only endorses mild confusion and some shortness of breath.  Patient currently denies any vision changes, tinnitus, difficulty speaking, facial droop, sore throat, chest pain, shortness of breath, abdominal pain, nausea/vomiting/diarrhea, dysuria, or weakness/numbness/paresthesias in any extremity         No past medical history on file.  Patient Active Problem List   Diagnosis Date Noted  . Aspiration pneumonia ([place]) 01/29/2020  . Depressive disorder 02/03/2018  . Diverticulosis of colon 02/03/2018  . Moderate aortic valve stenosis 04/04/2017  . Benign essential hypertension 08/10/2014  . Bilateral carotid artery stenosis 02/22/2014  . Moderate mitral insufficiency 02/22/2014  . CAD (coronary artery disease) 12/04/2013  . Hyperlipidemia, mixed 12/04/2013    No past surgical history on file.  Prior to Admission medications   Medication Sig Start Date End Date Taking? Authorizing Provider  amLODipine (NORVASC) 10 MG tablet Take by mouth.   Yes [provider]  aspirin EC 81 MG tablet Take by mouth.   Yes [provider]  atorvastatin (LIPITOR) 80 MG tablet Take 80 mg by mouth daily.   Yes [provider]  busPIRone (BUSPAR) 10 MG tablet Take 10 mg by mouth 2 (two) times daily.   Yes [provider]  Cholecalciferol 25 MCG (1000 UT) tablet Take 1,000 Units by mouth daily.   Yes [provider]  finasteride (PROSCAR) 5 MG tablet Take by mouth.   Yes [provider]  fluticasone (FLONASE) 50 MCG/ACT nasal spray Place into the nose. 09/30/13  Yes [provider]  ibuprofen (ADVIL) 600 MG tablet Take 600 mg by mouth every 6 (six) hours as needed.   Yes [provider]  lisinopril (ZESTRIL) 20 MG tablet Take 20 mg by mouth daily.   Yes [provider]  metoprolol tartrate (LOPRESSOR) 25 MG tablet Take 25 mg by mouth 2 (two) times daily.   Yes [provider]  prazosin (MINIPRESS) 5 MG capsule Take by mouth.   Yes [provider]  tamsulosin (FLOMAX) 0.4 MG CAPS capsule Take 0.4 mg by mouth daily. bedtime   Yes [provider]  triamcinolone ointment (KENALOG) 0.1 % Frequency:BID   Dosage:0.0     Instructions:  Note:Dose: 0.1% 12/03/11  Yes [provider]  venlafaxine XR (EFFEXOR-XR) 150 MG 24 hr capsule Take by mouth.   Yes [provider]  zolpidem (AMBIEN CR) 12.5 MG CR tablet Take by mouth.   Yes [provider]  benzonatate (TESSALON) 200 MG capsule Take 200 mg by mouth at bedtime as needed for cough.  [provider]  carboxymethylcellulose (REFRESH PLUS) 0.5 % SOLN 1 drop 3 (three) times daily as needed.    [provider]  furosemide (LASIX) 40 MG tablet Take by mouth.    [provider]  guaiFENesin-codeine 100-10 MG/5ML syrup Take by mouth. 01/20/18   [provider]  oxybutynin (DITROPAN-XL) 5 MG 24 hr tablet Take by mouth.    [provider]  sennosides-docusate sodium  (SENOKOT-S) 8.6-50 MG tablet Take 2 tablets by mouth daily.    [provider]    Allergies Shellfish allergy  No family history on file.  Social History Social History   Tobacco Use  . Smoking status: Former Smoker    Packs/day: 1.00    Years: 30.00    Pack years: 30.00    Types: Cigarettes    Start date: 1969    Quit date: 1999    Years since quitting: 23.0  . Smokeless tobacco: Never Used    Review of Systems Constitutional: No fever/chills Eyes: No visual changes. ENT: No sore throat. Cardiovascular: Denies chest pain. Respiratory: Denies shortness of breath. Gastrointestinal: No abdominal pain.  No nausea, no vomiting.  No diarrhea. Genitourinary: Negative for dysuria. Musculoskeletal: Negative for acute arthralgias Skin: Negative for rash. Neurological: Negative for headaches, weakness/numbness/paresthesias in any extremity Psychiatric: Negative for suicidal ideation/homicidal ideation   ____________________________________________   PHYSICAL EXAM:  VITAL SIGNS: ED Triage Vitals [01/29/20 1535]  Enc Vitals Group     BP 92/66     Pulse Rate (!) 114     Resp 16     Temp      Temp src      SpO2 97 %     Weight      Height      Head Circumference      Peak Flow      Pain Score      Pain Loc      Pain Edu?      Excl. in [place]?    Constitutional: Alert and oriented. Well appearing obese male and in no acute distress. Eyes: Conjunctivae are normal. PERRL. Head: Atraumatic. Nose: No congestion/rhinnorhea. Mouth/Throat: Mucous membranes are moist. Neck: No stridor Cardiovascular: Grossly normal heart sounds.  Good peripheral circulation. Respiratory: Normal respiratory effort.  No retractions. Gastrointestinal: Soft and nontender. No distention. Musculoskeletal: No obvious deformities Neurologic:  Normal speech and language. No gross focal neurologic deficits are appreciated. Skin:  Skin is warm and dry. No rash noted. Psychiatric: Mood and  affect are normal. Speech and behavior are normal.  ____________________________________________   LABS (all labs ordered are listed, but only abnormal results are displayed)  Labs Reviewed  COMPREHENSIVE METABOLIC PANEL - Abnormal; Notable for the following components:      Result Value   Glucose, Bld 139 (*)    BUN 26 (*)    Calcium 8.3 (*)    Total Protein 8.8 (*)    Albumin 2.9 (*)    All other components within normal limits  CBC WITH DIFFERENTIAL/PLATELET - Abnormal; Notable for the following components:   RBC 3.08 (*)    Hemoglobin 9.9 (*)    HCT 30.9 (*)    MCV 100.3 (*)    RDW 17.2 (*)    All other components within normal limits  URINALYSIS, COMPLETE (UACMP) WITH MICROSCOPIC - Abnormal; Notable for the following components:   Color, Urine YELLOW (*)    APPearance CLEAR (*)    All other components within normal limits  SARS CORONAVIRUS 2  BY RT PCR (HOSPITAL ORDER, [address] [facility])  URINE DRUG SCREEN, QUALITATIVE ([facility] ONLY)  HIV ANTIBODY (ROUTINE TESTING W REFLEX)  ETHANOL  IRON AND TIBC  VITAMIN B12  FOLATE  TROPONIN I (HIGH SENSITIVITY)  TROPONIN I (HIGH SENSITIVITY)   ____________________________________________  EKG  ED ECG REPORT I, Naaman Plummer, the attending physician, personally viewed and interpreted this ECG.  Date: 01/29/2020 EKG Time: 1629 Rate: 79 Rhythm: normal sinus rhythm QRS Axis: normal Intervals: normal ST/T Wave abnormalities: normal Narrative Interpretation: no evidence of acute ischemia  ____________________________________________  RADIOLOGY  ED MD interpretation: Single view x-ray of the chest shows hazy and coarse airspace opacities bilaterally.  Given patient's history, concerning for aspiration pneumonia/pneumonitis  CT of the head without contrast shows no evidence of acute abnormalities including no edema, intracranial hemorrhage, or obvious masses.  Does show chronic atrophy and small vessel  disease  Official radiology report(s): DG Chest 1 View  Result Date: 01/29/2020 CLINICAL DATA:  Shortness of breath. EXAM: CHEST  1 VIEW COMPARISON:  None. FINDINGS: There are hazy and coarse airspace opacities bilaterally, most evident in the left lower lung zone and left mid lung zone. The heart size is borderline enlarged. The patient is status post prior median sternotomy and valve replacement. Aortic calcifications are noted. There is no acute osseous abnormality. There are probable small bilateral pleural effusions. IMPRESSION: Hazy and coarse airspace opacities bilaterally, most evident in the left mid lung zone and left lower lung zone, suspicious for multifocal pneumonia (viral or bacterial). Electronically Signed   By: Constance Holster M.D.   On: 01/29/2020 16:24   CT Head Wo Contrast  Result Date: 01/29/2020 CLINICAL DATA:  Altered mental status EXAM: CT HEAD WITHOUT CONTRAST TECHNIQUE: Contiguous axial images were obtained from the base of the skull through the vertex without intravenous contrast. COMPARISON:  None. FINDINGS: Brain: No acute territorial infarction, hemorrhage or intracranial mass. Moderate atrophy. Mild hypodensity in the white matter consistent with chronic small vessel ischemic change. Nonenlarged ventricles Vascular: No hyperdense vessels.  Carotid vascular calcification Skull: Normal. Negative for fracture or focal lesion. Sinuses/Orbits: Moderate mucosal thickening in the maxillary sinuses Other: None IMPRESSION: 1. No CT evidence for acute intracranial abnormality. 2. Atrophy and mild chronic small vessel ischemic changes of the white matter. Electronically Signed   By: Donavan Foil M.D.   On: 01/29/2020 16:11    ____________________________________________   PROCEDURES  Procedure(s) performed (including Critical Care):  .1-3 Lead EKG Interpretation Performed by: Naaman Plummer, MD Authorized by: Naaman Plummer, MD     Interpretation: normal     ECG  rate:  81   ECG rate assessment: normal     Rhythm: sinus rhythm     Ectopy: none     Conduction: normal   .Critical Care Performed by: Naaman Plummer, MD Authorized by: Naaman Plummer, MD   Critical care provider statement:    Critical care time (minutes):  31   Critical care time was exclusive of:  Separately billable procedures and treating other patients   Critical care was necessary to treat or prevent imminent or life-threatening deterioration of the following conditions:  Respiratory failure   Critical care was time spent personally by me on the following activities:  Discussions with consultants, evaluation of patient's response to treatment, examination of patient, ordering and performing treatments and interventions, ordering and review of laboratory studies, ordering and review of radiographic studies, pulse oximetry, re-evaluation of patient's condition, obtaining  history from patient or surrogate and review of old charts   I assumed direction of critical care for this patient from another provider in my specialty: no     Care discussed with: admitting provider       ____________________________________________   INITIAL IMPRESSION / ASSESSMENT AND PLAN / ED COURSE  As part of my medical decision making, I reviewed the following data within the Intercourse notes reviewed and incorporated, Labs reviewed, EKG interpreted, Old chart reviewed, Radiograph reviewed and Notes from prior ED visits reviewed and incorporated     patient presents after recent seizure episode.  Patient had slow return to baseline mental and physical function per EMS. No immunosuppresion hx and had no preceding fever. No history of alcohol abuse or suspicion for toxin ingestion. Unlikely stroke, syncope. Unlikely infectious etiology. No preceding trauma.  Workup: EKG, BMP, POCT glucose (pregnancy test if male) and CT Brain. EKG showing evidence of likely aspiration  pneumonia/pneumonitis.  Empirically treated with Unasyn Patient also requiring 2 L oxygen by nasal cannula to maintain oxygenation Field Interventions: None ED Interventions: None  Disposition: Admit to medicine       ____________________________________________   FINAL CLINICAL IMPRESSION(S) / ED DIAGNOSES  Final diagnoses:  None     ED Discharge Orders    None       Note:  This document was prepared using Dragon voice recognition software and may include unintentional dictation errors.   Naaman Plummer, MD 01/29/20 2020

## 2020-01-29 NOTE — Progress Notes (Signed)
Pharmacy Antibiotic Note  AKONI Gomez is a 75 y.o. male presenting with AMS on 01/29/2020 with aspiration pneumonia.  Pharmacy has been consulted for Unasyn dosing.  Plan: Ordered Unasyn 3gm q6hr per indication, wt, & SCr.  Height: 6\' 1"  (185.4 cm) Weight: 111.1 kg (245 lb) IBW/kg (Calculated) : 79.9  Temp (24hrs), Avg:98.2 F (36.8 C), Min:98.2 F (36.8 C), Max:98.2 F (36.8 C)  Recent Labs  Lab 01/29/20 1620  WBC 7.8  CREATININE 1.05    Estimated Creatinine Clearance: 83.1 mL/min (by C-G formula based on SCr of 1.05 mg/dL).    Allergies  Allergen Reactions  . Shellfish Allergy Shortness Of Breath    Antimicrobials this admission: 01/28 Unasyn >>   Microbiology results: No Lab Cultures ordered at this time.  Thank you for allowing pharmacy to be a part of this patient's care.  Renda Rolls, PharmD, [facility] 01/29/2020 9:54 PM

## 2020-01-30 DIAGNOSIS — I10 Essential (primary) hypertension: Secondary | ICD-10-CM

## 2020-01-30 DIAGNOSIS — J9601 Acute respiratory failure with hypoxia: Secondary | ICD-10-CM | POA: Diagnosis not present

## 2020-01-30 DIAGNOSIS — R569 Unspecified convulsions: Principal | ICD-10-CM

## 2020-01-30 DIAGNOSIS — J690 Pneumonitis due to inhalation of food and vomit: Secondary | ICD-10-CM | POA: Diagnosis not present

## 2020-01-30 DIAGNOSIS — F329 Major depressive disorder, single episode, unspecified: Secondary | ICD-10-CM

## 2020-01-30 LAB — IRON AND TIBC
Iron: 43 ug/dL — ABNORMAL LOW (ref 45–182)
Saturation Ratios: 17 % — ABNORMAL LOW (ref 17.9–39.5)
TIBC: 251 ug/dL (ref 250–450)
UIBC: 208 ug/dL

## 2020-01-30 LAB — HIV ANTIBODY (ROUTINE TESTING W REFLEX): HIV Screen 4th Generation wRfx: NONREACTIVE

## 2020-01-30 LAB — FOLATE: Folate: 12.7 ng/mL (ref >5.9)

## 2020-01-30 LAB — VITAMIN B12: Vitamin B-12: 436 pg/mL (ref 180–914)

## 2020-01-30 MED ORDER — VENLAFAXINE HCL ER 150 MG PO CP24
150.0000 mg | ORAL_CAPSULE | Freq: Every day | ORAL | Status: DC
  Administered 2020-01-30: 150 mg via ORAL
  Filled 2020-01-30 (×2): qty 1

## 2020-01-30 MED ORDER — AMLODIPINE BESYLATE 5 MG PO TABS
10.0000 mg | ORAL_TABLET | Freq: Every day | ORAL | Status: DC
  Administered 2020-01-30: 10 mg via ORAL
  Filled 2020-01-30: qty 2

## 2020-01-30 MED ORDER — LEVETIRACETAM 500 MG PO TABS: 500.0000 mg | ORAL_TABLET | Freq: Two times a day (BID) | ORAL | 0 refills | Status: DC

## 2020-01-30 MED ORDER — ASPIRIN EC 81 MG PO TBEC
81.0000 mg | DELAYED_RELEASE_TABLET | Freq: Every day | ORAL | Status: DC
Start: 2020-01-31 — End: 2020-01-30

## 2020-01-30 MED ORDER — METOPROLOL TARTRATE 25 MG PO TABS
25.0000 mg | ORAL_TABLET | Freq: Two times a day (BID) | ORAL | Status: DC
  Administered 2020-01-30: 25 mg via ORAL
  Filled 2020-01-30: qty 1

## 2020-01-30 MED ORDER — LEVETIRACETAM IN NACL 1000 MG/100ML IV SOLN
1000.0000 mg | Freq: Once | INTRAVENOUS | Status: AC
  Administered 2020-01-30: 1000 mg via INTRAVENOUS
  Filled 2020-01-30: qty 100

## 2020-01-30 MED ORDER — POLYVINYL ALCOHOL 1.4 % OP SOLN
1.0000 [drp] | Freq: Three times a day (TID) | OPHTHALMIC | Status: DC | PRN
  Filled 2020-01-30: qty 15

## 2020-01-30 MED ORDER — BUSPIRONE HCL 5 MG PO TABS
10.0000 mg | ORAL_TABLET | Freq: Two times a day (BID) | ORAL | Status: DC
  Administered 2020-01-30: 10 mg via ORAL
  Filled 2020-01-30: qty 2

## 2020-01-30 MED ORDER — LISINOPRIL 10 MG PO TABS
20.0000 mg | ORAL_TABLET | Freq: Every day | ORAL | Status: DC
  Administered 2020-01-30: 20 mg via ORAL
  Filled 2020-01-30: qty 2

## 2020-01-30 MED ORDER — ATORVASTATIN CALCIUM 80 MG PO TABS
80.0000 mg | ORAL_TABLET | Freq: Every day | ORAL | Status: DC
Start: 2020-01-30 — End: 2020-01-30
  Administered 2020-01-30: 80 mg via ORAL
  Filled 2020-01-30: qty 1
  Filled 2020-01-30: qty 4

## 2020-01-30 MED ORDER — FINASTERIDE 5 MG PO TABS
5.0000 mg | ORAL_TABLET | Freq: Every day | ORAL | Status: DC
  Administered 2020-01-30: 5 mg via ORAL
  Filled 2020-01-30: qty 1

## 2020-01-30 MED ORDER — SENNOSIDES-DOCUSATE SODIUM 8.6-50 MG PO TABS: 2.0000 | ORAL_TABLET | Freq: Every day | ORAL | Status: DC

## 2020-01-30 MED ORDER — TAMSULOSIN HCL 0.4 MG PO CAPS: 0.4000 mg | ORAL_CAPSULE | Freq: Every day | ORAL | Status: DC

## 2020-01-30 MED ORDER — OXYBUTYNIN CHLORIDE ER 5 MG PO TB24
5.0000 mg | ORAL_TABLET | Freq: Every day | ORAL | Status: DC
  Filled 2020-01-30: qty 1

## 2020-01-30 MED ORDER — ZOLPIDEM TARTRATE 5 MG PO TABS: 5.0000 mg | ORAL_TABLET | Freq: Every evening | ORAL | Status: DC | PRN

## 2020-01-30 NOTE — Discharge Instructions (Signed)
Non-Epileptic Seizures, Adult A non-epileptic seizure is an event that can cause abnormal movements or a loss of consciousness. These events differ from epileptic seizures because they are not caused by abnormal electrical and chemical activity in the brain. There are two types of non-epileptic seizures:  Physiologic. This type results from an underlying problem with body function.  Psychogenic. This type results from an underlying mental health disorder. What are the causes? The cause of this condition depends on the kind of non-epileptic seizure that you have. Causes of physiologic non-epileptic seizures  Sudden drop in blood pressure or heart rhythm problems.  Low blood sugar (glucose) or low levels of salt (sodium) in your blood.  Migraine.  Sleep disorders or movement disorders.  Certain medicines.  Heavy use of drugs or alcohol.  Head injuries. Causes of psychogenic non-epileptic seizures  Stress.  Emotional trauma.  Sexual or physical abuse.  Big life events, such as divorce or death of a loved one.  Mental health disorders, including anxiety and depression. What are the signs or symptoms? Symptoms of a non-epileptic seizure can be similar to those of an epileptic seizure. They may include:  A change in attention or behavior.  A loss of consciousness or fainting.  Uncontrollable shaking (convulsions) with fast, jerking movements.  Drooling, tongue biting, or grunting.  Rapid eye movements.  Being unable to control when you urinate or have bowel movements. After a non-epileptic seizure, you may:  Have a headache or sore muscles.  Feel confused or sleepy. Non-epileptic seizures usually:  Do not cause physical injuries.  Start slowly.  Include crying or shrieking.  Last longer than 2 minutes.  Include pelvic thrusting. How is this diagnosed? Non-epileptic seizures may be diagnosed by medical history, physical exam, and symptoms. Your health care  provider may:  Talk with your friends or relatives who have seen you have a seizure.  Ask you to write down your seizure activity and the things that led up to the seizure, and ask you to share that information with him or her. You may also have tests to look for causes of physiologic non-epileptic seizures. Tests may include:  An electroencephalogram (EEG) to check the electrical activity in your brain.  Video EEG in the hospital. This takes 2-7 days.  Blood tests.  An electrocardiogram (ECG) to check for an abnormal heart rhythm.  A CT scan. If your health care provider thinks you have had a psychogenic non-epileptic seizure, you may need to be evaluated by a mental health specialist.   How is this treated? The treatment for your non-epileptic seizures will depend on their cause. When the underlying condition is treated, your non-epileptic seizures should stop. Medicines to treat seizures do not help with non-epileptic events. If your non-epileptic seizures are being caused by emotional trauma or stress, your health care provider may recommend that you see a mental health specialist. Treatment may include:  Relaxation therapy or cognitive behavioral therapy (CBT).  Medicines for depression or anxiety.  Individual or family counseling. Some people have both psychogenic non-epileptic seizures and epileptic seizures. If you have both of these types, you may be prescribed medicine to manage the epileptic seizures. Follow these instructions at home: Home care will depend on the type of non-epileptic seizures that you have. Follow this general guidance: Avoid alcohol and drug use Avoid using any substance that may prevent your medicine from working properly. If you are prescribed medicine for seizures:  Do not use drugs.  Limit or avoid drinking alcohol.  If you drink alcohol: °? Limit how much you have to: °§ 0-1 drink a day for women who are not pregnant. °§ 0-2 drinks a day for  men. °? Know how much alcohol is in your drink. In the [place] one drink equals one 12 oz bottle of beer (355 mL), one 5 oz glass of wine (148 mL), or one 1½ oz glass of hard liquor (44 mL). °Involve family, friends, and coworkers °Make sure family members, friends, and coworkers are trained in how to help you if you have a seizure. If you have a seizure, they should: °· Lay you on the ground to prevent a fall. °· Place a pillow or piece of clothing under your head. °· Loosen any clothing around your neck. °· Turn you onto your side. If you vomit, this position will help keep your windpipe clear. °General instructions °· Follow all instructions from your health care provider. These may include ways to prevent seizures and what to do if you have a seizure. °· Take over-the-counter and prescription medicines only as told by your health care provider. °· Keep all follow-up visits. This is important. °Contact a health care provider if: °· Your non-epileptic seizures change or happen more often. °· Your non-epileptic seizures do not stop after treatment. °Get help right away if: °· You injure yourself during a non-epileptic seizure. °· You have one non-epileptic seizure after another. °· You have trouble recovering from a non-epileptic seizure. °· You have chest pain or trouble breathing. °· You have a non-epileptic seizure that lasts longer than 5 minutes. °These symptoms may represent a serious problem that is an emergency. Do not wait to see if the symptoms will go away. Get medical help right away. Call your local emergency services (911 in the [place] Do not drive yourself to the hospital. °If you ever feel like you may hurt yourself or others, or have thoughts about taking your own life, get help right away. Go to your nearest emergency department or: °· Call your local emergency services (911 in the [place] °· Call a suicide crisis helpline, such as the National Suicide Prevention Lifeline at 1-800-273-8255. This is  open 24 hours a day in the [place] °· Text the Crisis Text Line at 741741 (in the [place] °Summary °· Non-epileptic seizures are events that can cause abnormal movements or a loss of consciousness. These events are caused by an underlying problem with body function or a mental health disorder. °· The treatment for your non-epileptic seizures will depend on their cause. When the underlying condition is treated, your non-epileptic seizures should stop. Medicines for seizures do not treat non-epileptic events. °· People with non-epileptic seizures may also have epileptic seizures and may need medicines to treat seizures. °· Make sure family members, friends, and coworkers are trained in how to help you if you have a non-epileptic seizure. This includes laying you on the ground to prevent a fall, protecting your head and neck, and turning you onto your side. °This information is not intended to replace advice given to you by your health care provider. Make sure you discuss any questions you have with your health care provider. °Document Revised: 06/05/2019 Document Reviewed: 06/05/2019 °Elsevier Patient Education © 2021 [employer] ° °

## 2020-01-30 NOTE — ED Notes (Signed)
Pt given dinner tray. Urinal emptied. Oxygen removed.

## 2020-01-30 NOTE — Discharge Summary (Signed)
DISCHARGE SUMMARY  Alex Gomez  MR#: [id number]  DOB:1945-01-24  Date of Admission: 01/29/2020 Date of Discharge: 01/30/2020  Attending Physician:Kenyatte Gruber Hennie Duos, MD  Patient's PCP:Pcp, No  Consults: telephone consult w/ Neurology   Disposition: D/C home w/ wife    Follow-up Appts:  Follow-up Information    Your Primary Care Doctor at the [facility] Follow up.   Why: Contact you Primary Care Doctor at the [facility] in the next 2-3 days to arrange for a referral to a [place] Neurologist for a Seizure evaluation.               Tests Needing Follow-up: -determine if pt is tolerating his newly initiated Keppra -complete outpt seizure evaluation    Discharge Diagnoses: Suspected Seizure - newly diagnosed  Transient altered mental status - postictal state  Aspiration pneumonitis with transient acute hypoxic respiratory failure Chronic macrocytic anemia HTN CAD AOS status post AVR HLD  Initial presentation: 75 year old [place] [employer] Veteran with a history of MM, HTN, CAD, AVS status post bioprosthetic AVR, bilateral carotid stenosis, HLD, and depression who presented to the ED with seizure-like activity.  Patient was found in his workshop behind the house thrashing around and throwing his arms up in the air while sitting in a chair.  His eyes were rolled back and he was incoherent.  Prior to this he appeared to be in his usual state of health with no acute issues.  In the ED he was found to be modestly hypoxic with saturations 87% on room air.  CXR noted bilateral opacities but Covid testing was negative.  CT head was negative. It was noted he had bitten his tongue. He was initially confused, but quickly returned to his baseline mental status while waiting in the ED.   Hospital Course:  Convulsive activity - rule out seizure CT head w/o acute findings - MRI brain without acute findings - EEG unable to be accomplished over weekend - highly suspicious for seizure given description of  behavior, biting of tongue, transient slowly clearing confusion, and aspiration pneumonitis - discussed w/ on call Neurology who agreed w/ my plan to initiate emperic Keppra w/ outpt Neuro f/u - pt to be loaded w/ 1g IV prior to d/c, then begin 500mg  BID 01/31/20 - f/u with Neurologist at [facility] where he is 100% service connected   Transient altered mental status - postictal state  UDS negative - MRI brain without acute finding - CT head unrevealing - mental status returned to normal during stay in ED   Aspiration pneumonitis with transient acute hypoxic respiratory failure Was dosed w/ abx on presentation, but in f/u there were no persisting signs/sx of active infection - was able to be weaned from O2 support during stay in ED w/ sats 94% or > on RA   Chronic anemia - macrocytosis  Iron studies suggestive of ACD likely related to nutrition - folate normal - B12 normal   HTN BP controlled during this hospital stay   CAD Asymptomatic during this admit   AOS status post AVR Asymptomatic during this admit  HLD No change in medical therapy   Allergies as of 01/30/2020      Reactions   Shellfish Allergy Shortness Of Breath      Medication List    TAKE these medications   amLODipine 10 MG tablet Commonly known as: NORVASC Take by mouth.   aspirin EC 81 MG tablet Take by mouth.   atorvastatin 80 MG tablet Commonly known as: LIPITOR  Take 80 mg by mouth daily.   busPIRone 10 MG tablet Commonly known as: BUSPAR Take 30 mg by mouth 2 (two) times daily.   carboxymethylcellulose 0.5 % Soln Commonly known as: REFRESH PLUS 1 drop 3 (three) times daily as needed.   Cholecalciferol 25 MCG (1000 UT) tablet Take 1,000 Units by mouth daily.   Combivent Respimat 20-100 MCG/ACT Aers respimat Generic drug: Ipratropium-Albuterol Inhale 1 puff into the lungs every 6 (six) hours as needed for wheezing or shortness of breath.   finasteride 5 MG tablet Commonly known as:  PROSCAR Take 5 mg by mouth at bedtime.   fluticasone 50 MCG/ACT nasal spray Commonly known as: FLONASE Place into the nose.   furosemide 40 MG tablet Commonly known as: LASIX Take by mouth.   guaiFENesin-codeine 100-10 MG/5ML syrup Take by mouth.   hydrocortisone 2.5 % cream Apply 1 application topically 2 (two) times daily as needed.   ibuprofen 600 MG tablet Commonly known as: ADVIL Take 600 mg by mouth every 6 (six) hours as needed.   ketoconazole 2 % cream Commonly known as: NIZORAL Apply 1 application topically daily. Apply 1 application to scales on sides of nose   levETIRAcetam 500 MG tablet Commonly known as: Keppra Take 1 tablet (500 mg total) by mouth 2 (two) times daily.   lisinopril 10 MG tablet Commonly known as: ZESTRIL Take 10 mg by mouth daily.   metoprolol tartrate 25 MG tablet Commonly known as: LOPRESSOR Take 25 mg by mouth 2 (two) times daily.   oxybutynin 5 MG 24 hr tablet Commonly known as: DITROPAN-XL Take by mouth.   prazosin 5 MG capsule Commonly known as: MINIPRESS Take 5 mg by mouth. 2.5 mg in the morning and 5 mg at bedtime   sennosides-docusate sodium 8.6-50 MG tablet Commonly known as: SENOKOT-S Take 2 tablets by mouth daily.   tamsulosin 0.4 MG Caps capsule Commonly known as: FLOMAX Take 0.4 mg by mouth daily. bedtime   triamcinolone ointment 0.1 % Commonly known as: KENALOG Frequency:BID   Dosage:0.0     Instructions:  Note:Dose: 0.1%   venlafaxine XR 150 MG 24 hr capsule Commonly known as: EFFEXOR-XR Take 225 mg by mouth daily. Take 225 mg by mouth once daily   zolpidem 10 MG tablet Commonly known as: AMBIEN Take 10 mg by mouth at bedtime.       Day of Discharge BP 122/75   Pulse 69   Temp 98 F (36.7 C) (Oral)   Resp 16   Ht 6\' 1"  (1.854 m)   Wt 111.1 kg   SpO2 95%   BMI 32.32 kg/m   Physical Exam: General: No acute respiratory distress Lungs: Clear to auscultation bilaterally without wheezes or  crackles Cardiovascular: Regular rate and rhythm without murmur gallop or rub normal S1 and S2 Abdomen: Nontender, nondistended, soft, bowel sounds positive, no rebound, no ascites, no appreciable mass Extremities: No significant cyanosis, clubbing, or edema bilateral lower extremities  Basic Metabolic Panel: Recent Labs  Lab 01/29/20 1620  NA 136  K 4.4  CL 103  CO2 23  GLUCOSE 139*  BUN 26*  CREATININE 1.05  CALCIUM 8.3*    Liver Function Tests: Recent Labs  Lab 01/29/20 1620  AST 24  ALT 23  ALKPHOS 49  BILITOT 0.5  PROT 8.8*  ALBUMIN 2.9*    CBC: Recent Labs  Lab 01/29/20 1620  WBC 7.8  NEUTROABS 6.2  HGB 9.9*  HCT 30.9*  MCV 100.3*  PLT 184  Recent Results (from the past 240 hour(s))  SARS Coronavirus 2 by RT PCR (hospital order, performed in [facility] hospital lab) Nasopharyngeal Nasopharyngeal Swab     Status: None   Collection Time: 01/29/20  4:20 PM   Specimen: Nasopharyngeal Swab  Result Value Ref Range Status   SARS Coronavirus 2 NEGATIVE NEGATIVE Final    Comment: (NOTE) SARS-CoV-2 target nucleic acids are NOT DETECTED.  The SARS-CoV-2 RNA is generally detectable in upper and lower respiratory specimens during the acute phase of infection. The lowest concentration of SARS-CoV-2 viral copies this assay can detect is 250 copies / mL. A negative result does not preclude SARS-CoV-2 infection and should not be used as the sole basis for treatment or other patient management decisions.  A negative result may occur with improper specimen collection / handling, submission of specimen other than nasopharyngeal swab, presence of viral mutation(s) within the areas targeted by this assay, and inadequate number of viral copies (<250 copies / mL). A negative result must be combined with clinical observations, patient history, and epidemiological information.  Fact Sheet for Patients:   StrictlyIdeas.no  Fact Sheet for  Healthcare Providers: BankingDealers.co.za  This test is not yet approved or  cleared by the [place] FDA and has been authorized for detection and/or diagnosis of SARS-CoV-2 by FDA under an Emergency Use Authorization (EUA).  This EUA will remain in effect (meaning this test can be used) for the duration of the COVID-19 declaration under Section 564(b)(1) of the Act, 21 U.S.C. section 360bbb-3(b)(1), unless the authorization is terminated or revoked sooner.  Performed at [facility], [address] [place], [place] [address]       Time spent in discharge (includes decision making & examination of pt): 35 minutes  01/30/2020, 5:07 PM   Cherene Altes, MD [facility] Office  [phone]

## 2021-04-15 ENCOUNTER — Encounter: Payer: Self-pay | Admitting: Emergency Medicine

## 2021-04-15 ENCOUNTER — Other Ambulatory Visit: Payer: Self-pay

## 2021-04-15 ENCOUNTER — Emergency Department
Admission: EM | Admit: 2021-04-15 | Discharge: 2021-04-15 | Disposition: A | Discharge status: Home / Self Care | Payer: No Typology Code available for payment source | Attending: Emergency Medicine | Admitting: Emergency Medicine

## 2021-04-15 DIAGNOSIS — Z5321 Procedure and treatment not carried out due to patient leaving prior to being seen by health care provider: Secondary | ICD-10-CM | POA: Insufficient documentation

## 2021-04-15 DIAGNOSIS — M25532 Pain in left wrist: Secondary | ICD-10-CM | POA: Diagnosis not present

## 2021-04-15 NOTE — ED Triage Notes (Addendum)
Reports pain in left wrist that started last night. Denies any injury to site. Seen at [facility] today and xray was done. Xray negative. Was told it was arthritis. States pain was not improving today so he came in. Took tylenol at 10pm and reports no relief.  ?

## 2021-04-15 NOTE — ED Notes (Signed)
Informed by pt's spouse he is going to leave ?

## 2021-06-19 ENCOUNTER — Other Ambulatory Visit: Payer: Self-pay

## 2021-06-19 ENCOUNTER — Emergency Department: Payer: No Typology Code available for payment source

## 2021-06-19 ENCOUNTER — Emergency Department
Admission: EM | Admit: 2021-06-19 | Discharge: 2021-06-19 | Disposition: A | Discharge status: Home / Self Care | Payer: No Typology Code available for payment source | Attending: Emergency Medicine | Admitting: Emergency Medicine

## 2021-06-19 DIAGNOSIS — I10 Essential (primary) hypertension: Secondary | ICD-10-CM | POA: Diagnosis not present

## 2021-06-19 DIAGNOSIS — R4182 Altered mental status, unspecified: Secondary | ICD-10-CM | POA: Diagnosis present

## 2021-06-19 DIAGNOSIS — I2510 Atherosclerotic heart disease of native coronary artery without angina pectoris: Secondary | ICD-10-CM | POA: Insufficient documentation

## 2021-06-19 DIAGNOSIS — R404 Transient alteration of awareness: Secondary | ICD-10-CM | POA: Diagnosis not present

## 2021-06-19 LAB — CBC
HCT: 35.1 % — ABNORMAL LOW (ref 39.0–52.0)
Hemoglobin: 10.8 g/dL — ABNORMAL LOW (ref 13.0–17.0)
MCH: 31.1 pg (ref 26.0–34.0)
MCHC: 30.8 g/dL (ref 30.0–36.0)
MCV: 101.2 fL — ABNORMAL HIGH (ref 80.0–100.0)
Platelets: 157 10*3/uL (ref 150–400)
RBC: 3.47 MIL/uL — ABNORMAL LOW (ref 4.22–5.81)
RDW: 18.4 % — ABNORMAL HIGH (ref 11.5–15.5)
WBC: 6.1 10*3/uL (ref 4.0–10.5)
nRBC: 0 % (ref 0.0–0.2)

## 2021-06-19 LAB — COMPREHENSIVE METABOLIC PANEL
ALT: 35 U/L (ref 0–44)
AST: 23 U/L (ref 15–41)
Albumin: 2.9 g/dL — ABNORMAL LOW (ref 3.5–5.0)
Alkaline Phosphatase: 46 U/L (ref 38–126)
Anion gap: 4 — ABNORMAL LOW (ref 5–15)
BUN: 22 mg/dL (ref 8–23)
CO2: 26 mmol/L (ref 22–32)
Calcium: 8.8 mg/dL — ABNORMAL LOW (ref 8.9–10.3)
Chloride: 108 mmol/L (ref 98–111)
Creatinine, Ser: 0.96 mg/dL (ref 0.61–1.24)
GFR, Estimated: >60 mL/min (ref >60)
Glucose, Bld: 123 mg/dL — ABNORMAL HIGH (ref 70–99)
Potassium: 5 mmol/L (ref 3.5–5.1)
Sodium: 138 mmol/L (ref 135–145)
Total Bilirubin: 0.6 mg/dL (ref 0.3–1.2)
Total Protein: 8.8 g/dL — ABNORMAL HIGH (ref 6.5–8.1)

## 2021-06-19 LAB — PROTIME-INR
INR: 1.1 (ref 0.8–1.2)
Prothrombin Time: 14.3 seconds (ref 11.4–15.2)

## 2021-06-19 NOTE — ED Triage Notes (Signed)
Patient arrived by EMS from rest. With c/o AMS. Patient was unable to recognize who he was with or what happened. Cashier reported he was giving her a blank stare and unable to communicate. HX seizures, stents, PTSD. EMS vitals b/p 114/80, cbg 114, HR 80s

## 2021-06-19 NOTE — ED Triage Notes (Signed)
Pt here wit AMS. Pt states he was told that he was "acting irrational". Pt able to answer questions. Wife at side.

## 2021-06-19 NOTE — ED Notes (Signed)
Pt alert and answering appropriately, ambulating with steady gait, discharge instructions given.

## 2021-06-19 NOTE — ED Provider Notes (Signed)
[facility] Provider Note    Event Date/Time   First MD Initiated Contact with Patient 06/19/21 1546     (approximate)   History   Altered Mental Status   HPI  Alex Gomez is a 76 y.o. male with a history of PTSD, seizure disorder, hypertension, CAD, aortic stenosis, and hyperlipidemia who presents with an episode of altered consciousness today while he was at a store.  Per EMS, a cashier there reported that the patient was giving her a blank stare and unable to communicate.  He was also told that he was acting irrationally.  The patient does not clearly remember the event.  He states that he has a history of PTSD and has periodically had similar episodes in which he becomes confused, is unable to speak, loses his memory, and sometimes has shakes or tremors.  He was diagnosed with a possible seizure with 1 of these episodes and follows with a neurologist.  He is on Keppra and states he is taking it.  He feels that today's episode was identical to his prior ones, and the wife (who was not a witness of today's episode) states that the description she heard is similar to what she has had before.  The patient states that since he has arrived to the ED more than 5 hours ago his symptoms have completely resolved.  He denies any associated vision changes, headache, weakness or numbness, difficulty walking, or other acute symptoms.  He states he feels fine and wants to go home      Physical Exam   Triage Vital Signs: ED Triage Vitals [06/19/21 1131]  Enc Vitals Group     BP 126/69     Pulse Rate 86     Resp 18     Temp 98.4 F (36.9 C)     Temp Source Oral     SpO2 93 %     Weight 240 lb 1.3 oz (108.9 kg)     Height '6\' 1"'$  (1.854 m)     Head Circumference      Peak Flow      Pain Score 0     Pain Loc      Pain Edu?      Excl. in [place]?     Most recent vital signs: Vitals:   06/19/21 1131 06/19/21 1629  BP: 126/69 130/79  Pulse: 86 73  Resp: 18 18  Temp:  98.4 F (36.9 C) (!) 97.5 F (36.4 C)  SpO2: 93% 95%     General: Alert and oriented x4, comfortable appearing. CV:  Good peripheral perfusion.  Resp:  Normal effort.  Abd:  No distention.  Other:  EOMI.  PERRLA.  No facial droop.  Cranial nerves III through XII grossly intact.  Motor and sensory intact in all extremities.  No pronator drift.  No ataxia on finger-to-nose.  Normal gait.   ED Results / Procedures / Treatments   Labs (all labs ordered are listed, but only abnormal results are displayed) Labs Reviewed  COMPREHENSIVE METABOLIC PANEL - Abnormal; Notable for the following components:      Result Value   Glucose, Bld 123 (*)    Calcium 8.8 (*)    Total Protein 8.8 (*)    Albumin 2.9 (*)    Anion gap 4 (*)    All other components within normal limits  CBC - Abnormal; Notable for the following components:   RBC 3.47 (*)    Hemoglobin 10.8 (*)  HCT 35.1 (*)    MCV 101.2 (*)    RDW 18.4 (*)    All other components within normal limits  PROTIME-INR  CBG MONITORING, ED     EKG  ED ECG REPORT I, Arta Silence, the attending physician, personally viewed and interpreted this ECG.  Date: 06/19/2021 EKG Time: 1134 Rate: 78 Rhythm: normal sinus rhythm (incorrectly read by machine as accelerated junctional rhythm, but P waves are present) QRS Axis: normal Intervals: normal ST/T Wave abnormalities: Nonspecific ST abnormalities Narrative Interpretation: Nonspecific abnormalities with no evidence of acute ischemia; no significant change when compared to EKG of 01/29/2020   RADIOLOGY  CT head: I independently viewed and interpreted the images; there is no ICH or evidence of acute stroke.  Radiology report confirms no acute findings.  PROCEDURES:  Critical Care performed: No  Procedures   MEDICATIONS ORDERED IN ED: Medications - No data to display   IMPRESSION / MDM / [place] / ED COURSE  I reviewed the triage vital signs and the nursing  notes.  76 year old male with PMH as noted above presents with a now resolved episode of alteration of consciousness and memory loss, similar to prior episodes he has had that are either related to seizures or PTSD.  On exam the patient is well-appearing and his vital signs are normal.  The physical exam is unremarkable.  The patient is alert and oriented and his neurologic exam is normal.  I reviewed the past medical records.  The patient was last seen and admitted here in January 2022.  Per the hospitalist discharge summary he presented with seizure-like activity although an EEG was not completed during his hospital stay.  He was started on Keppra and has been following at the [facility].  However, during that episode he bit his tongue and had shaking or convulsions.  Differential diagnosis includes, but is not limited to, seizure, PTSD episode, or less likely TIA or transient global amnesia.  Patient's presentation is most consistent with acute presentation with potential threat to life or bodily function.  CT head shows no acute findings.  EKG is also unchanged from prior.  Lab work-up is overall reassuring.  The patient has no electrolyte abnormalities, severe anemia, or other acute findings.  At this time, the patient has been in the ED for over 5 hours, feels well and would like to go home.  I discussed with him and his wife the possible differential diagnosis including the possibility (although the likelihood is low) of TIA.  I advised that I typically would obtain an MRI and then plan for discharge with neuro follow-up if this was normal.  However, the patient states that he has an MRI scheduled in a few days before follow-up with his neurologist and does not want to stay for any additional tests.  Given that his symptoms have completely resolved and given his prior documented history I think that this is reasonable.  I gave the patient very thorough return precautions and he expressed  understanding.  She is stable for discharge home at this time.   FINAL CLINICAL IMPRESSION(S) / ED DIAGNOSES   Final diagnoses:  Transient alteration of awareness     Rx / DC Orders   ED Discharge Orders     None        Note:  This document was prepared using Dragon voice recognition software and may include unintentional dictation errors.    Arta Silence, MD 06/19/21 1635

## 2021-06-19 NOTE — Discharge Instructions (Addendum)
Follow-up with your doctors this week as scheduled.  Return to the ER immediately for new, worsening, or persistent episodes of confusion, memory loss, altered mental status, weakness or numbness, difficulty speaking or understanding language, vision changes, or any other new or worsening symptoms that concern you.

## 2022-05-31 ENCOUNTER — Emergency Department: Payer: Medicare Other

## 2022-05-31 ENCOUNTER — Other Ambulatory Visit: Payer: Self-pay

## 2022-05-31 ENCOUNTER — Inpatient Hospital Stay
Admission: EM | Admit: 2022-05-31 | Discharge: 2022-06-04 | DRG: 871 | Disposition: A | Discharge status: Home Health | Payer: Medicare Other | Attending: Internal Medicine | Admitting: Internal Medicine

## 2022-05-31 DIAGNOSIS — I6523 Occlusion and stenosis of bilateral carotid arteries: Secondary | ICD-10-CM | POA: Diagnosis present

## 2022-05-31 DIAGNOSIS — R6520 Severe sepsis without septic shock: Secondary | ICD-10-CM | POA: Diagnosis present

## 2022-05-31 DIAGNOSIS — G4700 Insomnia, unspecified: Secondary | ICD-10-CM | POA: Diagnosis present

## 2022-05-31 DIAGNOSIS — G252 Other specified forms of tremor: Secondary | ICD-10-CM | POA: Diagnosis present

## 2022-05-31 DIAGNOSIS — F4310 Post-traumatic stress disorder, unspecified: Secondary | ICD-10-CM | POA: Diagnosis present

## 2022-05-31 DIAGNOSIS — R251 Tremor, unspecified: Secondary | ICD-10-CM | POA: Insufficient documentation

## 2022-05-31 DIAGNOSIS — R6510 Systemic inflammatory response syndrome (SIRS) of non-infectious origin without acute organ dysfunction: Secondary | ICD-10-CM

## 2022-05-31 DIAGNOSIS — I2510 Atherosclerotic heart disease of native coronary artery without angina pectoris: Secondary | ICD-10-CM | POA: Diagnosis present

## 2022-05-31 DIAGNOSIS — D649 Anemia, unspecified: Secondary | ICD-10-CM | POA: Insufficient documentation

## 2022-05-31 DIAGNOSIS — R4182 Altered mental status, unspecified: Secondary | ICD-10-CM

## 2022-05-31 DIAGNOSIS — A080 Rotaviral enteritis: Secondary | ICD-10-CM | POA: Insufficient documentation

## 2022-05-31 DIAGNOSIS — E876 Hypokalemia: Secondary | ICD-10-CM | POA: Insufficient documentation

## 2022-05-31 DIAGNOSIS — G9341 Metabolic encephalopathy: Secondary | ICD-10-CM

## 2022-05-31 DIAGNOSIS — D508 Other iron deficiency anemias: Secondary | ICD-10-CM | POA: Diagnosis present

## 2022-05-31 DIAGNOSIS — Z79899 Other long term (current) drug therapy: Secondary | ICD-10-CM

## 2022-05-31 DIAGNOSIS — Z91013 Allergy to seafood: Secondary | ICD-10-CM

## 2022-05-31 DIAGNOSIS — E871 Hypo-osmolality and hyponatremia: Secondary | ICD-10-CM | POA: Insufficient documentation

## 2022-05-31 DIAGNOSIS — A4189 Other specified sepsis: Secondary | ICD-10-CM | POA: Diagnosis not present

## 2022-05-31 DIAGNOSIS — C9000 Multiple myeloma not having achieved remission: Secondary | ICD-10-CM

## 2022-05-31 DIAGNOSIS — J9601 Acute respiratory failure with hypoxia: Secondary | ICD-10-CM | POA: Diagnosis present

## 2022-05-31 DIAGNOSIS — Z1152 Encounter for screening for COVID-19: Secondary | ICD-10-CM

## 2022-05-31 DIAGNOSIS — F32A Depression, unspecified: Secondary | ICD-10-CM | POA: Diagnosis present

## 2022-05-31 DIAGNOSIS — E860 Dehydration: Secondary | ICD-10-CM | POA: Diagnosis present

## 2022-05-31 DIAGNOSIS — I10 Essential (primary) hypertension: Secondary | ICD-10-CM | POA: Diagnosis present

## 2022-05-31 DIAGNOSIS — F0390 Unspecified dementia without behavioral disturbance: Secondary | ICD-10-CM

## 2022-05-31 DIAGNOSIS — K529 Noninfective gastroenteritis and colitis, unspecified: Secondary | ICD-10-CM | POA: Insufficient documentation

## 2022-05-31 DIAGNOSIS — K5900 Constipation, unspecified: Secondary | ICD-10-CM | POA: Diagnosis present

## 2022-05-31 DIAGNOSIS — F03918 Unspecified dementia, unspecified severity, with other behavioral disturbance: Secondary | ICD-10-CM | POA: Diagnosis present

## 2022-05-31 DIAGNOSIS — Z953 Presence of xenogenic heart valve: Secondary | ICD-10-CM

## 2022-05-31 DIAGNOSIS — R0902 Hypoxemia: Secondary | ICD-10-CM

## 2022-05-31 DIAGNOSIS — E669 Obesity, unspecified: Secondary | ICD-10-CM | POA: Insufficient documentation

## 2022-05-31 DIAGNOSIS — D696 Thrombocytopenia, unspecified: Secondary | ICD-10-CM

## 2022-05-31 DIAGNOSIS — D519 Vitamin B12 deficiency anemia, unspecified: Secondary | ICD-10-CM | POA: Insufficient documentation

## 2022-05-31 DIAGNOSIS — K909 Intestinal malabsorption, unspecified: Secondary | ICD-10-CM | POA: Diagnosis present

## 2022-05-31 DIAGNOSIS — D518 Other vitamin B12 deficiency anemias: Secondary | ICD-10-CM | POA: Diagnosis present

## 2022-05-31 DIAGNOSIS — F0393 Unspecified dementia, unspecified severity, with mood disturbance: Secondary | ICD-10-CM | POA: Diagnosis present

## 2022-05-31 DIAGNOSIS — D8481 Immunodeficiency due to conditions classified elsewhere: Secondary | ICD-10-CM | POA: Diagnosis present

## 2022-05-31 DIAGNOSIS — Z87891 Personal history of nicotine dependence: Secondary | ICD-10-CM

## 2022-05-31 DIAGNOSIS — Z7982 Long term (current) use of aspirin: Secondary | ICD-10-CM

## 2022-05-31 DIAGNOSIS — D509 Iron deficiency anemia, unspecified: Secondary | ICD-10-CM | POA: Insufficient documentation

## 2022-05-31 DIAGNOSIS — Z6830 Body mass index (BMI) 30.0-30.9, adult: Secondary | ICD-10-CM

## 2022-05-31 DIAGNOSIS — R296 Repeated falls: Secondary | ICD-10-CM

## 2022-05-31 DIAGNOSIS — N390 Urinary tract infection, site not specified: Secondary | ICD-10-CM | POA: Diagnosis present

## 2022-05-31 DIAGNOSIS — E872 Acidosis, unspecified: Secondary | ICD-10-CM | POA: Insufficient documentation

## 2022-05-31 DIAGNOSIS — E785 Hyperlipidemia, unspecified: Secondary | ICD-10-CM | POA: Diagnosis present

## 2022-05-31 LAB — BASIC METABOLIC PANEL
Anion gap: 10 (ref 5–15)
BUN: 34 mg/dL — ABNORMAL HIGH (ref 8–23)
CO2: 21 mmol/L — ABNORMAL LOW (ref 22–32)
Calcium: 8.1 mg/dL — ABNORMAL LOW (ref 8.9–10.3)
Chloride: 100 mmol/L (ref 98–111)
Creatinine, Ser: 1.16 mg/dL (ref 0.61–1.24)
GFR, Estimated: >60 mL/min (ref >60)
Glucose, Bld: 125 mg/dL — ABNORMAL HIGH (ref 70–99)
Potassium: 3.8 mmol/L (ref 3.5–5.1)
Sodium: 131 mmol/L — ABNORMAL LOW (ref 135–145)

## 2022-05-31 LAB — CBC
HCT: 36.7 % — ABNORMAL LOW (ref 39.0–52.0)
Hemoglobin: 10.9 g/dL — ABNORMAL LOW (ref 13.0–17.0)
MCH: 25 pg — ABNORMAL LOW (ref 26.0–34.0)
MCHC: 29.7 g/dL — ABNORMAL LOW (ref 30.0–36.0)
MCV: 84.2 fL (ref 80.0–100.0)
Platelets: 92 10*3/uL — ABNORMAL LOW (ref 150–400)
RBC: 4.36 MIL/uL (ref 4.22–5.81)
RDW: 20.2 % — ABNORMAL HIGH (ref 11.5–15.5)
WBC: 9.8 10*3/uL (ref 4.0–10.5)
nRBC: 0 % (ref 0.0–0.2)

## 2022-05-31 LAB — URINALYSIS, ROUTINE W REFLEX MICROSCOPIC
Bacteria, UA: NONE SEEN
Bilirubin Urine: NEGATIVE
Glucose, UA: NEGATIVE mg/dL
Hgb urine dipstick: NEGATIVE
Ketones, ur: NEGATIVE mg/dL
Nitrite: NEGATIVE
Protein, ur: NEGATIVE mg/dL
Specific Gravity, Urine: 1.019 (ref 1.005–1.030)
pH: 5 (ref 5.0–8.0)

## 2022-05-31 LAB — CBG MONITORING, ED
Glucose-Capillary: 88 mg/dL (ref 70–99)
Glucose-Capillary: 106 mg/dL — ABNORMAL HIGH (ref 70–99)

## 2022-05-31 LAB — SARS CORONAVIRUS 2 BY RT PCR: SARS Coronavirus 2 by RT PCR: NEGATIVE

## 2022-05-31 LAB — TROPONIN I (HIGH SENSITIVITY): Troponin I (High Sensitivity): 12 ng/L (ref <18)

## 2022-05-31 LAB — BRAIN NATRIURETIC PEPTIDE: B Natriuretic Peptide: 108.6 pg/mL — ABNORMAL HIGH (ref 0.0–100.0)

## 2022-05-31 MED ORDER — IOHEXOL 350 MG/ML SOLN
100.0000 mL | Freq: Once | INTRAVENOUS | Status: AC | PRN
  Administered 2022-05-31: 100 mL via INTRAVENOUS

## 2022-05-31 NOTE — ED Notes (Signed)
Pt has been having tremors and started on primidone 50mg  yesterday prior to the falls. Wife states tremors were worse after taking the medication. Pt did not take the the medication today.

## 2022-05-31 NOTE — ED Triage Notes (Signed)
Pt wife states pt has been unsteady on feet fell twice yesterday denies any injury and was able to assist pt back up into a chair. No LOC. Pt states he has been weak and constipated. Slight pain in left shoulder. Pt sweating in triage and stating he is nervous. Wife states last night between 1230 and 6 am pt was very agitation and was back and forth to the bathroom non stop.

## 2022-05-31 NOTE — ED Triage Notes (Signed)
Arrives via [facility] from home. C/O trembling more than usual.  Fall yesterday.  Housekeepers found patient today had been incontinent of urine and stool.  VS wnl.  PMH:  TIA, HTN

## 2022-05-31 NOTE — ED Provider Notes (Signed)
[facility] Provider Note    Event Date/Time   First MD Initiated Contact with Patient 05/31/22 1735     (approximate)   History   Weakness, Fall, and Constipation   HPI  Alex Gomez is a 77 y.o. male  who presents to the emergency department today because of concern for falls and confusion.  Patient himself is unable to give any significant history.  Does have history of dementia per wife.  Most history is obtained from wife.  She states that the patient has been going downhill for a number of months.  Sounds like today he might have been slightly more confused than he has at baseline although he does have confusion at baseline.  He had 2 falls today.  He falls frequently.  No reported fevers or other signs of illness at home.     Physical Exam   Triage Vital Signs: ED Triage Vitals  Enc Vitals Group     BP 05/31/22 1537 (!) 159/66     Pulse Rate 05/31/22 1537 80     Resp 05/31/22 1537 20     Temp 05/31/22 1537 98.4 F (36.9 C)     Temp Source 05/31/22 1537 Oral     SpO2 05/31/22 1537 98 %     Weight 05/31/22 1538 230 lb (104.3 kg)     Height --      Head Circumference --      Peak Flow --      Pain Score 05/31/22 1538 0     Pain Loc --      Pain Edu? --      Excl. in [place]? --     Most recent vital signs: Vitals:   05/31/22 1537  BP: (!) 159/66  Pulse: 80  Resp: 20  Temp: 98.4 F (36.9 C)  SpO2: 98%   General: Awake, alert, not oriented. CV:  Good peripheral perfusion. Regular rate and rhythm. Resp:  Normal effort. Lungs clear. Abd:  No distention.  Neuro:  Moving all extremities. Confusion.   ED Results / Procedures / Treatments   Labs (all labs ordered are listed, but only abnormal results are displayed) Labs Reviewed  BASIC METABOLIC PANEL - Abnormal; Notable for the following components:      Result Value   Sodium 131 (*)    CO2 21 (*)    Glucose, Bld 125 (*)    BUN 34 (*)    Calcium 8.1 (*)    All other components  within normal limits  CBC - Abnormal; Notable for the following components:   Hemoglobin 10.9 (*)    HCT 36.7 (*)    MCH 25.0 (*)    MCHC 29.7 (*)    RDW 20.2 (*)    Platelets 92 (*)    All other components within normal limits  URINALYSIS, ROUTINE W REFLEX MICROSCOPIC  CBG MONITORING, ED     EKG  I, Phineas Semen, attending physician, personally viewed and interpreted this EKG  EKG Time: 1537 Rate: 112 Rhythm: sinus tachycardia Axis: left axis deviation Intervals: qtc 592 QRS: narrow ST changes: no st elevation Impression: abnormal ekg   RADIOLOGY I independently interpreted and visualized the CXR. My interpretation: Left sided opacity Radiology interpretation:  IMPRESSION:  1. Bilateral interstitial opacities, likely pulmonary edema.  2. Trace bilateral pleural effusions.    I independently interpreted and visualized the abd x-ray. My interpretation: No abnormal gas pattern Radiology interpretation:  IMPRESSION:  Nonobstructive bowel gas pattern.  PROCEDURES:  Critical Care performed: No   MEDICATIONS ORDERED IN ED: Medications - No data to display   IMPRESSION / MDM / ASSESSMENT AND PLAN / ED COURSE  I reviewed the triage vital signs and the nursing notes.                              Differential diagnosis includes, but is not limited to, infection, anemia, electrolyte abnormality, dehydration  Patient's presentation is most consistent with acute presentation with potential threat to life or bodily function.   The patient is on the cardiac monitor to evaluate for evidence of arrhythmia and/or significant heart rate changes.  Presented to the emergency department today because of concerns for increased confusion and falls.  On exam patient is confused and cannot give any significant history.  No evidence of any significant traumatic injuries on exam.  However he was found to be hypoxic on room air.  Wife states that he is not on oxygen at home.   Workup was initiated.  Chest x-ray was concerning for possible pulmonary edema and patient does have a mildly elevated BNP however no significant lower extremity swelling.  Patient in fact appears somewhat dry.  Did obtain a CT angio which did not show any PE or pneumonia.  COVID was negative.  Discussed with Dr. Para March with the hospitalist service for admission given hypoxia.      FINAL CLINICAL IMPRESSION(S) / ED DIAGNOSES   Final diagnoses:  Hypoxia       Note:  This document was prepared using Dragon voice recognition software and may include unintentional dictation errors.    Phineas Semen, MD 05/31/22 2250

## 2022-06-01 ENCOUNTER — Encounter: Payer: Self-pay | Admitting: Internal Medicine

## 2022-06-01 ENCOUNTER — Emergency Department: Payer: Medicare Other

## 2022-06-01 DIAGNOSIS — C9000 Multiple myeloma not having achieved remission: Secondary | ICD-10-CM | POA: Diagnosis present

## 2022-06-01 DIAGNOSIS — E860 Dehydration: Secondary | ICD-10-CM | POA: Diagnosis present

## 2022-06-01 DIAGNOSIS — Z1152 Encounter for screening for COVID-19: Secondary | ICD-10-CM | POA: Diagnosis not present

## 2022-06-01 DIAGNOSIS — D508 Other iron deficiency anemias: Secondary | ICD-10-CM | POA: Diagnosis not present

## 2022-06-01 DIAGNOSIS — G9341 Metabolic encephalopathy: Secondary | ICD-10-CM

## 2022-06-01 DIAGNOSIS — F0393 Unspecified dementia, unspecified severity, with mood disturbance: Secondary | ICD-10-CM | POA: Diagnosis present

## 2022-06-01 DIAGNOSIS — E119 Type 2 diabetes mellitus without complications: Secondary | ICD-10-CM | POA: Insufficient documentation

## 2022-06-01 DIAGNOSIS — F32A Depression, unspecified: Secondary | ICD-10-CM | POA: Diagnosis present

## 2022-06-01 DIAGNOSIS — K529 Noninfective gastroenteritis and colitis, unspecified: Secondary | ICD-10-CM | POA: Diagnosis not present

## 2022-06-01 DIAGNOSIS — F03918 Unspecified dementia, unspecified severity, with other behavioral disturbance: Secondary | ICD-10-CM | POA: Diagnosis present

## 2022-06-01 DIAGNOSIS — I6523 Occlusion and stenosis of bilateral carotid arteries: Secondary | ICD-10-CM | POA: Diagnosis present

## 2022-06-01 DIAGNOSIS — I10 Essential (primary) hypertension: Secondary | ICD-10-CM | POA: Diagnosis present

## 2022-06-01 DIAGNOSIS — R0902 Hypoxemia: Secondary | ICD-10-CM

## 2022-06-01 DIAGNOSIS — E872 Acidosis, unspecified: Secondary | ICD-10-CM | POA: Diagnosis present

## 2022-06-01 DIAGNOSIS — A4189 Other specified sepsis: Secondary | ICD-10-CM | POA: Diagnosis present

## 2022-06-01 DIAGNOSIS — I2510 Atherosclerotic heart disease of native coronary artery without angina pectoris: Secondary | ICD-10-CM | POA: Insufficient documentation

## 2022-06-01 DIAGNOSIS — D8481 Immunodeficiency due to conditions classified elsewhere: Secondary | ICD-10-CM | POA: Diagnosis present

## 2022-06-01 DIAGNOSIS — Z953 Presence of xenogenic heart valve: Secondary | ICD-10-CM | POA: Diagnosis not present

## 2022-06-01 DIAGNOSIS — R6510 Systemic inflammatory response syndrome (SIRS) of non-infectious origin without acute organ dysfunction: Secondary | ICD-10-CM | POA: Diagnosis not present

## 2022-06-01 DIAGNOSIS — N400 Enlarged prostate without lower urinary tract symptoms: Secondary | ICD-10-CM | POA: Insufficient documentation

## 2022-06-01 DIAGNOSIS — A080 Rotaviral enteritis: Secondary | ICD-10-CM | POA: Diagnosis present

## 2022-06-01 DIAGNOSIS — E785 Hyperlipidemia, unspecified: Secondary | ICD-10-CM | POA: Diagnosis present

## 2022-06-01 DIAGNOSIS — N390 Urinary tract infection, site not specified: Secondary | ICD-10-CM | POA: Diagnosis present

## 2022-06-01 DIAGNOSIS — R6520 Severe sepsis without septic shock: Secondary | ICD-10-CM | POA: Diagnosis present

## 2022-06-01 DIAGNOSIS — K909 Intestinal malabsorption, unspecified: Secondary | ICD-10-CM | POA: Diagnosis present

## 2022-06-01 DIAGNOSIS — D696 Thrombocytopenia, unspecified: Secondary | ICD-10-CM | POA: Diagnosis present

## 2022-06-01 DIAGNOSIS — Z6830 Body mass index (BMI) 30.0-30.9, adult: Secondary | ICD-10-CM | POA: Diagnosis not present

## 2022-06-01 DIAGNOSIS — R251 Tremor, unspecified: Secondary | ICD-10-CM | POA: Insufficient documentation

## 2022-06-01 DIAGNOSIS — E669 Obesity, unspecified: Secondary | ICD-10-CM | POA: Diagnosis present

## 2022-06-01 DIAGNOSIS — E876 Hypokalemia: Secondary | ICD-10-CM | POA: Diagnosis not present

## 2022-06-01 DIAGNOSIS — D518 Other vitamin B12 deficiency anemias: Secondary | ICD-10-CM | POA: Diagnosis present

## 2022-06-01 DIAGNOSIS — E871 Hypo-osmolality and hyponatremia: Secondary | ICD-10-CM | POA: Diagnosis present

## 2022-06-01 LAB — CBC WITH DIFFERENTIAL/PLATELET
Abs Immature Granulocytes: 0.64 10*3/uL — ABNORMAL HIGH (ref 0.00–0.07)
Basophils Absolute: 0.1 10*3/uL (ref 0.0–0.1)
Basophils Relative: 0 %
Eosinophils Absolute: 0 10*3/uL (ref 0.0–0.5)
Eosinophils Relative: 0 %
HCT: 32.4 % — ABNORMAL LOW (ref 39.0–52.0)
Hemoglobin: 10.1 g/dL — ABNORMAL LOW (ref 13.0–17.0)
Immature Granulocytes: 4 %
Lymphocytes Relative: 5 %
Lymphs Abs: 0.8 10*3/uL (ref 0.7–4.0)
MCH: 25.8 pg — ABNORMAL LOW (ref 26.0–34.0)
MCHC: 31.2 g/dL (ref 30.0–36.0)
MCV: 82.7 fL (ref 80.0–100.0)
Monocytes Absolute: 0.9 10*3/uL (ref 0.1–1.0)
Monocytes Relative: 6 %
Neutro Abs: 14.4 10*3/uL — ABNORMAL HIGH (ref 1.7–7.7)
Neutrophils Relative %: 85 %
Platelets: 86 10*3/uL — ABNORMAL LOW (ref 150–400)
RBC: 3.92 MIL/uL — ABNORMAL LOW (ref 4.22–5.81)
RDW: 20.2 % — ABNORMAL HIGH (ref 11.5–15.5)
WBC: 16.8 10*3/uL — ABNORMAL HIGH (ref 4.0–10.5)
nRBC: 0 % (ref 0.0–0.2)

## 2022-06-01 LAB — COMPREHENSIVE METABOLIC PANEL
ALT: 28 U/L (ref 0–44)
AST: 28 U/L (ref 15–41)
Albumin: 2.8 g/dL — ABNORMAL LOW (ref 3.5–5.0)
Alkaline Phosphatase: 52 U/L (ref 38–126)
Anion gap: 7 (ref 5–15)
BUN: 33 mg/dL — ABNORMAL HIGH (ref 8–23)
CO2: 21 mmol/L — ABNORMAL LOW (ref 22–32)
Calcium: 7.8 mg/dL — ABNORMAL LOW (ref 8.9–10.3)
Chloride: 100 mmol/L (ref 98–111)
Creatinine, Ser: 1.07 mg/dL (ref 0.61–1.24)
GFR, Estimated: >60 mL/min (ref >60)
Glucose, Bld: 152 mg/dL — ABNORMAL HIGH (ref 70–99)
Potassium: 3.4 mmol/L — ABNORMAL LOW (ref 3.5–5.1)
Sodium: 128 mmol/L — ABNORMAL LOW (ref 135–145)
Total Bilirubin: 0.6 mg/dL (ref 0.3–1.2)
Total Protein: 8.4 g/dL — ABNORMAL HIGH (ref 6.5–8.1)

## 2022-06-01 LAB — APTT: aPTT: 31 seconds (ref 24–36)

## 2022-06-01 LAB — RESPIRATORY PANEL BY PCR

## 2022-06-01 LAB — PROTIME-INR
INR: 1.2 (ref 0.8–1.2)
Prothrombin Time: 15.6 seconds — ABNORMAL HIGH (ref 11.4–15.2)

## 2022-06-01 LAB — PROCALCITONIN: Procalcitonin: 0.7 ng/mL

## 2022-06-01 LAB — TSH: TSH: 4.533 u[IU]/mL — ABNORMAL HIGH (ref 0.350–4.500)

## 2022-06-01 LAB — VITAMIN B12: Vitamin B-12: 143 pg/mL — ABNORMAL LOW (ref 180–914)

## 2022-06-01 LAB — PHOSPHORUS: Phosphorus: 3.4 mg/dL (ref 2.5–4.6)

## 2022-06-01 LAB — LACTIC ACID, PLASMA
Lactic Acid, Venous: 1.8 mmol/L (ref 0.5–1.9)
Lactic Acid, Venous: 2.6 mmol/L (ref 0.5–1.9)

## 2022-06-01 LAB — AMMONIA: Ammonia: <10 umol/L (ref 9–35)

## 2022-06-01 LAB — MRSA NEXT GEN BY PCR, NASAL: MRSA by PCR Next Gen: NOT DETECTED

## 2022-06-01 MED ORDER — VANCOMYCIN HCL IN DEXTROSE 1-5 GM/200ML-% IV SOLN
1000.0000 mg | Freq: Two times a day (BID) | INTRAVENOUS | Status: DC
  Administered 2022-06-01: 1000 mg via INTRAVENOUS
  Filled 2022-06-01: qty 200

## 2022-06-01 MED ORDER — LEVETIRACETAM 250 MG PO TABS
250.0000 mg | ORAL_TABLET | Freq: Every day | ORAL | Status: DC
  Administered 2022-06-01 – 2022-06-03 (×3): 250 mg via ORAL
  Filled 2022-06-01 (×3): qty 1

## 2022-06-01 MED ORDER — ZOLPIDEM TARTRATE 5 MG PO TABS: 2.5000 mg | ORAL_TABLET | Freq: Every evening | ORAL | Status: DC | PRN

## 2022-06-01 MED ORDER — VANCOMYCIN HCL 2000 MG/400ML IV SOLN
2000.0000 mg | Freq: Once | INTRAVENOUS | Status: AC
  Administered 2022-06-01: 2000 mg via INTRAVENOUS
  Filled 2022-06-01: qty 400

## 2022-06-01 MED ORDER — ONDANSETRON HCL 4 MG/2ML IJ SOLN: 4.0000 mg | Freq: Four times a day (QID) | INTRAMUSCULAR | Status: DC | PRN

## 2022-06-01 MED ORDER — COLCHICINE 0.6 MG PO TABS
0.6000 mg | ORAL_TABLET | Freq: Two times a day (BID) | ORAL | Status: DC
  Administered 2022-06-01 – 2022-06-04 (×7): 0.6 mg via ORAL
  Filled 2022-06-01 (×7): qty 1

## 2022-06-01 MED ORDER — METOPROLOL TARTRATE 5 MG/5ML IV SOLN: 5.0000 mg | INTRAVENOUS | Status: DC | PRN

## 2022-06-01 MED ORDER — SODIUM CHLORIDE 0.9 % IV SOLN: INTRAVENOUS | Status: AC

## 2022-06-01 MED ORDER — PIPERACILLIN-TAZOBACTAM 3.375 G IVPB
3.3750 g | Freq: Three times a day (TID) | INTRAVENOUS | Status: DC
  Administered 2022-06-01 – 2022-06-02 (×3): 3.375 g via INTRAVENOUS
  Filled 2022-06-01 (×3): qty 50

## 2022-06-01 MED ORDER — TRAZODONE HCL 50 MG PO TABS: 50.0000 mg | ORAL_TABLET | Freq: Every evening | ORAL | Status: DC | PRN

## 2022-06-01 MED ORDER — ACETAMINOPHEN 650 MG RE SUPP: 650.0000 mg | Freq: Four times a day (QID) | RECTAL | Status: DC | PRN

## 2022-06-01 MED ORDER — VENLAFAXINE HCL ER 75 MG PO CP24
225.0000 mg | ORAL_CAPSULE | Freq: Every day | ORAL | Status: DC
  Administered 2022-06-01 – 2022-06-04 (×4): 225 mg via ORAL
  Filled 2022-06-01 (×4): qty 3

## 2022-06-01 MED ORDER — MORPHINE SULFATE (PF) 2 MG/ML IV SOLN: 2.0000 mg | INTRAVENOUS | Status: DC | PRN

## 2022-06-01 MED ORDER — LACTATED RINGERS IV SOLN
150.0000 mL/h | INTRAVENOUS | Status: AC
  Administered 2022-06-01: 150 mL/h via INTRAVENOUS

## 2022-06-01 MED ORDER — POTASSIUM CHLORIDE CRYS ER 20 MEQ PO TBCR
40.0000 meq | EXTENDED_RELEASE_TABLET | Freq: Once | ORAL | Status: AC
  Administered 2022-06-01: 40 meq via ORAL
  Filled 2022-06-01: qty 2

## 2022-06-01 MED ORDER — GUAIFENESIN 100 MG/5ML PO LIQD: 5.0000 mL | ORAL | Status: DC | PRN

## 2022-06-01 MED ORDER — LEVETIRACETAM 500 MG PO TABS
500.0000 mg | ORAL_TABLET | Freq: Two times a day (BID) | ORAL | Status: DC
  Administered 2022-06-01: 500 mg via ORAL
  Filled 2022-06-01: qty 1

## 2022-06-01 MED ORDER — PROPRANOLOL HCL ER 60 MG PO CP24
60.0000 mg | ORAL_CAPSULE | Freq: Every day | ORAL | Status: DC
  Filled 2022-06-01: qty 1

## 2022-06-01 MED ORDER — LEVETIRACETAM 500 MG PO TABS
500.0000 mg | ORAL_TABLET | Freq: Every morning | ORAL | Status: DC
  Administered 2022-06-02 – 2022-06-04 (×3): 500 mg via ORAL
  Filled 2022-06-01 (×3): qty 1

## 2022-06-01 MED ORDER — ALLOPURINOL 100 MG PO TABS
300.0000 mg | ORAL_TABLET | Freq: Every day | ORAL | Status: DC
  Administered 2022-06-01 – 2022-06-04 (×4): 300 mg via ORAL
  Filled 2022-06-01: qty 1
  Filled 2022-06-01: qty 3
  Filled 2022-06-01: qty 1
  Filled 2022-06-01: qty 3

## 2022-06-01 MED ORDER — METOPROLOL TARTRATE 25 MG PO TABS
12.5000 mg | ORAL_TABLET | Freq: Two times a day (BID) | ORAL | Status: DC
  Administered 2022-06-01 – 2022-06-04 (×8): 12.5 mg via ORAL
  Filled 2022-06-01 (×9): qty 1

## 2022-06-01 MED ORDER — SODIUM CHLORIDE 0.9 % IV SOLN
2.0000 g | Freq: Once | INTRAVENOUS | Status: AC
  Administered 2022-06-01: 2 g via INTRAVENOUS
  Filled 2022-06-01: qty 12.5

## 2022-06-01 MED ORDER — VANCOMYCIN HCL IN DEXTROSE 1-5 GM/200ML-% IV SOLN: 1000.0000 mg | Freq: Once | INTRAVENOUS | Status: DC

## 2022-06-01 MED ORDER — HYDROXYZINE HCL 50 MG PO TABS
25.0000 mg | ORAL_TABLET | Freq: Three times a day (TID) | ORAL | Status: DC | PRN
  Administered 2022-06-01 – 2022-06-04 (×2): 25 mg via ORAL
  Filled 2022-06-01 (×2): qty 1

## 2022-06-01 MED ORDER — METRONIDAZOLE 500 MG/100ML IV SOLN
500.0000 mg | Freq: Two times a day (BID) | INTRAVENOUS | Status: DC
  Administered 2022-06-01: 500 mg via INTRAVENOUS
  Filled 2022-06-01: qty 100

## 2022-06-01 MED ORDER — ASPIRIN 81 MG PO TBEC
81.0000 mg | DELAYED_RELEASE_TABLET | Freq: Every day | ORAL | Status: DC
  Administered 2022-06-01 – 2022-06-04 (×4): 81 mg via ORAL
  Filled 2022-06-01 (×4): qty 1

## 2022-06-01 MED ORDER — ATORVASTATIN CALCIUM 20 MG PO TABS
40.0000 mg | ORAL_TABLET | Freq: Every day | ORAL | Status: DC
  Administered 2022-06-01 – 2022-06-04 (×4): 40 mg via ORAL
  Filled 2022-06-01 (×4): qty 2

## 2022-06-01 MED ORDER — FINASTERIDE 5 MG PO TABS
5.0000 mg | ORAL_TABLET | Freq: Every day | ORAL | Status: DC
  Administered 2022-06-01 – 2022-06-04 (×4): 5 mg via ORAL
  Filled 2022-06-01 (×4): qty 1

## 2022-06-01 MED ORDER — IPRATROPIUM-ALBUTEROL 0.5-2.5 (3) MG/3ML IN SOLN: 3.0000 mL | RESPIRATORY_TRACT | Status: DC | PRN

## 2022-06-01 MED ORDER — ACETAMINOPHEN 325 MG PO TABS
650.0000 mg | ORAL_TABLET | Freq: Four times a day (QID) | ORAL | Status: DC | PRN
  Administered 2022-06-01 (×2): 650 mg via ORAL
  Filled 2022-06-01 (×2): qty 2

## 2022-06-01 MED ORDER — SENNOSIDES-DOCUSATE SODIUM 8.6-50 MG PO TABS: 1.0000 | ORAL_TABLET | Freq: Every evening | ORAL | Status: DC | PRN

## 2022-06-01 MED ORDER — SODIUM CHLORIDE 0.9 % IV SOLN: 2.0000 g | Freq: Three times a day (TID) | INTRAVENOUS | Status: DC

## 2022-06-01 MED ORDER — MELATONIN 5 MG PO TABS: 5.0000 mg | ORAL_TABLET | Freq: Every evening | ORAL | Status: DC | PRN

## 2022-06-01 MED ORDER — ONDANSETRON HCL 4 MG PO TABS: 4.0000 mg | ORAL_TABLET | Freq: Four times a day (QID) | ORAL | Status: DC | PRN

## 2022-06-01 MED ORDER — HYDROCODONE-ACETAMINOPHEN 5-325 MG PO TABS
1.0000 | ORAL_TABLET | ORAL | Status: DC | PRN
  Administered 2022-06-02: 2 via ORAL
  Administered 2022-06-02: 1 via ORAL
  Administered 2022-06-03: 2 via ORAL
  Administered 2022-06-03: 1 via ORAL
  Filled 2022-06-01 (×2): qty 1
  Filled 2022-06-01 (×3): qty 2

## 2022-06-01 MED ORDER — ALBUTEROL SULFATE (2.5 MG/3ML) 0.083% IN NEBU: 2.5000 mg | INHALATION_SOLUTION | RESPIRATORY_TRACT | Status: DC | PRN

## 2022-06-01 MED ORDER — HYDRALAZINE HCL 20 MG/ML IJ SOLN: 10.0000 mg | INTRAMUSCULAR | Status: DC | PRN

## 2022-06-01 MED ORDER — IPRATROPIUM-ALBUTEROL 0.5-2.5 (3) MG/3ML IN SOLN: 3.0000 mL | Freq: Four times a day (QID) | RESPIRATORY_TRACT | Status: DC

## 2022-06-01 MED ORDER — IPRATROPIUM-ALBUTEROL 0.5-2.5 (3) MG/3ML IN SOLN
3.0000 mL | Freq: Two times a day (BID) | RESPIRATORY_TRACT | Status: DC
  Administered 2022-06-01 – 2022-06-02 (×4): 3 mL via RESPIRATORY_TRACT
  Filled 2022-06-01 (×4): qty 3

## 2022-06-01 MED ORDER — TAMSULOSIN HCL 0.4 MG PO CAPS
0.4000 mg | ORAL_CAPSULE | Freq: Every day | ORAL | Status: DC
  Administered 2022-06-01 – 2022-06-04 (×4): 0.4 mg via ORAL
  Filled 2022-06-01 (×4): qty 1

## 2022-06-01 MED ORDER — PRAZOSIN HCL 1 MG PO CAPS
10.0000 mg | ORAL_CAPSULE | Freq: Every day | ORAL | Status: DC
  Administered 2022-06-01: 10 mg via ORAL
  Filled 2022-06-01: qty 2
  Filled 2022-06-01: qty 10

## 2022-06-01 NOTE — Assessment & Plan Note (Addendum)
Depression/insomnia Chronic tremors History of PTSD Continue venlafaxine, zolpidem as needed Continue Keppra and primidone

## 2022-06-01 NOTE — Assessment & Plan Note (Signed)
Patient hypoxic to 88 in the ED but CTA chest unrevealing, negative for PE, showing atelectasis COVID-negative Supplemental oxygen

## 2022-06-01 NOTE — Consult Note (Signed)
Pharmacy Antibiotic Note  Alex Gomez is a 77 y.o. male admitted on 05/31/2022 with pneumonia.  Pharmacy has been consulted for Pip/tazo and vancomycin dosing. Pt brought to the ED after he sustained 2 falls on the day of arrival without any injury.  Tmax 100.6, WBC trending up. Procal 0.7, plt low, possibly due to an infection?. COVID negative on [place] 2 L/min.   CXR:  1. Bilateral interstitial opacities, likely pulmonary edema. 2. Trace bilateral pleural effusions.    Plan: Zosyn 3.375g IV q8h (4 hour infusion).  Pt received vancomycin 2000 mg x 1 loading dose followed by vancomycin 1000 mg q12H. Predicted AUC 435. Goal AUC 400-600. Vd 0.71, IBW, Scr 1.07. Will order MRSA PCR. If negative recommend to stop vancomycin.   Height: 6\' 1"  (185.4 cm) Weight: 104.3 kg (230 lb) IBW/kg (Calculated) : 79.9  Temp (24hrs), Avg:99.3 F (37.4 C), Min:98.4 F (36.9 C), Max:100.6 F (38.1 C)  Recent Labs  Lab 05/31/22 1542 06/01/22 0419  WBC 9.8 16.8*  CREATININE 1.16 1.07  LATICACIDVEN  --  1.8    Estimated Creatinine Clearance: 76.8 mL/min (by C-G formula based on SCr of 1.07 mg/dL).    Allergies  Allergen Reactions   Shellfish Allergy Shortness Of Breath    Antimicrobials this admission: 5/31 pip/tazo >>  5/30 cefepime >> 5/31 5/30 vancomycin >>   Dose adjustments this admission: None  Microbiology results: 5/31 BCx: pending 5/31 MRSA PCR: ordered.   Thank you for allowing pharmacy to be a part of this patient's care.  Ronnald Ramp, PharmD, BCPS 06/01/2022 7:59 AM

## 2022-06-01 NOTE — ED Notes (Signed)
Patient wants to leave (confused), wife states patient is becoming agitated and trying to get up (this RN have not witnessed) due to not receiving scheduled medications. Provider made aware and will be down after ICU visits to speak with patient and wife.

## 2022-06-01 NOTE — H&P (Addendum)
History and Physical    Patient: Alex Gomez ZOX:[id number] DOB: 1945-11-15 DOA: 05/31/2022 DOS: the patient was seen and examined on 06/01/2022 PCP: [facility]  Patient coming from: Home  Chief Complaint:  Chief Complaint  Patient presents with   Weakness   Fall   Constipation    HPI: [name] is a 77 y.o. male with medical history significant for Multiple myeloma, HTN, CAD, AVS status post bioprosthetic AVR, bilateral carotid stenosis, HLD, chronic tremor, dementia, who has been declining functionally for the last few months who was brought to the ED after he sustained 2 falls on the day of arrival without any injury.  He was previously at his baseline but over the past couple days he appeared to be more tremulous than usual and then on the day of arrival he was noted to be incontinent of urine and stool which is unusual for him. ED course and data review: BP 159/66 and 99.5 with otherwise normal vitals.  Troponin 12 and BNP 108.CBC notable for hemoglobin 10.9 which is his baseline.  New thrombocytopenia of 92,000 compared to 11 months prior, mild hyponatremia of 131.  Urinalysis with small leukocyte esterase.  COVID-negative. EKG, personally viewed and interpreted showing a junctional rhythm of 112 with no ischemic ST-T wave changes. Chest x-ray showing bilateral interstitial opacities likely pulmonary edema.  CT head negative.  Abdominal exam with a nonobstructive bowel gas pattern and CTA chest negative for PE. Observation requested.     Social History:  reports that he quit smoking about 25 years ago. His smoking use included cigarettes. He started smoking about 55 years ago. He has a 30.00 pack-year smoking history. He has never used smokeless tobacco. No history on file for alcohol use and drug use.  Allergies  Allergen Reactions   Shellfish Allergy Shortness Of Breath    No family history on file.  Prior to Admission medications   Medication Sig Start  Date End Date Taking? Authorizing Provider  allopurinol (ZYLOPRIM) 300 MG tablet Take 300 mg by mouth daily.   Yes [provider]  aspirin EC 81 MG tablet Take 81 mg by mouth daily.   Yes [provider]  atorvastatin (LIPITOR) 40 MG tablet Take 40 mg by mouth daily.   Yes [provider]  carboxymethylcellulose (REFRESH PLUS) 0.5 % SOLN 1 drop 3 (three) times daily as needed.   Yes [provider]  Cholecalciferol 25 MCG (1000 UT) tablet Take 1,000 Units by mouth daily.   Yes [provider]  clobetasol cream (TEMOVATE) 0.05 % Apply 1 Application topically 2 (two) times daily. 05/20/22  Yes [provider]  coal tar (NEUTROGENA T-GEL) 0.5 % shampoo Apply 1 Application topically at bedtime as needed. 01/08/22  Yes [provider]  Coenzyme Q10 100 MG capsule Take 100 mg by mouth 2 (two) times daily.   Yes [provider]  colchicine 0.6 MG tablet Take 1 tablet by mouth 2 (two) times daily. 04/09/22  Yes [provider]  desonide (DESOWEN) 0.05 % cream Apply 1 Application topically 2 (two) times daily. 05/21/22  Yes [provider]  finasteride (PROSCAR) 5 MG tablet Take 5 mg by mouth at bedtime.   Yes [provider]  fluconazole (DIFLUCAN) 200 MG tablet Take 200 mg by mouth once a week. 04/03/22  Yes [provider]  hydrOXYzine (ATARAX) 10 MG tablet Take 10 mg by mouth daily as needed. 05/20/22  Yes [provider]  ipratropium (  ATROVENT) 0.06 % nasal spray Place 2 sprays into both nostrils daily as needed for rhinitis.   Yes [provider]  ketoconazole (NIZORAL) 2 % cream Apply 1 application topically daily. Apply 1 application to scales on sides of nose 09/11/19  Yes [provider]  levETIRAcetam (KEPPRA) 500 MG tablet Take 1 tablet (500 mg total) by mouth 2 (two) times daily. Patient taking differently: Take 500-750 mg by mouth 2 (two) times daily. I tablet qam 1.5  tablet at bedtime 01/30/20  Yes Lonia Blood, MD  melatonin 5 MG TABS Take 5 mg by mouth at bedtime as needed.   Yes [provider]  metoprolol tartrate (LOPRESSOR) 25 MG tablet Take 12.5 mg by mouth 2 (two) times daily.   Yes [provider]  Multiple Vitamins-Minerals (MULTIVITAMIN WITH MINERALS) tablet Take 1 tablet by mouth daily.   Yes [provider]  prazosin (MINIPRESS) 5 MG capsule Take 10 mg by mouth at bedtime.   Yes [provider]  primidone (MYSOLINE) 50 MG tablet Take 50 mg by mouth 2 (two) times daily. 05/23/22  Yes [provider]  propranolol ER (INDERAL LA) 60 MG 24 hr capsule Take 60 mg by mouth daily. 05/30/22  Yes [provider]  tamsulosin (FLOMAX) 0.4 MG CAPS capsule Take 0.4 mg by mouth daily. bedtime   Yes [provider]  triamcinolone ointment (KENALOG) 0.1 % Apply 1 Application topically 2 (two) times daily. 12/03/11  Yes [provider]  venlafaxine XR (EFFEXOR-XR) 150 MG 24 hr capsule Take 225 mg by mouth daily. Take 225 mg by mouth once daily   Yes [provider]  zolpidem (AMBIEN) 5 MG tablet Take 2.5 mg by mouth at bedtime as needed. 04/24/22  Yes [provider]  Ipratropium-Albuterol (COMBIVENT RESPIMAT) 20-100 MCG/ACT AERS respimat Inhale 1 puff into the lungs every 6 (six) hours as needed for wheezing or shortness of breath. Patient not taking: Reported on 05/31/2022    [provider]    Physical Exam: Vitals:   05/31/22 2320 06/01/22 0130 06/01/22 0132 06/01/22 0300  BP: (!) 121/56 (!) 140/71    Pulse: 94 (!) 107    Resp: 17     Temp:   (!) 100.6 F (38.1 C)   TempSrc:   Oral   SpO2: 96% 93%    Weight:      Height:    6\' 1"  (1.854 m)   Physical Exam Vitals and nursing note reviewed.  Constitutional:      General: He is not in acute distress.    Interventions: Nasal cannula in place.     Comments: Patient restless, agitated, confused,  Wife at  bedside  HENT:     Head: Normocephalic and atraumatic.  Cardiovascular:     Rate and Rhythm: Regular rhythm. Tachycardia present.     Heart sounds: Normal heart sounds.  Pulmonary:     Effort: Pulmonary effort is normal.     Breath sounds: Normal breath sounds.  Abdominal:     Palpations: Abdomen is soft.     Tenderness: There is no abdominal tenderness.  Neurological:     General: No focal deficit present.     Mental Status: He is disoriented.     Labs on Admission: I have personally reviewed following labs and imaging studies  CBC: Recent Labs  Lab 05/31/22 1542  WBC 9.8  HGB 10.9*  HCT 36.7*  MCV 84.2  PLT 92*   Basic Metabolic Panel: Recent Labs  Lab 05/31/22 1542  NA 131*  K 3.8  CL 100  CO2 21*  GLUCOSE 125*  BUN 34*  CREATININE 1.16  CALCIUM 8.1*   GFR: CrCl cannot be calculated (Unknown ideal weight.). Liver Function Tests: No results for input(s): "AST", "ALT", "ALKPHOS", "BILITOT", "PROT", "ALBUMIN" in the last 168 hours. No results for input(s): "LIPASE", "AMYLASE" in the last 168 hours. No results for input(s): "AMMONIA" in the last 168 hours. Coagulation Profile: No results for input(s): "INR", "PROTIME" in the last 168 hours. Cardiac Enzymes: No results for input(s): "CKTOTAL", "CKMB", "CKMBINDEX", "TROPONINI" in the last 168 hours. BNP (last 3 results) No results for input(s): "PROBNP" in the last 8760 hours. HbA1C: No results for input(s): "HGBA1C" in the last 72 hours. CBG: Recent Labs  Lab 05/31/22 1543 05/31/22 1838  GLUCAP 88 106*   Lipid Profile: No results for input(s): "CHOL", "HDL", "LDLCALC", "TRIG", "CHOLHDL", "LDLDIRECT" in the last 72 hours. Thyroid Function Tests: No results for input(s): "TSH", "T4TOTAL", "FREET4", "T3FREE", "THYROIDAB" in the last 72 hours. Anemia Panel: No results for input(s): "VITAMINB12", "FOLATE", "FERRITIN", "TIBC", "IRON", "RETICCTPCT" in the last 72 hours. Urine analysis:    Component Value  Date/Time   COLORURINE YELLOW (A) 05/31/2022 1904   APPEARANCEUR HAZY (A) 05/31/2022 1904   LABSPEC 1.019 05/31/2022 1904   PHURINE 5.0 05/31/2022 1904   GLUCOSEU NEGATIVE 05/31/2022 1904   HGBUR NEGATIVE 05/31/2022 1904   BILIRUBINUR NEGATIVE 05/31/2022 1904   KETONESUR NEGATIVE 05/31/2022 1904   PROTEINUR NEGATIVE 05/31/2022 1904   NITRITE NEGATIVE 05/31/2022 1904   LEUKOCYTESUR SMALL (A) 05/31/2022 1904    Radiological Exams on Admission: CT Head Wo Contrast  Result Date: 06/01/2022 CLINICAL DATA:  Altered mental status, confusion EXAM: CT HEAD WITHOUT CONTRAST TECHNIQUE: Contiguous axial images were obtained from the base of the skull through the vertex without intravenous contrast. RADIATION DOSE REDUCTION: This exam was performed according to the departmental dose-optimization program which includes automated exposure control, adjustment of the mA and/or kV according to patient size and/or use of iterative reconstruction technique. COMPARISON:  None Available. FINDINGS: Brain: Normal anatomic configuration. Parenchymal volume loss is commensurate with the patient's age. Mild periventricular white matter changes are present likely reflecting the sequela of small vessel ischemia. No abnormal intra or extra-axial mass lesion or fluid collection. No abnormal mass effect or midline shift. No evidence of acute intracranial hemorrhage or infarct. Ventricular size is normal. Cerebellum unremarkable. Vascular: No asymmetric hyperdense vasculature at the skull base. Skull: Intact Sinuses/Orbits: Paranasal sinuses are clear. Orbits are unremarkable. Other: Mastoid air cells and middle ear cavities are clear. IMPRESSION: 1. No acute intracranial hemorrhage or infarct. 2. Mild senescent change. Electronically Signed   By: Helyn Numbers M.D.   On: 06/01/2022 00:24   CT Angio Chest PE W and/or Wo Contrast  Result Date: 05/31/2022 CLINICAL DATA:  Hypoxia and weakness, initial encounter EXAM: CT  ANGIOGRAPHY CHEST WITH CONTRAST TECHNIQUE: Multidetector CT imaging of the chest was performed using the standard protocol during bolus administration of intravenous contrast. Multiplanar CT image reconstructions and MIPs were obtained to evaluate the vascular anatomy. RADIATION DOSE REDUCTION: This exam was performed according to the departmental dose-optimization program which includes automated exposure control, adjustment of the mA and/or kV according to patient size and/or use of iterative reconstruction technique. CONTRAST:  OMNIPAQUE IOHEXOL 350 MG/ML SOLN COMPARISON:  Chest from earlier in the same day. FINDINGS: Cardiovascular: Atherosclerotic calcifications of the thoracic aorta are noted. No dissection or aneurysmal dilatation is seen.  Aortic valve replacement is noted. Heavy coronary calcifications are seen. The pulmonary artery shows a normal branching pattern bilaterally. No filling defect to suggest pulmonary embolism is noted. Mediastinum/Nodes: Thoracic inlet is within normal limits. No hilar or mediastinal adenopathy is noted. The esophagus is unremarkable. Lungs/Pleura: Bilateral lower lobe atelectatic changes are seen. No sizable effusion is noted. No parenchymal nodule is seen. Mild scarring is noted in the left lung. Upper Abdomen: Visualized upper abdomen shows no acute abnormality. Musculoskeletal: Degenerative changes of the thoracic spine are noted. No acute rib abnormality is seen. Review of the MIP images confirms the above findings. IMPRESSION: No evidence of pulmonary emboli. Lower lobe atelectatic changes. Aortic Atherosclerosis (ICD10-I70.0). Electronically Signed   By: Alcide Clever M.D.   On: 05/31/2022 21:44   DG Abdomen 1 View  Result Date: 05/31/2022 CLINICAL DATA:  Abdominal pain and constipation EXAM: ABDOMEN - 1 VIEW COMPARISON:  None Available. FINDINGS: Nonobstructive bowel gas pattern. No free air or pneumatosis. No abnormal radio-opaque calculi or mass effect. No  acute or substantial osseous abnormality. The sacrum and coccyx are partially obscured by overlying bowel contents. IMPRESSION: Nonobstructive bowel gas pattern. Electronically Signed   By: Agustin Cree M.D.   On: 05/31/2022 16:31   DG Chest 2 View  Result Date: 05/31/2022 CLINICAL DATA:  Weakness EXAM: CHEST - 2 VIEW COMPARISON:  Chest radiograph dated 01/21/2020 FINDINGS: Normal lung volumes. Bilateral interstitial opacities. Trace bilateral pleural effusions. No pneumothorax. Similar cardiomediastinal silhouette. Median sternotomy wires are nondisplaced. IMPRESSION: 1. Bilateral interstitial opacities, likely pulmonary edema. 2. Trace bilateral pleural effusions. Electronically Signed   By: Agustin Cree M.D.   On: 05/31/2022 16:28     Data Reviewed: Relevant notes from primary care and specialist visits, past discharge summaries as available in EHR, including Care Everywhere. Prior diagnostic testing as pertinent to current admission diagnoses Updated medications and problem lists for reconciliation ED course, including vitals, labs, imaging, treatment and response to treatment Triage notes, nursing and pharmacy notes and ED provider's notes Notable results as noted in HPI   Assessment and Plan: * Hypoxia Patient hypoxic to 88 in the ED but CTA chest unrevealing, negative for PE, showing atelectasis COVID-negative Supplemental oxygen  Acute metabolic encephalopathy SIRS, possible sepsis Frequent falls/increased tremors Patient presents with confusion, increased tremors, frequent falls Following admission develops fever 100.6 and tachycardia to 107 Earlier workup was unrevealing, with unremarkable UA, CTA PE and chest x-ray, CT head No other stigmata of infection except for hypoxia Will get full respiratory viral panel Sepsis workup being ordered at this time IV fluid bolus and antibiotics for sepsis of unknown source ordered Suspecting SIRS but if more convincing for sepsis based on  workup, can consider IR consult for LP Neurology consult   Multiple myeloma (HCC) Thrombocytopenia Patient with questionably new thrombocytopenia of 92,000.  Last found on record was from 11 months ago at 1 57,000 Follows at the [place] Continue to monitor  Dementia without behavioral disturbance (HCC) Depression/insomnia Chronic tremors History of PTSD Continue venlafaxine, zolpidem as needed Continue Keppra and primidone   CAD (coronary artery disease) Continue aspirin, atorvastatin and metoprolol  Benign essential hypertension Continue metoprolol   CRITICAL CARE Performed by: Andris Baumann   Total critical care time: 60 minutes  Critical care time was exclusive of separately billable procedures and treating other patients.  Critical care was necessary to treat or prevent imminent or life-threatening deterioration.  Critical care was time spent personally by me on the following activities: development  of treatment plan with patient and/or surrogate as well as nursing, discussions with consultants, evaluation of patient's response to treatment, examination of patient, obtaining history from patient or surrogate, ordering and performing treatments and interventions, ordering and review of laboratory studies, ordering and review of radiographic studies, pulse oximetry and re-evaluation of patient's condition.  DVT prophylaxis: Lovenox  Consults: Neurology, Dr. Iver Nestle  Advance Care Planning:   Code Status: Prior   Family Communication: Wife at bedside  Disposition Plan: Back to previous home environment  Severity of Illness: The appropriate patient status for this patient is INPATIENT. Inpatient status is judged to be reasonable and necessary in order to provide the required intensity of service to ensure the patient's safety. The patient's presenting symptoms, physical exam findings, and initial radiographic and laboratory data in the context of their chronic comorbidities  is felt to place them at high risk for further clinical deterioration. Furthermore, it is not anticipated that the patient will be medically stable for discharge from the hospital within 2 midnights of admission.   * I certify that at the point of admission it is my clinical judgment that the patient will require inpatient hospital care spanning beyond 2 midnights from the point of admission due to high intensity of service, high risk for further deterioration and high frequency of surveillance required.*  Author: Andris Baumann, MD 06/01/2022 12:38 AM  For on call review [email or web address].

## 2022-06-01 NOTE — Progress Notes (Signed)
Pharmacy Antibiotic Note  Alex Gomez is a 77 y.o. male admitted on 05/31/2022 with sepsis.  Pharmacy has been consulted for Cefepime &  Vancomycin dosing for 7 days.  Plan: Cefepime 2 gm q8hr per indication & renal fxn.  Pt given Vancomycin 2000 mg once. Vancomycin 1000 mg IV Q 12 hrs. Goal AUC 400-550. Expected AUC: 468.8 SCr used: 1.16  Pharmacy will continue to follow and will adjust abx dosing whenever warranted.  Temp (24hrs), Avg:99.5 F (37.5 C), Min:98.4 F (36.9 C), Max:100.6 F (38.1 C)   Recent Labs  Lab 05/31/22 1542  WBC 9.8  CREATININE 1.16    Estimated Creatinine Clearance: 70.9 mL/min (by C-G formula based on SCr of 1.16 mg/dL).    Allergies  Allergen Reactions   Shellfish Allergy Shortness Of Breath    Antimicrobials this admission: 5/31 Cefepime >> x 7 days 5/31 Vancomycin >> x 7 days 5/31 Flagyl >> x 7 days  Microbiology results: 5/31 BCx: Pending  Thank you for allowing pharmacy to be a part of this patient's care.  Otelia Sergeant, PharmD, [facility] 06/01/2022 3:14 AM

## 2022-06-01 NOTE — Assessment & Plan Note (Signed)
Thrombocytopenia Patient with questionably new thrombocytopenia of 92,000.  Last found on record was from 11 months ago at 1 57,000 Follows at the [place] Continue to monitor

## 2022-06-01 NOTE — Progress Notes (Signed)
77 year old with history of multiple myeloma, HTN, C AD, AVR status post bioprosthetic valve AVR, bilateral carotid stenosis, HLD, chronic tremor, and dementia brought to the hospital for a general decline over the last few months.  Upon admission patient's UA showed possible urinary tract infection, chest x-ray showing bilateral increased opacities/pulmonary edema.  CT of the head was negative.  Abdominal x-ray and CTA chest was also negative.  Initially patient slightly hypoxic as well.  Seen and examined at bedside.  Patient is feeling better but still has mild exertional dyspnea.  Saturating 100% on room air.  Still having some productive coughing.  Keeps on telling me he wants to go home Spouse present at bedside.  Hypoxia on 2 L nasal cannula Bibasilar crackles.  Normal sinus rhythm.  Abdomen is nontender nondistended.  Acute metabolic encephalopathy-no clear etiology but mentation is now back to baseline this morning.  CT of the head is negative.  This could possibly from either hypoxia or mild urinary tract infection.  Continue empiric antibiotics.  Will check TSH, B12, folate, ammonia.  Urinary tract infection without hematuria-on empiric IV antibiotics.  Urine cultures not sent on admission  Hypoxia with possible bronchitis/pneumonia-on empiric vancomycin, Flagyl and cefepime.  Discontinue Flagyl/cefepime, changed to Zosyn .follow culture data.  Bronchodilators, I-S/flutter valve.  Respiratory panel  Hypokalemia-as needed repletion  Frequent falls/tremors-supportive care.  PT/OT  Coronary artery disease-continue home aspirin, statin  Multiple myeloma/thrombocytopenia-patient [facility] at the [facility]  Essential hypertension-continue home meds.  IV as needed  History of dementia, depression, chronic tremors, PTSD-continue home meds   I am not exactly sure why patient is both on propranolol and metoprolol.  Will request pharmacy to clarify this.  For now we will discontinue  propranolol   Stephania Fragmin MD [facility]

## 2022-06-01 NOTE — Assessment & Plan Note (Signed)
Continue aspirin, atorvastatin and metoprolol

## 2022-06-01 NOTE — ED Notes (Signed)
Patient given cup of ice water and crackers.

## 2022-06-01 NOTE — Plan of Care (Signed)
Discussed with primary team via secure chat, patient has multiple medical reasons for encephalopathy at this time and Dr. Nelson Chimes feels it is reasonable to start by treating those etiologies and to reach out if patient is not improving with supportive care.  Given lack of EEG availability over the weekend and history of dementia I do think it is reasonable to get a routine EEG today unless the patient is already back to baseline I also appreciate that he switched cefepime to a less neurotoxic antibiotic  Neurology will certainly be available if a full consultation is felt to be needed

## 2022-06-01 NOTE — Progress Notes (Signed)
Elink monitoring for the code sepsis protocol.  

## 2022-06-01 NOTE — TOC Initial Note (Signed)
Transition of Care [facility]) - Initial/Assessment Note    Patient Details  Name: Alex Gomez MRN: [id number] Date of Birth: 05-11-1945  Transition of Care [facility]) CM/SW Contact:    Darolyn Rua, LCSW Phone Number: 06/01/2022, 1:01 PM  Clinical Narrative:                  CSW spoke with patient at bedside, confirmed active with [facility] PT and wishes to continue. Per RN patient had to be put on O2, he does not wear O2 at baseline. TOC will follow for discharge planning needs.   Patient reports no additional dme needs at home, he has a 3in1 and RW.   Expected Discharge Plan: Home w Home Health Services Barriers to Discharge: Continued Medical Work up   Patient Goals and CMS Choice Patient states their goals for this hospitalization and ongoing recovery are:: to go home          Expected Discharge Plan and Services       Living arrangements for the past 2 months: Single Family Home                                      Prior Living Arrangements/Services Living arrangements for the past 2 months: Single Family Home Lives with:: Spouse                   Activities of Daily Living Home Assistive Devices/Equipment: Environmental consultant (specify type) ADL Screening (condition at time of admission) Patient's cognitive ability adequate to safely complete daily activities?: Yes Is the patient deaf or have difficulty hearing?: No Does the patient have difficulty seeing, even when wearing glasses/contacts?: No Does the patient have difficulty concentrating, remembering, or making decisions?: Yes Patient able to express need for assistance with ADLs?: Yes Does the patient have difficulty dressing or bathing?: Yes Independently performs ADLs?: No Communication: Independent Dressing (OT): Needs assistance Is this a change from baseline?: Change from baseline, expected to last <3days Grooming: Independent Feeding: Independent Bathing: Needs assistance Is this a change from  baseline?: Change from baseline, expected to last <3 days Toileting: Needs assistance Is this a change from baseline?: Change from baseline, expected to last <3 days In/Out Bed: Needs assistance Is this a change from baseline?: Change from baseline, expected to last <3 days Walks in Home: Needs assistance Is this a change from baseline?: Change from baseline, expected to last <3 days Does the patient have difficulty walking or climbing stairs?: Yes Weakness of Legs: Both Weakness of Arms/Hands: None  Permission Sought/Granted                  Emotional Assessment       Orientation: : Oriented to Place, Oriented to  Time, Oriented to Self, Oriented to Situation Alcohol / Substance Use: Not Applicable Psych Involvement: No (comment)  Admission diagnosis:  Hypoxia [R09.02] Patient Active Problem List   Diagnosis Date Noted   Tremor 06/01/2022   Type 2 diabetes mellitus without complication (HCC) 06/01/2022   Atherosclerotic heart disease of native coronary artery without angina pectoris 06/01/2022   Benign prostatic hyperplasia without lower urinary tract symptoms 06/01/2022   Multiple myeloma (HCC) 06/01/2022   SIRS (systemic inflammatory response syndrome) (HCC) 06/01/2022   Acute metabolic encephalopathy 06/01/2022   Thrombocytopenia (HCC) 05/31/2022   Dementia without behavioral disturbance (HCC) 05/31/2022   Frequent falls 05/31/2022   Hypoxia 05/31/2022  Seizure (HCC) 01/30/2020   Acute respiratory failure with hypoxia (HCC)    Seizure-like activity (HCC) 01/29/2020   Depressive disorder 02/03/2018   Diverticulosis of colon 02/03/2018   Moderate aortic valve stenosis 04/04/2017   Benign essential hypertension 08/10/2014   Bilateral carotid artery stenosis 02/22/2014   Moderate mitral insufficiency 02/22/2014   CAD (coronary artery disease) 12/04/2013   Hyperlipidemia, mixed 12/04/2013   PCP:  [facility]:   [facility] [employer] [place], [place] - [address] [place] [address] Phone: [phone] Fax: [phone]     Social Determinants of Health (SDOH) Social History: SDOH Screenings   Food Insecurity: No Food Insecurity (06/01/2022)  Housing: Low Risk  (06/01/2022)  Transportation Needs: No Transportation Needs (06/01/2022)  Utilities: Not At Risk (06/01/2022)  Depression (PHQ2-9): Medium Risk (08/20/2018)  Tobacco Use: Medium Risk (06/01/2022)   SDOH Interventions:     Readmission Risk Interventions     No data to display

## 2022-06-01 NOTE — Assessment & Plan Note (Addendum)
SIRS, possible sepsis Frequent falls/increased tremors Patient presents with confusion, increased tremors, frequent falls Following admission develops fever 100.6 and tachycardia to 107 Earlier workup was unrevealing, with unremarkable UA, CTA PE and chest x-ray, CT head No other stigmata of infection except for hypoxia Will get full respiratory viral panel Sepsis workup being ordered at this time IV fluid bolus and antibiotics for sepsis of unknown source ordered Suspecting SIRS but if more convincing for sepsis based on workup, can consider IR consult for LP Neurology consult

## 2022-06-01 NOTE — Progress Notes (Signed)
Physical Therapy Treatment Patient Details Name: Alex Gomez MRN: [id number] DOB: 02-12-1945 Today's Date: 06/01/2022   History of Present Illness Patient is a 77 y.o male admitted to the ED status post 2 falls at homedue to self reported weakness. Upon admission was diagnosed with encephalopathy. Co-morbidities inclyde hypoxia, HTN, coronary artery stenosis, CAD, dementia, multiple myeloma, tremors and SIRS.    PT Comments    Arrived to patient in bed with their wife at their bedside. Patient stated they were feeling good and ready to move. Mod assistance needed for bed mobility (supine to sit). Min guard for ambulation in the hallway for 200 ft. Patients spouse mentioned that the patient regularly receives PT, OT and psych therapy at home. Patient requested multiple times to be moved to a room upstairs out of the ED. Informed patient we had no control in that matter.To improve patients safety with balance and ambulation Pt will continue to receive skilled PT services while admitted and will defer to TOC/care team for updates regarding disposition planning.      Recommendations for follow up therapy are one component of a multi-disciplinary discharge planning process, led by the attending physician.  Recommendations may be updated based on patient status, additional functional criteria and insurance authorization.  Follow Up Recommendations       Assistance Recommended at Discharge Intermittent Supervision/Assistance  Patient can return home with the following A little help with walking and/or transfers;A little help with bathing/dressing/bathroom;Help with stairs or ramp for entrance   Equipment Recommendations  Rolling walker (2 wheels)    Recommendations for Other Services       Precautions / Restrictions Precautions Precautions: Fall Restrictions Weight Bearing Restrictions: No     Mobility  Bed Mobility Overal bed mobility: Needs Assistance Bed Mobility: Supine to Sit,  Sit to Supine, Rolling Rolling: Min guard   Supine to sit: Mod assist Sit to supine: Min guard   General bed mobility comments: Patient was able to roll in to sidelying and lay down with no assist. Mod assist was needed for sitting up to EOB.    Transfers Overall transfer level: Independent Equipment used: None               General transfer comment: patient stood up independently with min guard from PT.    Ambulation/Gait Ambulation/Gait assistance: Min guard Gait Distance (Feet): 200 Feet   Gait Pattern/deviations: Decreased step length - left, Decreased stance time - right, Decreased weight shift to right, Staggering left       General Gait Details: PT provided min guard assist while ambulating 261ft in the hallway. Patient maintained hands behind their back while walking. Consistently demonstrated decreased step length on their left side which resulted in patients inability to walk in a straight line without correction.   Stairs             Wheelchair Mobility    Modified Rankin (Stroke Patients Only)       Balance Overall balance assessment: Mild deficits observed, not formally tested                                          Cognition Arousal/Alertness: Awake/alert Behavior During Therapy: WFL for tasks assessed/performed Overall Cognitive Status: Within Functional Limits for tasks assessed  Exercises      General Comments        Pertinent Vitals/Pain Pain Assessment Pain Assessment: No/denies pain    Home Living Family/patient expects to be discharged to:: Private residence Living Arrangements: Spouse/significant other Available Help at Discharge: Available PRN/intermittently;Family Type of Home: House         Home Layout: Two level Home Equipment: Agricultural consultant (2 wheels);Cane - single point;Shower seat Additional Comments: Patient has a stair lift at  home to reach the second floor that he does use.    Prior Function            PT Goals (current goals can now be found in the care plan section) Acute Rehab PT Goals Patient Stated Goal: To go home PT Goal Formulation: With patient/family Time For Goal Achievement: 06/15/22 Potential to Achieve Goals: Good    Frequency    Min 2X/week      PT Plan      Co-evaluation              AM-PAC PT "6 Clicks" Mobility   Outcome Measure  Help needed turning from your back to your side while in a flat bed without using bedrails?: A Little Help needed moving from lying on your back to sitting on the side of a flat bed without using bedrails?: A Lot Help needed moving to and from a bed to a chair (including a wheelchair)?: A Little Help needed standing up from a chair using your arms (e.g., wheelchair or bedside chair)?: A Little Help needed to walk in hospital room?: A Little Help needed climbing 3-5 steps with a railing? : A Little 6 Click Score: 17    End of Session   Activity Tolerance: Patient tolerated treatment well   Nurse Communication: Other (comment);Mobility status (Urine output container almost full.) PT Visit Diagnosis: Unsteadiness on feet (R26.81);Other abnormalities of gait and mobility (R26.89);Repeated falls (R29.6);Muscle weakness (generalized) (M62.81);History of falling (Z91.81)     Time: 1610-9604 PT Time Calculation (min) (ACUTE ONLY): 19 min  Charges:                        Malachi Carl, SPT    Malachi Carl 06/01/2022, 12:32 PM

## 2022-06-01 NOTE — Consult Note (Signed)
PHARMACY CONSULT NOTE - FOLLOW UP  Pharmacy Consult for Electrolyte Monitoring and Replacement   Recent Labs: Potassium (mmol/L)  Date Value  06/01/2022 3.4 (L)   Calcium (mg/dL)  Date Value  29/52/8413 7.8 (L)   Albumin (g/dL)  Date Value  24/40/1027 2.8 (L)   Sodium (mmol/L)  Date Value  06/01/2022 128 (L)   Corrected Ca 8.8.   Assessment: 77 y.o. male with medical history significant for Multiple myeloma, HTN, CAD, AVS status post bioprosthetic AVR, bilateral carotid stenosis, HLD, chronic tremor, dementia, who has been declining functionally for the last few months who was brought to the ED after he sustained 2 falls on the day of arrival without any injury, and concern for an infection.   Fluids: LR @150  ml/hr  Goal of Therapy:  WNL  Plan:  Kcl 40 mEq x 1 F/u with AM labs.   Ronnald Ramp ,PharmD Clinical Pharmacist 06/01/2022 8:07 AM

## 2022-06-01 NOTE — Assessment & Plan Note (Signed)
Continue metoprolol. 

## 2022-06-01 NOTE — Evaluation (Signed)
Occupational Therapy Evaluation Patient Details Name: Alex Gomez MRN: [id number] DOB: Oct 17, 1945 Today's Date: 06/01/2022   History of Present Illness Patient is a 77 y.o male admitted to the ED status post 2 falls at homedue to self reported weakness. Upon admission was diagnosed with encephalopathy. Co-morbidities inclyde hypoxia, HTN, coronary artery stenosis, CAD, dementia, multiple myeloma, tremors and SIRS.   Clinical Impression   Upon entering the room, pt supine in bed with wife present in room and agreeable to OT intervention. Pt lives at home with wife and is Ind in self care and mobility but did recently begin using RW for safety. Use of stairlift in home to get to second floor where bedrooms are all located. Pt performing bed mobility with min A and stands with supervision for ambulation in room. Pt reports need for BM and while therapist attempting to manage lines and leads to get to commode pt begins bearing down while seated on bed for BM. Pt then rolling and therapist placing bed pan under him. RN notified. Pt's wife reporting he is moving "how he normally does" and OT recommends home health therapies resumed once discharged from hospital. Pt with no skilled OT need at this time. OT to complete orders.      Recommendations for follow up therapy are one component of a multi-disciplinary discharge planning process, led by the attending physician.  Recommendations may be updated based on patient status, additional functional criteria and insurance authorization.   Assistance Recommended at Discharge Intermittent Supervision/Assistance     Functional Status Assessment  Patient has not had a recent decline in their functional status  Equipment Recommendations  None recommended by OT       Precautions / Restrictions Precautions Precautions: Fall Restrictions Weight Bearing Restrictions: No      Mobility Bed Mobility Overal bed mobility: Needs Assistance Bed Mobility:  Supine to Sit, Sit to Supine, Rolling Rolling: Min guard   Supine to sit: Min assist Sit to supine: Min assist        Transfers Overall transfer level: Needs assistance Equipment used: None Transfers: Sit to/from Stand Sit to Stand: Supervision                  Balance Overall balance assessment: Mild deficits observed, not formally tested                                         ADL either performed or assessed with clinical judgement   ADL Overall ADL's : Needs assistance/impaired                                       General ADL Comments: supervision for simulated transfer     Vision Patient Visual Report: No change from baseline              Pertinent Vitals/Pain Pain Assessment Pain Assessment: No/denies pain     Hand Dominance     Extremity/Trunk Assessment Upper Extremity Assessment Upper Extremity Assessment: Overall WFL for tasks assessed   Lower Extremity Assessment Lower Extremity Assessment: Defer to PT evaluation       Communication Communication Communication: HOH   Cognition Arousal/Alertness: Awake/alert Behavior During Therapy: WFL for tasks assessed/performed Overall Cognitive Status: History of cognitive impairments - at baseline  General Comments: hx of dementia. Pt did sit on EOB to attempt having BM in bed instead of allowing therapist to assist him to toilet.                Home Living Family/patient expects to be discharged to:: Private residence Living Arrangements: Spouse/significant other Available Help at Discharge: Available PRN/intermittently;Family Type of Home: House       Home Layout: Two level     Bathroom Shower/Tub: Estate manager/land agent Accessibility: Yes   Home Equipment: Agricultural consultant (2 wheels);Cane - single point;Shower seat   Additional Comments: Patient has a stair lift at home to reach the second  floor that he does use.      Prior Functioning/Environment Prior Level of Function : Needs assist;History of Falls (last six months)                                 OT Goals(Current goals can be found in the care plan section) Acute Rehab OT Goals Patient Stated Goal: to return home OT Goal Formulation: With patient/family Time For Goal Achievement: 06/15/22 Potential to Achieve Goals: Fair  OT Frequency:         AM-PAC OT "6 Clicks" Daily Activity     Outcome Measure Help from another person eating meals?: None Help from another person taking care of personal grooming?: None Help from another person toileting, which includes using toliet, bedpan, or urinal?: None Help from another person bathing (including washing, rinsing, drying)?: None Help from another person to put on and taking off regular upper body clothing?: None Help from another person to put on and taking off regular lower body clothing?: None 6 Click Score: 24   End of Session Nurse Communication: Mobility status;Other (comment) (Pt on bed pan having BM in bed)  Activity Tolerance: Patient tolerated treatment well Patient left: in bed;with call bell/phone within reach;with family/visitor present                   Time: 4403-4742 OT Time Calculation (min): 20 min Charges:  OT General Charges $OT Visit: 1 Visit OT Evaluation $OT Eval Moderate Complexity: 1 Mod OT Treatments $Self Care/Home Management : 8-22 mins  Jackquline Denmark, MS, OTR/L , CBIS ascom [phone]  06/01/22, 12:45 PM

## 2022-06-01 NOTE — Progress Notes (Signed)
CODE SEPSIS - PHARMACY COMMUNICATION  **Broad Spectrum Antibiotics should be administered within 1 hour of Sepsis diagnosis**  Time Code Sepsis Called/Page Received: 4098  Antibiotics Ordered: Cefepime, Vancomycin, Flagyl  Time of 1st antibiotic administration: 0408  Otelia Sergeant, PharmD, [facility] 06/01/2022 3:13 AM

## 2022-06-01 NOTE — ED Notes (Signed)
Patient noted to have intact red spot on left to mid buttocks, this RN applied a mepilex over site and placed patient in hospital bed.

## 2022-06-02 ENCOUNTER — Encounter: Payer: Self-pay | Admitting: Internal Medicine

## 2022-06-02 DIAGNOSIS — K529 Noninfective gastroenteritis and colitis, unspecified: Secondary | ICD-10-CM

## 2022-06-02 DIAGNOSIS — E876 Hypokalemia: Secondary | ICD-10-CM | POA: Insufficient documentation

## 2022-06-02 DIAGNOSIS — E871 Hypo-osmolality and hyponatremia: Secondary | ICD-10-CM | POA: Insufficient documentation

## 2022-06-02 DIAGNOSIS — D518 Other vitamin B12 deficiency anemias: Secondary | ICD-10-CM | POA: Diagnosis not present

## 2022-06-02 DIAGNOSIS — E669 Obesity, unspecified: Secondary | ICD-10-CM | POA: Insufficient documentation

## 2022-06-02 DIAGNOSIS — E872 Acidosis, unspecified: Secondary | ICD-10-CM | POA: Insufficient documentation

## 2022-06-02 DIAGNOSIS — D519 Vitamin B12 deficiency anemia, unspecified: Secondary | ICD-10-CM | POA: Insufficient documentation

## 2022-06-02 DIAGNOSIS — R0902 Hypoxemia: Secondary | ICD-10-CM | POA: Diagnosis not present

## 2022-06-02 DIAGNOSIS — R6510 Systemic inflammatory response syndrome (SIRS) of non-infectious origin without acute organ dysfunction: Secondary | ICD-10-CM | POA: Diagnosis not present

## 2022-06-02 LAB — GASTROINTESTINAL PANEL BY PCR, STOOL (REPLACES STOOL CULTURE)

## 2022-06-02 LAB — C DIFFICILE QUICK SCREEN W PCR REFLEX
C Diff antigen: NEGATIVE
C Diff interpretation: NOT DETECTED
C Diff toxin: NEGATIVE

## 2022-06-02 LAB — CBC
HCT: 31 % — ABNORMAL LOW (ref 39.0–52.0)
Hemoglobin: 9.5 g/dL — ABNORMAL LOW (ref 13.0–17.0)
MCH: 25.6 pg — ABNORMAL LOW (ref 26.0–34.0)
MCHC: 30.6 g/dL (ref 30.0–36.0)
MCV: 83.6 fL (ref 80.0–100.0)
Platelets: 68 10*3/uL — ABNORMAL LOW (ref 150–400)
RBC: 3.71 MIL/uL — ABNORMAL LOW (ref 4.22–5.81)
RDW: 20.3 % — ABNORMAL HIGH (ref 11.5–15.5)
WBC: 11.6 10*3/uL — ABNORMAL HIGH (ref 4.0–10.5)
nRBC: 0 % (ref 0.0–0.2)

## 2022-06-02 LAB — MAGNESIUM: Magnesium: 1.9 mg/dL (ref 1.7–2.4)

## 2022-06-02 LAB — BASIC METABOLIC PANEL
Anion gap: 6 (ref 5–15)
BUN: 29 mg/dL — ABNORMAL HIGH (ref 8–23)
CO2: 21 mmol/L — ABNORMAL LOW (ref 22–32)
Calcium: 7.6 mg/dL — ABNORMAL LOW (ref 8.9–10.3)
Chloride: 106 mmol/L (ref 98–111)
Creatinine, Ser: 1.16 mg/dL (ref 0.61–1.24)
GFR, Estimated: >60 mL/min (ref >60)
Glucose, Bld: 117 mg/dL — ABNORMAL HIGH (ref 70–99)
Potassium: 3.5 mmol/L (ref 3.5–5.1)
Sodium: 133 mmol/L — ABNORMAL LOW (ref 135–145)

## 2022-06-02 LAB — CULTURE, BLOOD (ROUTINE X 2): Culture: NO GROWTH

## 2022-06-02 LAB — FOLATE: Folate: 11.9 ng/mL (ref >5.9)

## 2022-06-02 MED ORDER — POTASSIUM CHLORIDE CRYS ER 20 MEQ PO TBCR
40.0000 meq | EXTENDED_RELEASE_TABLET | Freq: Once | ORAL | Status: AC
  Administered 2022-06-02: 40 meq via ORAL
  Filled 2022-06-02: qty 2

## 2022-06-02 MED ORDER — CYANOCOBALAMIN 1000 MCG/ML IJ SOLN
1000.0000 ug | Freq: Every day | INTRAMUSCULAR | Status: DC
  Administered 2022-06-03 – 2022-06-04 (×2): 1000 ug via INTRAMUSCULAR
  Filled 2022-06-02 (×2): qty 1

## 2022-06-02 MED ORDER — SODIUM BICARBONATE 650 MG PO TABS
650.0000 mg | ORAL_TABLET | Freq: Two times a day (BID) | ORAL | Status: DC
  Administered 2022-06-02 – 2022-06-04 (×5): 650 mg via ORAL
  Filled 2022-06-02 (×5): qty 1

## 2022-06-02 MED ORDER — POTASSIUM CHLORIDE IN NACL 20-0.9 MEQ/L-% IV SOLN
INTRAVENOUS | Status: DC
  Filled 2022-06-02 (×3): qty 1000

## 2022-06-02 MED ORDER — METRONIDAZOLE 500 MG PO TABS
500.0000 mg | ORAL_TABLET | Freq: Two times a day (BID) | ORAL | Status: DC
  Administered 2022-06-02 – 2022-06-04 (×5): 500 mg via ORAL
  Filled 2022-06-02 (×5): qty 1

## 2022-06-02 MED ORDER — CYANOCOBALAMIN 1000 MCG/ML IJ SOLN
1000.0000 ug | Freq: Once | INTRAMUSCULAR | Status: AC
  Administered 2022-06-02: 1000 ug via INTRAMUSCULAR
  Filled 2022-06-02: qty 1

## 2022-06-02 NOTE — Hospital Course (Signed)
Alex Gomez is a 77 y.o. male with medical history significant for Multiple myeloma, HTN, CAD, AVS status post bioprosthetic AVR, bilateral carotid stenosis, HLD, chronic tremor, dementia, who has been declining functionally for the last few months who was brought to the ED after he sustained 2 falls on the day of arrival without any injury.  Patient wife has been having diarrhea for 4 days, patient started have diarrhea for 2 days with abdominal cramps.  But no nausea vomiting.  Upon arriving the hospital, patient temperature was 103. He had tachypnea, significant cytosis.  Lactic acid was 2.6.  He is placed on Zosyn after blood culture sent out.  Also started on IV fluids. Stool study came back with rotavirus 6/1, antibiotic changed to oral Flagyl.  Continued on IV fluids.

## 2022-06-02 NOTE — ED Notes (Signed)
Wife complaining about long wait for IP bed. Attempted to console wife.

## 2022-06-02 NOTE — ED Notes (Signed)
Pt awake, will give flagyl now.

## 2022-06-02 NOTE — Plan of Care (Signed)

## 2022-06-02 NOTE — Progress Notes (Addendum)
Progress Note   Patient: Alex Gomez:[id number] DOB: 03-May-1945 DOA: 05/31/2022     1 DOS: the patient was seen and examined on 06/02/2022   Brief hospital course: Alex Gomez is a 77 y.o. male with medical history significant for Multiple myeloma, HTN, CAD, AVS status post bioprosthetic AVR, bilateral carotid stenosis, HLD, chronic tremor, dementia, who has been declining functionally for the last few months who was brought to the ED after he sustained 2 falls on the day of arrival without any injury.  Patient wife has been having diarrhea for 4 days, patient started have diarrhea for 2 days with abdominal cramps.  But no nausea vomiting.  Upon arriving the hospital, patient temperature was 103. He had tachypnea, significant cytosis.  Lactic acid was 2.6.  He is placed on Zosyn after blood culture sent out.  Also started on IV fluids.   Principal Problem:   Hypoxia Active Problems:   SIRS (systemic inflammatory response syndrome) (HCC)   Acute metabolic encephalopathy   Frequent falls   Thrombocytopenia (HCC)   Multiple myeloma (HCC)   Dementia without behavioral disturbance (HCC)   Benign essential hypertension   Bilateral carotid artery stenosis   CAD (coronary artery disease)   Tremor   Hyponatremia   Hypokalemia   B12 deficiency anemia   Metabolic acidosis   Gastroenteritis   Assessment and Plan: * Hypoxia Patient hypoxic to 88 in the ED but CTA chest unrevealing, negative for PE, showing atelectasis COVID-negative No evidence of congestive heart failure exacerbation.  No evidence of COPD exacerbation. Patient also does not complains any shortness of breath, currently patient is 95% on room air.  Acute metabolic encephalopathy Sepsis secondary to gastroenteritis  Gastroenteritis. Frequent falls/increased tremors Patient met sepsis criteria with high fever, tachypnea, leukocytosis.  Mild elevation of procalcitonin level, lactic acid level 2.6.  Condition appears to  be secondary to gastroenteritis.  Patient still has significant diarrhea, C. difficile toxin negative, GI pathogen panel sent out pending results.  Given sick contact, patient most likely has viral versus bacterial gastroenteritis. Patient still appears to have some dehydration, continue IV fluids. Currently patient is on Zosyn, will change antibiotics to target GI pathogen once identified.  Hyponatremia Hypokalemia Metabolic acidosis. This appears to be secondary to dehydration from diarrhea.  Potassium better today, will give additional 40 mEq of KCl orally, add potassium into IV fluids. Sodium level is better today after fluids. Also added sodium bicarb orally for mild metabolic acidosis.  Multiple myeloma (HCC) Thrombocytopenia Vitamin B12 deficient anemia. Patient multiple myeloma was not in remission.  Patient has significant immune deficiency.  Will follow closely. Will give vitamin B12 injection.  Dementia without behavioral disturbance (HCC) Depression/insomnia Chronic tremors History of PTSD Resume home medicines.   CAD (coronary artery disease) Continue aspirin, atorvastatin and metoprolol  Benign essential hypertension Continue metoprolol  Obesity with BMI 30.34. Diet and exercise.  Addendum: 1400  Stool positive for rotavirus. Discontinue zosyn, treat with 3 days of flagyl.    Subjective:  Patient still has multiple diarrhea this morning, no nausea vomiting.  Abdominal pain is better. No additional fever.  Physical Exam: Vitals:   06/02/22 0941 06/02/22 1000 06/02/22 1030 06/02/22 1100  BP: 133/68 127/66 115/63 108/62  Pulse: 96 97 84 79  Resp:  (!) 21 (!) 23 (!) 22  Temp:      TempSrc:      SpO2:  93% 90% 90%  Weight:      Height:  General exam: Appears calm and comfortable  Respiratory system: Clear to auscultation. Respiratory effort normal. Cardiovascular system: S1 & S2 heard, RRR. No JVD, murmurs, rubs, gallops or clicks. No pedal  edema. Gastrointestinal system: Abdomen is mildly distended, soft and nontender. No organomegaly or masses felt. Normal bowel sounds heard. Central nervous system: Alert and oriented x3. No focal neurological deficits. Extremities: Symmetric 5 x 5 power. Skin: No rashes, lesions or ulcers Psychiatry: Mood & affect appropriate.    Data Reviewed:  Reviewed the CT scan results, lab results.  Family Communication: Wife updated at bedside.  Disposition: Status is: Inpatient Remains inpatient appropriate because: Severity of disease, IV treatment. PT obtained, may need nursing home placement.     Time spent: 55 minutes  Author: Marrion Coy, MD 06/02/2022 11:05 AM  For on call review [email or web address].

## 2022-06-02 NOTE — ED Notes (Signed)
Called floor for RN to be assigned to room. They will assign RN and turn green.

## 2022-06-02 NOTE — Consult Note (Signed)
PHARMACY CONSULT NOTE  Pharmacy Consult for Electrolyte Monitoring and Replacement   Recent Labs: Potassium (mmol/L)  Date Value  06/02/2022 3.5   Magnesium (mg/dL)  Date Value  16/10/9602 1.9   Calcium (mg/dL)  Date Value  54/09/8117 7.6 (L)   Albumin (g/dL)  Date Value  14/78/2956 2.8 (L)   Phosphorus (mg/dL)  Date Value  21/30/8657 3.4   Sodium (mmol/L)  Date Value  06/02/2022 133 (L)   Corrected Ca 8.6 mcg/mL  Assessment: 77 y.o. male with medical history significant for Multiple myeloma, HTN, CAD, AVS status post bioprosthetic AVR, bilateral carotid stenosis, HLD, chronic tremor, dementia, who has been declining functionally for the last few months who was brought to the ED after he sustained 2 falls on the day of arrival without any injury, and concern for an infection.   Fluids: 0.9 % NaCl with KCl 20 mEq/ L   at 75 mL.hr  Goal of Therapy:  Electrolytes WNL  Plan:  ---no electrolyte replacement warranted for today: fluids per MD ---recheck electrolytes in am    Lowella Bandy ,PharmD Clinical Pharmacist 06/02/2022 2:10 PM

## 2022-06-03 DIAGNOSIS — R0902 Hypoxemia: Secondary | ICD-10-CM | POA: Diagnosis not present

## 2022-06-03 DIAGNOSIS — D509 Iron deficiency anemia, unspecified: Secondary | ICD-10-CM | POA: Insufficient documentation

## 2022-06-03 DIAGNOSIS — A080 Rotaviral enteritis: Secondary | ICD-10-CM | POA: Insufficient documentation

## 2022-06-03 DIAGNOSIS — G9341 Metabolic encephalopathy: Secondary | ICD-10-CM | POA: Diagnosis not present

## 2022-06-03 DIAGNOSIS — K529 Noninfective gastroenteritis and colitis, unspecified: Secondary | ICD-10-CM | POA: Diagnosis not present

## 2022-06-03 LAB — FERRITIN: Ferritin: 134 ng/mL (ref 24–336)

## 2022-06-03 LAB — BASIC METABOLIC PANEL
Anion gap: 3 — ABNORMAL LOW (ref 5–15)
BUN: 22 mg/dL (ref 8–23)
CO2: 24 mmol/L (ref 22–32)
Calcium: 7.4 mg/dL — ABNORMAL LOW (ref 8.9–10.3)
Chloride: 109 mmol/L (ref 98–111)
Creatinine, Ser: 0.78 mg/dL (ref 0.61–1.24)
GFR, Estimated: >60 mL/min (ref >60)
Glucose, Bld: 96 mg/dL (ref 70–99)
Potassium: 4 mmol/L (ref 3.5–5.1)
Sodium: 136 mmol/L (ref 135–145)

## 2022-06-03 LAB — CBC
HCT: 28.4 % — ABNORMAL LOW (ref 39.0–52.0)
Hemoglobin: 8.7 g/dL — ABNORMAL LOW (ref 13.0–17.0)
MCH: 25.2 pg — ABNORMAL LOW (ref 26.0–34.0)
MCHC: 30.6 g/dL (ref 30.0–36.0)
MCV: 82.3 fL (ref 80.0–100.0)
Platelets: 56 10*3/uL — ABNORMAL LOW (ref 150–400)
RBC: 3.45 MIL/uL — ABNORMAL LOW (ref 4.22–5.81)
RDW: 19.9 % — ABNORMAL HIGH (ref 11.5–15.5)
WBC: 5.9 10*3/uL (ref 4.0–10.5)
nRBC: 0 % (ref 0.0–0.2)

## 2022-06-03 LAB — IRON AND TIBC
Iron: 19 ug/dL — ABNORMAL LOW (ref 45–182)
Saturation Ratios: 13 % — ABNORMAL LOW (ref 17.9–39.5)
TIBC: 148 ug/dL — ABNORMAL LOW (ref 250–450)
UIBC: 129 ug/dL

## 2022-06-03 LAB — MAGNESIUM: Magnesium: 2.1 mg/dL (ref 1.7–2.4)

## 2022-06-03 LAB — CULTURE, BLOOD (ROUTINE X 2): Special Requests: ADEQUATE

## 2022-06-03 LAB — PHOSPHORUS: Phosphorus: 3 mg/dL (ref 2.5–4.6)

## 2022-06-03 MED ORDER — PRAZOSIN HCL 5 MG PO CAPS
10.0000 mg | ORAL_CAPSULE | Freq: Every day | ORAL | Status: DC
  Administered 2022-06-03 (×2): 10 mg via ORAL
  Filled 2022-06-03 (×2): qty 2

## 2022-06-03 MED ORDER — SODIUM CHLORIDE 0.9 % IV SOLN
300.0000 mg | Freq: Once | INTRAVENOUS | Status: AC
  Administered 2022-06-03: 300 mg via INTRAVENOUS
  Filled 2022-06-03: qty 300

## 2022-06-03 NOTE — Plan of Care (Signed)
  Problem: Fluid Volume: Goal: Hemodynamic stability will improve Outcome: Progressing   Problem: Clinical Measurements: Goal: Diagnostic test results will improve Outcome: Progressing Goal: Signs and symptoms of infection will decrease Outcome: Progressing   Problem: Respiratory: Goal: Ability to maintain adequate ventilation will improve Outcome: Progressing   Problem: Education: Goal: Knowledge of General Education information will improve Description: Including pain rating scale, medication(s)/side effects and non-pharmacologic comfort measures Outcome: Progressing   Problem: Health Behavior/Discharge Planning: Goal: Ability to manage health-related needs will improve Outcome: Progressing   Problem: Activity: Goal: Risk for activity intolerance will decrease Outcome: Progressing   Problem: Nutrition: Goal: Adequate nutrition will be maintained Outcome: Progressing   Problem: Safety: Goal: Ability to remain free from injury will improve Outcome: Progressing   Problem: Pain Managment: Goal: General experience of comfort will improve Outcome: Progressing   Problem: Skin Integrity: Goal: Risk for impaired skin integrity will decrease Outcome: Progressing

## 2022-06-03 NOTE — Consult Note (Signed)
PHARMACY CONSULT NOTE  Pharmacy Consult for Electrolyte Monitoring and Replacement   Recent Labs: Potassium (mmol/L)  Date Value  06/03/2022 4.0   Magnesium (mg/dL)  Date Value  16/10/9602 2.1   Calcium (mg/dL)  Date Value  54/09/8117 7.4 (L)   Albumin (g/dL)  Date Value  14/78/2956 2.8 (L)   Phosphorus (mg/dL)  Date Value  21/30/8657 3.0   Sodium (mmol/L)  Date Value  06/03/2022 136   Corrected Ca 8.6 mcg/mL  Assessment: 77 y.o. male with medical history significant for Multiple myeloma, HTN, CAD, AVS status post bioprosthetic AVR, bilateral carotid stenosis, HLD, chronic tremor, dementia, who has been declining functionally for the last few months who was brought to the ED after he sustained 2 falls on the day of arrival without any injury, and concern for an infection.   Fluids:none  Medications:  Sodium bicarb 650 mg BID  Ca 7.4  Albumin 2.8  Corrected Ca: 8.4  Goal of Therapy:  Electrolytes WNL  Plan:  ---no electrolyte replacement warranted for today ---recheck electrolytes in am    Angelique Blonder ,PharmD Clinical Pharmacist 06/03/2022 8:08 AM

## 2022-06-03 NOTE — Plan of Care (Signed)
  Problem: Fluid Volume: Goal: Hemodynamic stability will improve 06/03/2022 0454 by Marrian Salvage, RN Outcome: Progressing 06/03/2022 0620 by Marrian Salvage, RN Outcome: Progressing   Problem: Clinical Measurements: Goal: Diagnostic test results will improve 06/03/2022 0623 by Marrian Salvage, RN Outcome: Progressing 06/03/2022 0620 by Marrian Salvage, RN Outcome: Progressing Goal: Signs and symptoms of infection will decrease 06/03/2022 0981 by Marrian Salvage, RN Outcome: Progressing 06/03/2022 0620 by Marrian Salvage, RN Outcome: Progressing   Problem: Respiratory: Goal: Ability to maintain adequate ventilation will improve Outcome: Progressing   Problem: Education: Goal: Knowledge of General Education information will improve Description: Including pain rating scale, medication(s)/side effects and non-pharmacologic comfort measures 06/03/2022 0623 by Marrian Salvage, RN Outcome: Progressing 06/03/2022 0620 by Marrian Salvage, RN Outcome: Progressing   Problem: Health Behavior/Discharge Planning: Goal: Ability to manage health-related needs will improve 06/03/2022 0623 by Marrian Salvage, RN Outcome: Progressing 06/03/2022 0620 by Marrian Salvage, RN Outcome: Progressing   Problem: Clinical Measurements: Goal: Ability to maintain clinical measurements within normal limits will improve Outcome: Progressing Goal: Will remain free from infection Outcome: Progressing Goal: Diagnostic test results will improve Outcome: Progressing Goal: Respiratory complications will improve Outcome: Progressing Goal: Cardiovascular complication will be avoided Outcome: Progressing   Problem: Activity: Goal: Risk for activity intolerance will decrease Outcome: Progressing   Problem: Nutrition: Goal: Adequate nutrition will be maintained Outcome: Progressing   Problem: Coping: Goal: Level of anxiety will decrease Outcome: Progressing   Problem: Elimination: Goal: Will not experience  complications related to bowel motility Outcome: Progressing Goal: Will not experience complications related to urinary retention Outcome: Progressing   Problem: Pain Managment: Goal: General experience of comfort will improve Outcome: Progressing   Problem: Safety: Goal: Ability to remain free from injury will improve Outcome: Progressing   Problem: Skin Integrity: Goal: Risk for impaired skin integrity will decrease Outcome: Progressing

## 2022-06-03 NOTE — Progress Notes (Signed)
Progress Note   Patient: Alex Gomez GNF:[id number] DOB: 25-Oct-1945 DOA: 05/31/2022     2 DOS: the patient was seen and examined on 06/03/2022   Brief hospital course: Alex Gomez is a 77 y.o. male with medical history significant for Multiple myeloma, HTN, CAD, AVS status post bioprosthetic AVR, bilateral carotid stenosis, HLD, chronic tremor, dementia, who has been declining functionally for the last few months who was brought to the ED after he sustained 2 falls on the day of arrival without any injury.  Patient wife has been having diarrhea for 4 days, patient started have diarrhea for 2 days with abdominal cramps.  But no nausea vomiting.  Upon arriving the hospital, patient temperature was 103. He had tachypnea, significant cytosis.  Lactic acid was 2.6.  He is placed on Zosyn after blood culture sent out.  Also started on IV fluids. Stool study came back with rotavirus 6/1, antibiotic changed to oral Flagyl.  Continued on IV fluids.   Principal Problem:   Hypoxia Active Problems:   SIRS (systemic inflammatory response syndrome) (HCC)   Acute metabolic encephalopathy   Frequent falls   Thrombocytopenia (HCC)   Multiple myeloma (HCC)   Dementia without behavioral disturbance (HCC)   Benign essential hypertension   Bilateral carotid artery stenosis   CAD (coronary artery disease)   Tremor   Hyponatremia   Hypokalemia   B12 deficiency anemia   Metabolic acidosis   Gastroenteritis   Obesity (BMI 30-39.9)   Iron deficiency anemia   Assessment and Plan:   Hypoxia Patient hypoxic to 88 in the ED but CTA chest unrevealing, negative for PE, showing atelectasis COVID-negative No evidence of congestive heart failure exacerbation.  No evidence of COPD exacerbation. Condition resolved.   Acute metabolic encephalopathy Sepsis secondary to gastroenteritis  Gastroenteritis secondary to rotavirus. Frequent falls/increased tremors Patient met sepsis criteria with high fever,  tachypnea, leukocytosis.  Mild elevation of procalcitonin level, lactic acid level 2.6.  Condition appears to be secondary to gastroenteritis.  Patient still has significant diarrhea, C. difficile toxin negative, stool pathogen positive for rotavirus. Patient was given IV fluids, diarrhea is getting better today.  Zosyn was discontinued, changed to Flagyl for 3 days. Condition has been improving, may be able to discharge home tomorrow after PT.   Hyponatremia Hypokalemia Metabolic acidosis. This appears to be secondary to dehydration from diarrhea.   Condition all improved after giving fluids.   Multiple myeloma (HCC) Thrombocytopenia Vitamin B12 deficient anemia. Iron deficient anemia. Patient multiple myeloma was not in remission.  Patient has significant immune deficiency.   Patient also has significant B12 and iron deficiency, these are mostly caused by malabsorption. Will give 4 days of B12 injection followed by oral.  Will also give a dose of IV iron.  Patient be followed with hematology/oncology for future infusion.   Dementia without behavioral disturbance (HCC) Depression/insomnia Chronic tremors History of PTSD Resume home medicines.     CAD (coronary artery disease) Continue aspirin, atorvastatin and metoprolol   Benign essential hypertension Continue metoprolol   Obesity with BMI 30.34. Diet and exercise.     Subjective:  Patient doing better today, no confusion.  Physical Exam: Vitals:   06/02/22 2203 06/03/22 0057 06/03/22 0436 06/03/22 0733  BP:  115/65 (!) 114/59 124/61  Pulse:  65 72 70  Resp:   12 16  Temp:  98.2 F (36.8 C) (!) 97.4 F (36.3 C) 97.6 F (36.4 C)  TempSrc:  Oral Oral   SpO2: 94% 90%  93% 95%  Weight:      Height:       General exam: Appears calm and comfortable  Respiratory system: Clear to auscultation. Respiratory effort normal. Cardiovascular system: S1 & S2 heard, RRR. No JVD, murmurs, rubs, gallops or clicks. No pedal  edema. Gastrointestinal system: Abdomen is nondistended, soft and nontender. No organomegaly or masses felt. Normal bowel sounds heard. Central nervous system: Alert and oriented x3. No focal neurological deficits. Extremities: Symmetric 5 x 5 power. Skin: No rashes, lesions or ulcers Psychiatry: Judgement and insight appear normal. Mood & affect appropriate.    Data Reviewed:  Lab results reviewed.  Family Communication: None  Disposition: Status is: Inpatient Remains inpatient appropriate because: Severity of disease,     Time spent: 35 minutes  Author: Marrion Coy, MD 06/03/2022 11:06 AM  For on call review [email or web address].

## 2022-06-04 DIAGNOSIS — D508 Other iron deficiency anemias: Secondary | ICD-10-CM | POA: Diagnosis not present

## 2022-06-04 DIAGNOSIS — R0902 Hypoxemia: Secondary | ICD-10-CM | POA: Diagnosis not present

## 2022-06-04 DIAGNOSIS — A080 Rotaviral enteritis: Secondary | ICD-10-CM | POA: Diagnosis not present

## 2022-06-04 LAB — CBC
HCT: 30.9 % — ABNORMAL LOW (ref 39.0–52.0)
Hemoglobin: 9.4 g/dL — ABNORMAL LOW (ref 13.0–17.0)
MCH: 25.3 pg — ABNORMAL LOW (ref 26.0–34.0)
MCHC: 30.4 g/dL (ref 30.0–36.0)
MCV: 83.3 fL (ref 80.0–100.0)
Platelets: 55 10*3/uL — ABNORMAL LOW (ref 150–400)
RBC: 3.71 MIL/uL — ABNORMAL LOW (ref 4.22–5.81)
RDW: 19.4 % — ABNORMAL HIGH (ref 11.5–15.5)
WBC: 4.4 10*3/uL (ref 4.0–10.5)
nRBC: 0 % (ref 0.0–0.2)

## 2022-06-04 LAB — BASIC METABOLIC PANEL
Anion gap: 3 — ABNORMAL LOW (ref 5–15)
BUN: 22 mg/dL (ref 8–23)
CO2: 26 mmol/L (ref 22–32)
Calcium: 7.8 mg/dL — ABNORMAL LOW (ref 8.9–10.3)
Chloride: 107 mmol/L (ref 98–111)
Creatinine, Ser: 0.73 mg/dL (ref 0.61–1.24)
GFR, Estimated: >60 mL/min (ref >60)
Glucose, Bld: 97 mg/dL (ref 70–99)
Potassium: 3.8 mmol/L (ref 3.5–5.1)
Sodium: 136 mmol/L (ref 135–145)

## 2022-06-04 LAB — CULTURE, BLOOD (ROUTINE X 2): Culture: NO GROWTH

## 2022-06-04 LAB — MAGNESIUM: Magnesium: 1.9 mg/dL (ref 1.7–2.4)

## 2022-06-04 MED ORDER — VITAMIN B-12 1000 MCG PO TABS: 1000.0000 ug | ORAL_TABLET | Freq: Every day | ORAL | 0 refills | Status: DC

## 2022-06-04 MED ORDER — FERROUS SULFATE 325 (65 FE) MG PO TBEC: 325.0000 mg | DELAYED_RELEASE_TABLET | Freq: Every day | ORAL | 0 refills | Status: AC

## 2022-06-04 NOTE — Progress Notes (Signed)
Physical Therapy Treatment Patient Details Name: Alex Gomez MRN: [id number] DOB: Sep 25, 1945 Today's Date: 06/04/2022   History of Present Illness Patient is a 77 y.o male admitted to the ED status post 2 falls at homedue to self reported weakness. Upon admission was diagnosed with encephalopathy. Co-morbidities inclyde hypoxia, HTN, coronary artery stenosis, CAD, dementia, multiple myeloma, tremors and SIRS.    PT Comments    Pt seen for PT tx with pt agreeable. Pt soiled with urine 2/2 purewick malfunction so PT assisted pt with donning clean gown, pt donned clean brief with wife assistance. Pt is able to transfer STS with CGA fade to supervision & ambulate around room & hallway without AD with CGA fade to supervision. Pt negotiates 6 steps with R rail with CGA<>supervision, 1 mild LOB 2/2 shoe catching on step with min assist to correct. Wife reports pt is mobilizing fairly close to his baseline. Recommend ongoing PT services to address balance, endurance, gait & stair negotiation.    Recommendations for follow up therapy are one component of a multi-disciplinary discharge planning process, led by the attending physician.  Recommendations may be updated based on patient status, additional functional criteria and insurance authorization.  Follow Up Recommendations       Assistance Recommended at Discharge Intermittent Supervision/Assistance  Patient can return home with the following A little help with walking and/or transfers;A little help with bathing/dressing/bathroom;Help with stairs or ramp for entrance   Equipment Recommendations  Rolling walker (2 wheels)    Recommendations for Other Services       Precautions / Restrictions Precautions Precautions: Fall Restrictions Weight Bearing Restrictions: No     Mobility  Bed Mobility Overal bed mobility: Needs Assistance Bed Mobility: Supine to Sit     Supine to sit: Supervision, HOB elevated (use of bed rails, HOB  elevated)          Transfers Overall transfer level: Needs assistance Equipment used: None Transfers: Sit to/from Stand Sit to Stand: Supervision, Min guard           General transfer comment: STS from EOB with CGA fade to supervision STS from standard chair in room    Ambulation/Gait Ambulation/Gait assistance: Min guard, Supervision (CGA quickly fade to supervision) Gait Distance (Feet): 80 Feet Assistive device: None Gait Pattern/deviations: Decreased stance time - left, Decreased step length - right, Decreased step length - left Gait velocity: decreased         Stairs Stairs: Yes Stairs assistance: Min guard, Supervision Stair Management: One rail Right, Alternating pattern, Step to pattern Number of Stairs: 6 (6") General stair comments: Pt ascends 6 steps with R rail with CGA, descends steps with L rail with CGA fade to close supervision. Pt with 1 LOB 2/2 shoe catching on step with min assist to correct.   Wheelchair Mobility    Modified Rankin (Stroke Patients Only)       Balance Overall balance assessment: Needs assistance Sitting-balance support: Feet supported Sitting balance-Leahy Scale: Good     Standing balance support: During functional activity, No upper extremity supported Standing balance-Leahy Scale: Fair                              Cognition Arousal/Alertness: Awake/alert Behavior During Therapy: WFL for tasks assessed/performed Overall Cognitive Status: History of cognitive impairments - at baseline  General Comments: per chart, pt with hx of dementia, voices being anxious & states various things are going to make him anxious, very HOH even with hearing aides in, follows simple commands inconsistently with extra time.        Exercises      General Comments General comments (skin integrity, edema, etc.): Pt received on 2L/min, weaned to room air, lowest SPO2 on room air  91%.      Pertinent Vitals/Pain Pain Assessment Pain Assessment: No/denies pain    Home Living                          Prior Function            PT Goals (current goals can now be found in the care plan section) Acute Rehab PT Goals Patient Stated Goal: To go home PT Goal Formulation: With patient/family Time For Goal Achievement: 06/15/22 Potential to Achieve Goals: Good Progress towards PT goals: Progressing toward goals    Frequency    Min 2X/week      PT Plan Current plan remains appropriate    Co-evaluation              AM-PAC PT "6 Clicks" Mobility   Outcome Measure  Help needed turning from your back to your side while in a flat bed without using bedrails?: None Help needed moving from lying on your back to sitting on the side of a flat bed without using bedrails?: None Help needed moving to and from a bed to a chair (including a wheelchair)?: A Little Help needed standing up from a chair using your arms (e.g., wheelchair or bedside chair)?: A Little Help needed to walk in hospital room?: A Little Help needed climbing 3-5 steps with a railing? : A Little 6 Click Score: 20    End of Session   Activity Tolerance: Patient tolerated treatment well Patient left: in chair;with call bell/phone within reach;with family/visitor present Nurse Communication: Mobility status (need for bath 2/2 purewick malfunction, O2) PT Visit Diagnosis: Unsteadiness on feet (R26.81);Other abnormalities of gait and mobility (R26.89);Repeated falls (R29.6);Muscle weakness (generalized) (M62.81);History of falling (Z91.81)     Time: 1610-9604 PT Time Calculation (min) (ACUTE ONLY): 19 min  Charges:  $Therapeutic Activity: 8-22 mins                     [name], PT, DPT 06/04/22, 1:34 PM  [name] 06/04/2022, 1:32 PM

## 2022-06-04 NOTE — Care Management Important Message (Signed)
Important Message  Patient Details  Name: Alex Gomez MRN: [id number] Date of Birth: Oct 03, 1945   Medicare Important Message Given:  N/A - LOS <3 / Initial given by admissions     Olegario Messier A Trenace Coughlin 06/04/2022, 10:03 AM

## 2022-06-04 NOTE — TOC Transition Note (Signed)
Transition of Care [facility]) - CM/SW Discharge Note   Patient Details  Name: Alex Gomez MRN: [id number] Date of Birth: 26-Aug-1945  Transition of Care [facility]) CM/SW Contact:  Margarito Liner, LCSW Phone Number: 06/04/2022, 10:25 AM   Clinical Narrative:  Patient has orders to discharge home today. Left message for [facility] liaison to notify. No further concerns. CSW signing off.   Final next level of care: Home w Home Health Services Barriers to Discharge: Barriers Resolved   Patient Goals and CMS Choice      Discharge Placement                      Patient and family notified of of transfer: 06/04/22  Discharge Plan and Services Additional resources added to the After Visit Summary for                            [facility] Arranged: PT, OT [facility] Agency: [facility] Date HH Agency Contacted: 06/04/22   Representative spoke with at [facility] Agency: Feliberto Gottron  Social Determinants of Health (SDOH) Interventions SDOH Screenings   Food Insecurity: No Food Insecurity (06/01/2022)  Housing: Low Risk  (06/01/2022)  Transportation Needs: No Transportation Needs (06/01/2022)  Utilities: Not At Risk (06/01/2022)  Depression (PHQ2-9): Medium Risk (08/20/2018)  Tobacco Use: Medium Risk (06/02/2022)     Readmission Risk Interventions     No data to display

## 2022-06-04 NOTE — Consult Note (Signed)
PHARMACY CONSULT NOTE  Pharmacy Consult for Electrolyte Monitoring and Replacement   Recent Labs: Potassium (mmol/L)  Date Value  06/04/2022 3.8   Magnesium (mg/dL)  Date Value  16/10/9602 1.9   Calcium (mg/dL)  Date Value  54/09/8117 7.8 (L)   Albumin (g/dL)  Date Value  14/78/2956 2.8 (L)   Phosphorus (mg/dL)  Date Value  21/30/8657 3.0   Sodium (mmol/L)  Date Value  06/04/2022 136   Corrected Ca 8.6 mcg/mL  Assessment: 77 y.o. male with medical history significant for Multiple myeloma, HTN, CAD, AVS status post bioprosthetic AVR, bilateral carotid stenosis, HLD, chronic tremor, dementia, who has been declining functionally for the last few months who was brought to the ED after he sustained 2 falls on the day of arrival without any injury, and concern for an infection.   Medications:  Sodium bicarb 650 mg BID   Goal of Therapy:  Electrolytes WNL  Plan:  ---no electrolyte replacement warranted for today ---recheck electrolytes in am if not discharged today   Lowella Bandy ,PharmD Clinical Pharmacist 06/04/2022 7:09 AM

## 2022-06-04 NOTE — Discharge Summary (Signed)
Physician Discharge Summary   Patient: Alex Gomez MRN: [id number] DOB: [birth year]  Admit date:     05/31/2022  Discharge date: 06/04/22  Discharge Physician: Marrion Coy   PCP: [facility]   Recommendations at discharge:   Follow-up with PCP in 1 week. Follow-up with hematology in 1 month.  Discharge Diagnoses: Principal Problem:   Hypoxia Active Problems:   SIRS (systemic inflammatory response syndrome) (HCC)   Acute metabolic encephalopathy   Frequent falls   Thrombocytopenia (HCC)   Multiple myeloma (HCC)   Dementia without behavioral disturbance (HCC)   Benign essential hypertension   Bilateral carotid artery stenosis   CAD (coronary artery disease)   Tremor   Hyponatremia   Hypokalemia   B12 deficiency anemia   Metabolic acidosis   Gastroenteritis   Obesity (BMI 30-39.9)   Iron deficiency anemia   Rotaviral gastroenteritis  Resolved Problems:   * No resolved hospital problems. * Sepsis ruled in. Hospital Course: KAELYN YOUNGDAHL is a 77 y.o. male with medical history significant for Multiple myeloma, HTN, CAD, AVS status post bioprosthetic AVR, bilateral carotid stenosis, HLD, chronic tremor, dementia, who has been declining functionally for the last few months who was brought to the ED after he sustained 2 falls on the day of arrival without any injury.  Patient wife has been having diarrhea for 4 days, patient started have diarrhea for 2 days with abdominal cramps.  But no nausea vomiting.  Upon arriving the hospital, patient temperature was 103. He had tachypnea, significant cytosis.  Lactic acid was 2.6.  He is placed on Zosyn after blood culture sent out.  Also started on IV fluids. Stool study came back with rotavirus 6/1, antibiotic changed to oral Flagyl.  Continued on IV fluids. Patient condition had improved with, no longer has any diarrhea.  Tolerating diet.  Electrolytes normalized.  Medically stable to be discharged.  Assessment and Plan:   Hypoxia Patient hypoxic to 88 in the ED but CTA chest unrevealing, negative for PE, showing atelectasis COVID-negative No evidence of congestive heart failure exacerbation.  No evidence of COPD exacerbation. Condition resolved.   Acute metabolic encephalopathy Sepsis secondary to gastroenteritis  Gastroenteritis secondary to rotavirus. Frequent falls/increased tremors Patient met sepsis criteria with high fever, tachypnea, leukocytosis.  Mild elevation of procalcitonin level, lactic acid level 2.6.  Condition appears to be secondary to gastroenteritis.  Patient still has significant diarrhea, C. difficile toxin negative, stool pathogen positive for rotavirus. Patient was given IV fluids, diarrhea is getting better.  Zosyn was discontinued 6/1, changed to Flagyl. Condition improved, no longer has any diarrhea or confusion.    Hyponatremia Hypokalemia Metabolic acidosis. This appears to be secondary to dehydration from diarrhea.   Condition all improved after giving fluids.   Multiple myeloma (HCC) Thrombocytopenia Vitamin B12 deficient anemia. Iron deficient anemia. Patient multiple myeloma was not in remission.  Patient has significant immune deficiency.   Patient also has significant B12 and iron deficiency, these are mostly caused by malabsorption. Received 3 days of B12 injection followed by oral.  Received a dose of IV iron.  Patient be followed with hematology/oncology for possible future infusion.  I also prescribed oral B12 and iron.   Dementia without behavioral disturbance (HCC) Depression/insomnia Chronic tremors History of PTSD Resume home medicines.     CAD (coronary artery disease) Continue aspirin, atorvastatin and metoprolol   Benign essential hypertension Continue metoprolol   Obesity with BMI 30.34. Diet and exercise.  Consultants: None Procedures performed: None  Disposition: Home health Diet recommendation:  Discharge Diet Orders (From  admission, onward)     Start     Ordered   06/04/22 0000  Diet - low sodium heart healthy        06/04/22 1005           Cardiac diet DISCHARGE MEDICATION: Allergies as of 06/04/2022       Reactions   Shellfish Allergy Shortness Of Breath        Medication List     STOP taking these medications    Coenzyme Q10 100 MG capsule   propranolol ER 60 MG 24 hr capsule Commonly known as: INDERAL LA       TAKE these medications    allopurinol 300 MG tablet Commonly known as: ZYLOPRIM Take 300 mg by mouth daily.   aspirin EC 81 MG tablet Take 81 mg by mouth daily.   atorvastatin 40 MG tablet Commonly known as: LIPITOR Take 40 mg by mouth daily.   carboxymethylcellulose 0.5 % Soln Commonly known as: REFRESH PLUS 1 drop 3 (three) times daily as needed.   Cholecalciferol 25 MCG (1000 UT) tablet Take 1,000 Units by mouth daily.   clobetasol cream 0.05 % Commonly known as: TEMOVATE Apply 1 Application topically 2 (two) times daily.   coal tar 0.5 % shampoo Commonly known as: NEUTROGENA T-GEL Apply 1 Application topically at bedtime as needed.   colchicine 0.6 MG tablet Take 1 tablet by mouth 2 (two) times daily.   Combivent Respimat 20-100 MCG/ACT Aers respimat Generic drug: Ipratropium-Albuterol Inhale 1 puff into the lungs every 6 (six) hours as needed for wheezing or shortness of breath.   cyanocobalamin 1000 MCG tablet Commonly known as: VITAMIN B12 Take 1 tablet (1,000 mcg total) by mouth daily.   desonide 0.05 % cream Commonly known as: DESOWEN Apply 1 Application topically 2 (two) times daily.   ferrous sulfate 325 (65 FE) MG EC tablet Take 1 tablet (325 mg total) by mouth daily with breakfast.   finasteride 5 MG tablet Commonly known as: PROSCAR Take 5 mg by mouth at bedtime.   fluconazole 200 MG tablet Commonly known as: DIFLUCAN Take 200 mg by mouth once a week.   hydrOXYzine 10 MG tablet Commonly known as: ATARAX Take 10 mg by mouth  daily as needed.   ipratropium 0.06 % nasal spray Commonly known as: ATROVENT Place 2 sprays into both nostrils daily as needed for rhinitis.   ketoconazole 2 % cream Commonly known as: NIZORAL Apply 1 application topically daily. Apply 1 application to scales on sides of nose   levETIRAcetam 500 MG tablet Commonly known as: KEPPRA Take 500 mg by mouth every morning. 1 tablet (500mg ) qam and 1/2 tablet (250mg ) in PM. Verified with pt's wife and using pill container on 06/01/22   levETIRAcetam 500 MG tablet Commonly known as: KEPPRA Take 250 mg by mouth every evening. 1 tablet (500mg ) qam and 1/2 tablet (250mg ) in PM. Verified with pt's wife and using pill container on 06/01/22   melatonin 5 MG Tabs Take 5 mg by mouth at bedtime as needed.   metoprolol tartrate 25 MG tablet Commonly known as: LOPRESSOR Take 12.5 mg by mouth 2 (two) times daily.   multivitamin with minerals tablet Take 1 tablet by mouth daily.   prazosin 5 MG capsule Commonly known as: MINIPRESS Take 10 mg by mouth at bedtime.   primidone 50 MG tablet Commonly known as: MYSOLINE Take 50 mg  by mouth 2 (two) times daily.   tamsulosin 0.4 MG Caps capsule Commonly known as: FLOMAX Take 0.4 mg by mouth daily. bedtime   traMADol 50 MG tablet Commonly known as: ULTRAM Take 50 mg by mouth every 6 (six) hours as needed.   triamcinolone ointment 0.1 % Commonly known as: KENALOG Apply 1 Application topically 2 (two) times daily.   venlafaxine XR 150 MG 24 hr capsule Commonly known as: EFFEXOR-XR Take 450 mg by mouth daily. Take 225 mg by mouth once daily   zolpidem 5 MG tablet Commonly known as: AMBIEN Take 2.5 mg by mouth at bedtime as needed.               Durable Medical Equipment  (From admission, onward)           Start     Ordered   06/01/22 2237  For home use only DME Walker rolling  Once       Question Answer Comment  Walker: With 5 Inch Wheels   Patient needs a walker to treat with  the following condition General weakness      06/01/22 2236              Discharge Care Instructions  (From admission, onward)           Start     Ordered   06/04/22 0000  Discharge wound care:       Comments: none   06/04/22 1005            Follow-up Information     [facility] Follow up in 1 week(s).   Specialty: General Practice Contact information: [address] [place] [address] [phone]                Discharge Exam: Ceasar Mons Weights   05/31/22 1538 06/02/22 1700  Weight: 104.3 kg 104.2 kg   General exam: Appears calm and comfortable  Respiratory system: Clear to auscultation. Respiratory effort normal. Cardiovascular system: S1 & S2 heard, RRR. No JVD, murmurs, rubs, gallops or clicks. No pedal edema. Gastrointestinal system: Abdomen is nondistended, soft and nontender. No organomegaly or masses felt. Normal bowel sounds heard. Central nervous system: Alert and oriented. No focal neurological deficits. Extremities: Symmetric 5 x 5 power. Skin: No rashes, lesions or ulcers Psychiatry: Judgement and insight appear normal. Mood & affect appropriate.    Condition at discharge: good  The results of significant diagnostics from this hospitalization (including imaging, microbiology, ancillary and laboratory) are listed below for reference.   Imaging Studies: CT Head Wo Contrast  Result Date: 06/01/2022 CLINICAL DATA:  Altered mental status, confusion EXAM: CT HEAD WITHOUT CONTRAST TECHNIQUE: Contiguous axial images were obtained from the base of the skull through the vertex without intravenous contrast. RADIATION DOSE REDUCTION: This exam was performed according to the departmental dose-optimization program which includes automated exposure control, adjustment of the mA and/or kV according to patient size and/or use of iterative reconstruction technique. COMPARISON:  None Available. FINDINGS: Brain: Normal anatomic configuration.  Parenchymal volume loss is commensurate with the patient's age. Mild periventricular white matter changes are present likely reflecting the sequela of small vessel ischemia. No abnormal intra or extra-axial mass lesion or fluid collection. No abnormal mass effect or midline shift. No evidence of acute intracranial hemorrhage or infarct. Ventricular size is normal. Cerebellum unremarkable. Vascular: No asymmetric hyperdense vasculature at the skull base. Skull: Intact Sinuses/Orbits: Paranasal sinuses are clear. Orbits are unremarkable. Other: Mastoid air cells and middle ear  cavities are clear. IMPRESSION: 1. No acute intracranial hemorrhage or infarct. 2. Mild senescent change. Electronically Signed   By: Helyn Numbers M.D.   On: 06/01/2022 00:24   CT Angio Chest PE W and/or Wo Contrast  Result Date: 05/31/2022 CLINICAL DATA:  Hypoxia and weakness, initial encounter EXAM: CT ANGIOGRAPHY CHEST WITH CONTRAST TECHNIQUE: Multidetector CT imaging of the chest was performed using the standard protocol during bolus administration of intravenous contrast. Multiplanar CT image reconstructions and MIPs were obtained to evaluate the vascular anatomy. RADIATION DOSE REDUCTION: This exam was performed according to the departmental dose-optimization program which includes automated exposure control, adjustment of the mA and/or kV according to patient size and/or use of iterative reconstruction technique. CONTRAST:  OMNIPAQUE IOHEXOL 350 MG/ML SOLN COMPARISON:  Chest from earlier in the same day. FINDINGS: Cardiovascular: Atherosclerotic calcifications of the thoracic aorta are noted. No dissection or aneurysmal dilatation is seen. Aortic valve replacement is noted. Heavy coronary calcifications are seen. The pulmonary artery shows a normal branching pattern bilaterally. No filling defect to suggest pulmonary embolism is noted. Mediastinum/Nodes: Thoracic inlet is within normal limits. No hilar or mediastinal  adenopathy is noted. The esophagus is unremarkable. Lungs/Pleura: Bilateral lower lobe atelectatic changes are seen. No sizable effusion is noted. No parenchymal nodule is seen. Mild scarring is noted in the left lung. Upper Abdomen: Visualized upper abdomen shows no acute abnormality. Musculoskeletal: Degenerative changes of the thoracic spine are noted. No acute rib abnormality is seen. Review of the MIP images confirms the above findings. IMPRESSION: No evidence of pulmonary emboli. Lower lobe atelectatic changes. Aortic Atherosclerosis (ICD10-I70.0). Electronically Signed   By: Alcide Clever M.D.   On: 05/31/2022 21:44   DG Abdomen 1 View  Result Date: 05/31/2022 CLINICAL DATA:  Abdominal pain and constipation EXAM: ABDOMEN - 1 VIEW COMPARISON:  None Available. FINDINGS: Nonobstructive bowel gas pattern. No free air or pneumatosis. No abnormal radio-opaque calculi or mass effect. No acute or substantial osseous abnormality. The sacrum and coccyx are partially obscured by overlying bowel contents. IMPRESSION: Nonobstructive bowel gas pattern. Electronically Signed   By: Agustin Cree M.D.   On: 05/31/2022 16:31   DG Chest 2 View  Result Date: 05/31/2022 CLINICAL DATA:  Weakness EXAM: CHEST - 2 VIEW COMPARISON:  Chest radiograph dated 01/21/2020 FINDINGS: Normal lung volumes. Bilateral interstitial opacities. Trace bilateral pleural effusions. No pneumothorax. Similar cardiomediastinal silhouette. Median sternotomy wires are nondisplaced. IMPRESSION: 1. Bilateral interstitial opacities, likely pulmonary edema. 2. Trace bilateral pleural effusions. Electronically Signed   By: Agustin Cree M.D.   On: 05/31/2022 16:28    Microbiology: Results for orders placed or performed during the hospital encounter of 05/31/22  SARS Coronavirus 2 by RT PCR (hospital order, performed in [facility] hospital lab) *cepheid single result test* Anterior Nasal Swab     Status: None   Collection Time: 05/31/22  9:19 PM   Specimen:  Anterior Nasal Swab  Result Value Ref Range Status   SARS Coronavirus 2 by RT PCR NEGATIVE NEGATIVE Final    Comment: (NOTE) SARS-CoV-2 target nucleic acids are NOT DETECTED.  The SARS-CoV-2 RNA is generally detectable in upper and lower respiratory specimens during the acute phase of infection. The lowest concentration of SARS-CoV-2 viral copies this assay can detect is 250 copies / mL. A negative result does not preclude SARS-CoV-2 infection and should not be used as the sole basis for treatment or other patient management decisions.  A negative result may occur with improper specimen collection /  handling, submission of specimen other than nasopharyngeal swab, presence of viral mutation(s) within the areas targeted by this assay, and inadequate number of viral copies (<250 copies / mL). A negative result must be combined with clinical observations, patient history, and epidemiological information.  Fact Sheet for Patients:   RoadLapTop.co.za  Fact Sheet for Healthcare Providers: http://kim-miller.com/  This test is not yet approved or  cleared by the [place] FDA and has been authorized for detection and/or diagnosis of SARS-CoV-2 by FDA under an Emergency Use Authorization (EUA).  This EUA will remain in effect (meaning this test can be used) for the duration of the COVID-19 declaration under Section 564(b)(1) of the Act, 21 U.S.C. section 360bbb-3(b)(1), unless the authorization is terminated or revoked sooner.  Performed at [facility], [address] [place], [place] [address]   Culture, blood (x 2)     Status: None (Preliminary result)   Collection Time: 06/01/22  4:19 AM   Specimen: BLOOD  Result Value Ref Range Status   Specimen Description BLOOD BLOOD RIGHT ARM  Final   Special Requests   Final    BOTTLES DRAWN AEROBIC AND ANAEROBIC Blood Culture adequate volume   Culture   Final    NO GROWTH 3  DAYS Performed at [facility], [address], [place], [place] [address]    Report Status PENDING  Incomplete  Culture, blood (x 2)     Status: None (Preliminary result)   Collection Time: 06/01/22  4:19 AM   Specimen: BLOOD  Result Value Ref Range Status   Specimen Description BLOOD BLOOD LEFT FOREARM  Final   Special Requests   Final    BOTTLES DRAWN AEROBIC AND ANAEROBIC Blood Culture adequate volume   Culture   Final    NO GROWTH 3 DAYS Performed at [facility], [address] [place], [place] [address]    Report Status PENDING  Incomplete  Respiratory (~20 pathogens) panel by PCR     Status: None   Collection Time: 06/01/22  9:47 AM   Specimen: Nasopharyngeal Swab; Respiratory  Result Value Ref Range Status   Adenovirus NOT DETECTED NOT DETECTED Final   Coronavirus 229E NOT DETECTED NOT DETECTED Final    Comment: (NOTE) The Coronavirus on the Respiratory Panel, DOES NOT test for the novel  Coronavirus (2019 nCoV)    Coronavirus HKU1 NOT DETECTED NOT DETECTED Final   Coronavirus NL63 NOT DETECTED NOT DETECTED Final   Coronavirus OC43 NOT DETECTED NOT DETECTED Final   Metapneumovirus NOT DETECTED NOT DETECTED Final   Rhinovirus / Enterovirus NOT DETECTED NOT DETECTED Final   Influenza A NOT DETECTED NOT DETECTED Final   Influenza B NOT DETECTED NOT DETECTED Final   Parainfluenza Virus 1 NOT DETECTED NOT DETECTED Final   Parainfluenza Virus 2 NOT DETECTED NOT DETECTED Final   Parainfluenza Virus 3 NOT DETECTED NOT DETECTED Final   Parainfluenza Virus 4 NOT DETECTED NOT DETECTED Final   Respiratory Syncytial Virus NOT DETECTED NOT DETECTED Final   Bordetella pertussis NOT DETECTED NOT DETECTED Final   Bordetella Parapertussis NOT DETECTED NOT DETECTED Final   Chlamydophila pneumoniae NOT DETECTED NOT DETECTED Final   Mycoplasma pneumoniae NOT DETECTED NOT DETECTED Final    Comment: Performed at [facility], [address], [place], [place]  [address]  MRSA Next Gen by PCR, Nasal     Status: None   Collection Time: 06/01/22  9:47 AM   Specimen: Nasopharyngeal Swab; Nasal Swab  Result Value  Ref Range Status   MRSA by PCR Next Gen NOT DETECTED NOT DETECTED Final    Comment: (NOTE) The GeneXpert MRSA Assay (FDA approved for NASAL specimens only), is one component of a comprehensive MRSA colonization surveillance program. It is not intended to diagnose MRSA infection nor to guide or monitor treatment for MRSA infections. Test performance is not FDA approved in patients less than 103 years old. Performed at [facility], [address] [place], [place] [address]   C Difficile Quick Screen w PCR reflex     Status: None   Collection Time: 06/02/22  9:40 AM   Specimen: STOOL  Result Value Ref Range Status   C Diff antigen NEGATIVE NEGATIVE Final   C Diff toxin NEGATIVE NEGATIVE Final   C Diff interpretation No C. difficile detected.  Final    Comment: Performed at [facility], [address] [place], [place] [address]  Gastrointestinal Panel by PCR , Stool     Status: Abnormal   Collection Time: 06/02/22  9:40 AM   Specimen: STOOL  Result Value Ref Range Status   Campylobacter species NOT DETECTED NOT DETECTED Final   Plesimonas shigelloides NOT DETECTED NOT DETECTED Final   Salmonella species NOT DETECTED NOT DETECTED Final   Yersinia enterocolitica NOT DETECTED NOT DETECTED Final   Vibrio species NOT DETECTED NOT DETECTED Final   Vibrio cholerae NOT DETECTED NOT DETECTED Final   Enteroaggregative E coli (EAEC) NOT DETECTED NOT DETECTED Final   Enteropathogenic E coli (EPEC) NOT DETECTED NOT DETECTED Final   Enterotoxigenic E coli (ETEC) NOT DETECTED NOT DETECTED Final   Shiga like toxin producing E coli (STEC) NOT DETECTED NOT DETECTED Final   Shigella/Enteroinvasive E coli (EIEC) NOT DETECTED NOT DETECTED Final   Cryptosporidium NOT DETECTED NOT DETECTED Final   Cyclospora cayetanensis NOT DETECTED NOT  DETECTED Final   Entamoeba histolytica NOT DETECTED NOT DETECTED Final   Giardia lamblia NOT DETECTED NOT DETECTED Final   Adenovirus F40/41 NOT DETECTED NOT DETECTED Final   Astrovirus NOT DETECTED NOT DETECTED Final   Norovirus GI/GII NOT DETECTED NOT DETECTED Final   Rotavirus A DETECTED (A) NOT DETECTED Final   Sapovirus (I, II, IV, and V) NOT DETECTED NOT DETECTED Final    Comment: Performed at [facility], [address] [place], [place] [address]    Labs: CBC: Recent Labs  Lab 05/31/22 1542 06/01/22 0419 06/02/22 0641 06/03/22 0524 06/04/22 0627  WBC 9.8 16.8* 11.6* 5.9 4.4  NEUTROABS  --  14.4*  --   --   --   HGB 10.9* 10.1* 9.5* 8.7* 9.4*  HCT 36.7* 32.4* 31.0* 28.4* 30.9*  MCV 84.2 82.7 83.6 82.3 83.3  PLT 92* 86* 68* 56* 55*   Basic Metabolic Panel: Recent Labs  Lab 05/31/22 1542 06/01/22 0419 06/01/22 0947 06/02/22 0641 06/03/22 0524 06/04/22 0627  NA 131* 128*  --  133* 136 136  K 3.8 3.4*  --  3.5 4.0 3.8  CL 100 100  --  106 109 107  CO2 21* 21*  --  21* 24 26  GLUCOSE 125* 152*  --  117* 96 97  BUN 34* 33*  --  29* 22 22  CREATININE 1.16 1.07  --  1.16 0.78 0.73  CALCIUM 8.1* 7.8*  --  7.6* 7.4* 7.8*  MG  --   --   --  1.9 2.1 1.9  PHOS  --   --  3.4  --  3.0  --  Liver Function Tests: Recent Labs  Lab 06/01/22 0419  AST 28  ALT 28  ALKPHOS 52  BILITOT 0.6  PROT 8.4*  ALBUMIN 2.8*   CBG: Recent Labs  Lab 05/31/22 1543 05/31/22 1838  GLUCAP 88 106*    Discharge time spent: greater than 30 minutes.  Signed: Marrion Coy, MD [facility] 06/04/2022

## 2022-06-04 NOTE — Progress Notes (Signed)
Discharge instructions reviewed with patient and wife including followup visits and new medications.  Understanding was verbalized and all questions were answered.  IV removed without complication; patient tolerated well.  Patient discharged home via wheelchair in stable condition escorted by nursing staff.

## 2022-06-06 LAB — CULTURE, BLOOD (ROUTINE X 2): Special Requests: ADEQUATE

## 2022-08-20 ENCOUNTER — Emergency Department
Admission: EM | Admit: 2022-08-20 | Discharge: 2022-08-20 | Disposition: A | Discharge status: Home / Self Care | Payer: No Typology Code available for payment source | Attending: Emergency Medicine | Admitting: Emergency Medicine

## 2022-08-20 ENCOUNTER — Other Ambulatory Visit: Payer: Self-pay

## 2022-08-20 DIAGNOSIS — M25561 Pain in right knee: Secondary | ICD-10-CM | POA: Insufficient documentation

## 2022-08-20 LAB — COMPREHENSIVE METABOLIC PANEL
ALT: 50 U/L — ABNORMAL HIGH (ref 0–44)
AST: 23 U/L (ref 15–41)
Albumin: 2.6 g/dL — ABNORMAL LOW (ref 3.5–5.0)
Alkaline Phosphatase: 66 U/L (ref 38–126)
Anion gap: 7 (ref 5–15)
BUN: 26 mg/dL — ABNORMAL HIGH (ref 8–23)
CO2: 28 mmol/L (ref 22–32)
Calcium: 8.4 mg/dL — ABNORMAL LOW (ref 8.9–10.3)
Chloride: 96 mmol/L — ABNORMAL LOW (ref 98–111)
Creatinine, Ser: 0.87 mg/dL (ref 0.61–1.24)
GFR, Estimated: >60 mL/min (ref >60)
Glucose, Bld: 257 mg/dL — ABNORMAL HIGH (ref 70–99)
Potassium: 4.5 mmol/L (ref 3.5–5.1)
Sodium: 131 mmol/L — ABNORMAL LOW (ref 135–145)
Total Bilirubin: 0.4 mg/dL (ref 0.3–1.2)
Total Protein: 8.9 g/dL — ABNORMAL HIGH (ref 6.5–8.1)

## 2022-08-20 LAB — CBC WITH DIFFERENTIAL/PLATELET
Abs Immature Granulocytes: 0.18 10*3/uL — ABNORMAL HIGH (ref 0.00–0.07)
Basophils Absolute: 0.1 10*3/uL (ref 0.0–0.1)
Basophils Relative: 1 %
Eosinophils Absolute: 0.4 10*3/uL (ref 0.0–0.5)
Eosinophils Relative: 5 %
HCT: 36 % — ABNORMAL LOW (ref 39.0–52.0)
Hemoglobin: 11.3 g/dL — ABNORMAL LOW (ref 13.0–17.0)
Immature Granulocytes: 2 %
Lymphocytes Relative: 16 %
Lymphs Abs: 1.4 10*3/uL (ref 0.7–4.0)
MCH: 25.5 pg — ABNORMAL LOW (ref 26.0–34.0)
MCHC: 31.4 g/dL (ref 30.0–36.0)
MCV: 81.1 fL (ref 80.0–100.0)
Monocytes Absolute: 0.4 10*3/uL (ref 0.1–1.0)
Monocytes Relative: 5 %
Neutro Abs: 5.9 10*3/uL (ref 1.7–7.7)
Neutrophils Relative %: 71 %
Platelets: 75 10*3/uL — ABNORMAL LOW (ref 150–400)
RBC: 4.44 MIL/uL (ref 4.22–5.81)
RDW: 20.3 % — ABNORMAL HIGH (ref 11.5–15.5)
WBC: 8.4 10*3/uL (ref 4.0–10.5)
nRBC: 0 % (ref 0.0–0.2)

## 2022-08-20 LAB — LIPASE, BLOOD: Lipase: 25 U/L (ref 11–51)

## 2022-08-20 LAB — URIC ACID: Uric Acid, Serum: 4.5 mg/dL (ref 3.7–8.6)

## 2022-08-20 LAB — SEDIMENTATION RATE: Sed Rate: 74 mm/hr — ABNORMAL HIGH (ref 0–20)

## 2022-08-20 MED ORDER — LIDOCAINE HCL 1 % IJ SOLN
5.0000 mL | Freq: Once | INTRAMUSCULAR | Status: AC
  Administered 2022-08-20: 5 mL
  Filled 2022-08-20: qty 10

## 2022-08-20 MED ORDER — COLCHICINE 0.6 MG PO TABS: 0.6000 mg | ORAL_TABLET | Freq: Two times a day (BID) | ORAL | 0 refills | Status: DC

## 2022-08-20 MED ORDER — OXYCODONE-ACETAMINOPHEN 5-325 MG PO TABS
1.0000 | ORAL_TABLET | Freq: Once | ORAL | Status: AC
  Administered 2022-08-20: 1 via ORAL
  Filled 2022-08-20: qty 1

## 2022-08-20 MED ORDER — CEPHALEXIN 500 MG PO CAPS: 500.0000 mg | ORAL_CAPSULE | Freq: Four times a day (QID) | ORAL | 0 refills | Status: AC

## 2022-08-20 MED ORDER — HYDROCODONE-ACETAMINOPHEN 5-325 MG PO TABS: 1.0000 | ORAL_TABLET | ORAL | 0 refills | Status: DC | PRN

## 2022-08-20 MED ORDER — COLCHICINE 0.6 MG PO TABS: 0.6000 mg | ORAL_TABLET | Freq: Two times a day (BID) | ORAL | 0 refills | Status: AC

## 2022-08-20 NOTE — Discharge Instructions (Signed)
Take Colchicine as directed.  Call Dr. Ellene Route office in the morning to schedule an appointment to be seen.

## 2022-08-20 NOTE — ED Provider Notes (Signed)
[facility] Provider Note  Patient Contact: 7:37 PM (approximate)   History   Knee Pain   HPI  Alex Gomez is a 77 y.o. male with a history of gout, presents to the emergency department with right knee pain.  Patient does have some very mild erythema along the lower aspect of his right knee and does have pain with range of motion.  He denies any history of prior right knee surgeries or septic knee.  Patient denies fever or chills.  Patient was evaluated at the [place] who conducted blood work which was overall reassuring but given pain with range of motion, referred patient to the emergency department for further evaluation for a septic joint.      Physical Exam   Triage Vital Signs: ED Triage Vitals  Encounter Vitals Group     BP 08/20/22 1845 132/74     Systolic BP Percentile --      Diastolic BP Percentile --      Pulse Rate 08/20/22 1845 69     Resp 08/20/22 1845 20     Temp 08/20/22 1845 97.9 F (36.6 C)     Temp Source 08/20/22 1845 Oral     SpO2 08/20/22 1845 96 %     Weight 08/20/22 1853 229 lb 4.5 oz (104 kg)     Height 08/20/22 1853 6\' 1"  (1.854 m)     Head Circumference --      Peak Flow --      Pain Score 08/20/22 1845 10     Pain Loc --      Pain Education --      Exclude from Growth Chart --     Most recent vital signs: Vitals:   08/20/22 1845  BP: 132/74  Pulse: 69  Resp: 20  Temp: 97.9 F (36.6 C)  SpO2: 96%     General: Alert and in no acute distress. Eyes:  PERRL. EOMI. Head: No acute traumatic findings ENT:      Nose: No congestion/rhinnorhea.      Mouth/Throat: Mucous membranes are moist. Neck: No stridor. No cervical spine tenderness to palpation. Cardiovascular:  Good peripheral perfusion Respiratory: Normal respiratory effort without tachypnea or retractions. Lungs CTAB. Good air entry to the bases with no decreased or absent breath sounds. Gastrointestinal: Bowel sounds 4 quadrants. Soft and nontender to  palpation. No guarding or rigidity. No palpable masses. No distention. No CVA tenderness. Musculoskeletal: Patient has pain with range of motion of the right knee. Neurologic:  No gross focal neurologic deficits are appreciated.  Skin: Patient has some mild erythema along the inferior aspect of the right knee.    ED Results / Procedures / Treatments   Labs (all labs ordered are listed, but only abnormal results are displayed) Labs Reviewed  CBC WITH DIFFERENTIAL/PLATELET - Abnormal; Notable for the following components:      Result Value   Hemoglobin 11.3 (*)    HCT 36.0 (*)    MCH 25.5 (*)    RDW 20.3 (*)    Platelets 75 (*)    Abs Immature Granulocytes 0.18 (*)    All other components within normal limits  COMPREHENSIVE METABOLIC PANEL - Abnormal; Notable for the following components:   Sodium 131 (*)    Chloride 96 (*)    Glucose, Bld 257 (*)    BUN 26 (*)    Calcium 8.4 (*)    Total Protein 8.9 (*)    Albumin 2.6 (*)  ALT 50 (*)    All other components within normal limits  SEDIMENTATION RATE - Abnormal; Notable for the following components:   Sed Rate 74 (*)    All other components within normal limits  LIPASE, BLOOD  URIC ACID  C-REACTIVE PROTEIN  GLUCOSE, BODY FLUID OTHER            PROTEIN, BODY FLUID (OTHER)       RADIOLOGY  I personally viewed and evaluated these images as part of my medical decision making, as well as reviewing the written report by the radiologist.  ED Provider Interpretation: Patient had right knee x-[facility] at [facility] earlier today.  Patient brings radiology report which indicated a 7 mm loose intra-articular foreign body but no other acute abnormalities.   PROCEDURES:  Critical Care performed: No  .Joint Aspiration/Arthrocentesis  Date/Time: 08/20/2022 7:41 PM  Performed by: Orvil Feil, PA-C Authorized by: Orvil Feil, PA-C   Consent:    Consent obtained:  Verbal   Risks discussed:  Bleeding and pain Universal protocol:     Procedure explained and questions answered to patient or proxy's satisfaction: yes     Patient identity confirmed:  Verbally with patient Location:    Location:  Knee   Knee:  R knee Anesthesia:    Anesthesia method:  Local infiltration   Local anesthetic:  Lidocaine 1% w/o epi Procedure details:    Needle gauge:  22 G   Ultrasound guidance: no     Approach:  Lateral   Aspirate amount:  1 ml   Steroid injected: no     Specimen collected: no   Post-procedure details:    Dressing:  Adhesive bandage   Procedure completion:  Tolerated well, no immediate complications    MEDICATIONS ORDERED IN ED: Medications  lidocaine (XYLOCAINE) 1 % (with pres) injection 5 mL (5 mLs Infiltration Given 08/20/22 2009)     IMPRESSION / MDM / ASSESSMENT AND PLAN / ED COURSE  I reviewed the triage vital signs and the nursing notes.                              Assessment and plan Knee pain 77 year old male presents to the emergency department with acute right knee pain.  On exam, patient was alert, active and nontoxic-appearing but did have pain with range of motion.  Patient had some very mild erythema along the inferior aspect of the right knee.    Arthrocentesis was attempted at bedside with no drainable fluid collection.  Patient had a normal white blood cell count on CBC.  CMP consistent with baseline labs.  Patient did have an elevated sed rate at 74.  Uric acid level within range.   I did discuss patient case with orthopedist on-call, Dr. Audelia Acton.  We reviewed patient's presenting symptoms and workup conducted in the emergency department. Dr. Audelia Acton anticipates seeing patient as an outpatient this week.  Patient was restarted on his colchicine and empirically treated with Keflex until he can be seen by orthopedics.  Patient and wife feel comfortable with this plan. FINAL CLINICAL IMPRESSION(S) / ED DIAGNOSES   Final diagnoses:  Acute pain of right knee     Rx / DC Orders   ED  Discharge Orders          Ordered    colchicine 0.6 MG tablet  2 times daily,   Status:  Discontinued        08/20/22 2141  cephALEXin (KEFLEX) 500 MG capsule  4 times daily        08/20/22 2146    colchicine 0.6 MG tablet  2 times daily        08/20/22 2146             Note:  This document was prepared using Dragon voice recognition software and may include unintentional dictation errors.   Pia Mau [place], PA-C 08/20/22 2148    Corena Herter, MD 08/21/22 0005

## 2022-08-20 NOTE — ED Triage Notes (Addendum)
Pt presents to ED with c/o of severe R knee pain, pt states possibly septic R knee. [facility]. NAD noted.   Pt [facility] by [facility], pt has paperwork.

## 2022-08-21 LAB — C-REACTIVE PROTEIN: CRP: 8.5 mg/dL — ABNORMAL HIGH (ref <1.0)

## 2022-08-22 ENCOUNTER — Other Ambulatory Visit
Admission: RE | Admit: 2022-08-22 | Discharge: 2022-08-22 | Disposition: A | Discharge status: Home / Self Care | Payer: Medicare Other | Source: Ambulatory Visit | Attending: Orthopedic Surgery | Admitting: Orthopedic Surgery

## 2022-08-22 DIAGNOSIS — M25561 Pain in right knee: Secondary | ICD-10-CM | POA: Diagnosis present

## 2022-08-22 LAB — SYNOVIAL CELL COUNT + DIFF, W/ CRYSTALS
Crystals, Fluid: NONE SEEN
Eosinophils-Synovial: 0 %
Lymphocytes-Synovial Fld: 30 %
Monocyte-Macrophage-Synovial Fluid: 11 %
Neutrophil, Synovial: 59 %
WBC, Synovial: 731 /mm3 — ABNORMAL HIGH (ref 0–200)

## 2022-09-26 ENCOUNTER — Encounter: Payer: Self-pay | Admitting: Emergency Medicine

## 2022-09-26 ENCOUNTER — Other Ambulatory Visit: Payer: Self-pay

## 2022-09-26 ENCOUNTER — Emergency Department: Payer: No Typology Code available for payment source

## 2022-09-26 ENCOUNTER — Emergency Department
Admission: EM | Admit: 2022-09-26 | Discharge: 2022-09-26 | Disposition: A | Discharge status: Home / Self Care | Payer: No Typology Code available for payment source | Attending: Emergency Medicine | Admitting: Emergency Medicine

## 2022-09-26 DIAGNOSIS — I2510 Atherosclerotic heart disease of native coronary artery without angina pectoris: Secondary | ICD-10-CM | POA: Insufficient documentation

## 2022-09-26 DIAGNOSIS — E119 Type 2 diabetes mellitus without complications: Secondary | ICD-10-CM | POA: Diagnosis not present

## 2022-09-26 DIAGNOSIS — R109 Unspecified abdominal pain: Secondary | ICD-10-CM | POA: Diagnosis present

## 2022-09-26 DIAGNOSIS — M545 Low back pain, unspecified: Secondary | ICD-10-CM | POA: Diagnosis not present

## 2022-09-26 DIAGNOSIS — J449 Chronic obstructive pulmonary disease, unspecified: Secondary | ICD-10-CM | POA: Diagnosis not present

## 2022-09-26 HISTORY — DX: Unspecified convulsions: R56.9

## 2022-09-26 HISTORY — DX: Chronic obstructive pulmonary disease, unspecified: J44.9

## 2022-09-26 HISTORY — DX: Pure hypercholesterolemia, unspecified: E78.00

## 2022-09-26 LAB — COMPREHENSIVE METABOLIC PANEL
ALT: 53 U/L — ABNORMAL HIGH (ref 0–44)
AST: 22 U/L (ref 15–41)
Albumin: 2.6 g/dL — ABNORMAL LOW (ref 3.5–5.0)
Alkaline Phosphatase: 56 U/L (ref 38–126)
Anion gap: 5 (ref 5–15)
BUN: 29 mg/dL — ABNORMAL HIGH (ref 8–23)
CO2: 28 mmol/L (ref 22–32)
Calcium: 8.6 mg/dL — ABNORMAL LOW (ref 8.9–10.3)
Chloride: 101 mmol/L (ref 98–111)
Creatinine, Ser: 0.89 mg/dL (ref 0.61–1.24)
GFR, Estimated: >60 mL/min (ref >60)
Glucose, Bld: 113 mg/dL — ABNORMAL HIGH (ref 70–99)
Potassium: 4.4 mmol/L (ref 3.5–5.1)
Sodium: 134 mmol/L — ABNORMAL LOW (ref 135–145)
Total Bilirubin: 0.4 mg/dL (ref 0.3–1.2)
Total Protein: 8.4 g/dL — ABNORMAL HIGH (ref 6.5–8.1)

## 2022-09-26 LAB — CBC
HCT: 38.8 % — ABNORMAL LOW (ref 39.0–52.0)
Hemoglobin: 12 g/dL — ABNORMAL LOW (ref 13.0–17.0)
MCH: 25.6 pg — ABNORMAL LOW (ref 26.0–34.0)
MCHC: 30.9 g/dL (ref 30.0–36.0)
MCV: 82.7 fL (ref 80.0–100.0)
Platelets: 80 10*3/uL — ABNORMAL LOW (ref 150–400)
RBC: 4.69 MIL/uL (ref 4.22–5.81)
RDW: 20.7 % — ABNORMAL HIGH (ref 11.5–15.5)
WBC: 10.4 10*3/uL (ref 4.0–10.5)
nRBC: 0 % (ref 0.0–0.2)

## 2022-09-26 LAB — URINALYSIS, ROUTINE W REFLEX MICROSCOPIC
Bilirubin Urine: NEGATIVE
Glucose, UA: NEGATIVE mg/dL
Hgb urine dipstick: NEGATIVE
Ketones, ur: NEGATIVE mg/dL
Leukocytes,Ua: NEGATIVE
Nitrite: NEGATIVE
Protein, ur: NEGATIVE mg/dL
Specific Gravity, Urine: >1.046 — ABNORMAL HIGH (ref 1.005–1.030)
pH: 5 (ref 5.0–8.0)

## 2022-09-26 LAB — LIPASE, BLOOD: Lipase: 21 U/L (ref 11–51)

## 2022-09-26 MED ORDER — KETOROLAC TROMETHAMINE 30 MG/ML IJ SOLN: 15.0000 mg | Freq: Once | INTRAMUSCULAR | Status: DC

## 2022-09-26 MED ORDER — ONDANSETRON HCL 4 MG/2ML IJ SOLN
4.0000 mg | Freq: Once | INTRAMUSCULAR | Status: AC
  Administered 2022-09-26: 4 mg via INTRAVENOUS
  Filled 2022-09-26: qty 2

## 2022-09-26 MED ORDER — MORPHINE SULFATE (PF) 4 MG/ML IV SOLN
4.0000 mg | Freq: Once | INTRAVENOUS | Status: AC
  Administered 2022-09-26: 4 mg via INTRAVENOUS
  Filled 2022-09-26: qty 1

## 2022-09-26 MED ORDER — LIDOCAINE 5 % EX PTCH: 1.0000 | MEDICATED_PATCH | Freq: Two times a day (BID) | CUTANEOUS | 0 refills | Status: DC

## 2022-09-26 MED ORDER — KETOROLAC TROMETHAMINE 30 MG/ML IJ SOLN
15.0000 mg | Freq: Once | INTRAMUSCULAR | Status: AC
  Administered 2022-09-26: 15 mg via INTRAVENOUS
  Filled 2022-09-26: qty 1

## 2022-09-26 MED ORDER — OXYCODONE-ACETAMINOPHEN 5-325 MG PO TABS: 1.0000 | ORAL_TABLET | ORAL | 0 refills | Status: DC | PRN

## 2022-09-26 MED ORDER — IOHEXOL 300 MG/ML  SOLN
100.0000 mL | Freq: Once | INTRAMUSCULAR | Status: AC | PRN
  Administered 2022-09-26: 100 mL via INTRAVENOUS

## 2022-09-26 MED ORDER — LIDOCAINE 5 % EX PTCH
1.0000 | MEDICATED_PATCH | CUTANEOUS | Status: DC
  Administered 2022-09-26: 1 via TRANSDERMAL
  Filled 2022-09-26: qty 1

## 2022-09-26 NOTE — ED Provider Notes (Signed)
[facility] Provider Note    Event Date/Time   First MD Initiated Contact with Patient 09/26/22 1254     (approximate)   History   Abdominal Pain and Flank Pain   HPI  Alex Gomez is a 77 y.o. male with a history of kidney stones, COPD, CAD, diabetes who presents with complaints of left back pain.  He reports some pain in the flank, primarily pain is in the left lower back.  He is concerned he could be a kidney stone.  No injury.  No fevers chills or dysuria.     Physical Exam   Triage Vital Signs: ED Triage Vitals  Encounter Vitals Group     BP 09/26/22 1318 136/72     Systolic BP Percentile --      Diastolic BP Percentile --      Pulse Rate 09/26/22 1318 80     Resp 09/26/22 1318 18     Temp 09/26/22 1318 98.3 F (36.8 C)     Temp Source 09/26/22 1318 Oral     SpO2 09/26/22 1318 95 %     Weight 09/26/22 1254 104 kg (229 lb 4.5 oz)     Height 09/26/22 1254 1.854 m (6\' 1" )     Head Circumference --      Peak Flow --      Pain Score 09/26/22 1319 10     Pain Loc --      Pain Education --      Exclude from Growth Chart --     Most recent vital signs: Vitals:   09/26/22 1318 09/26/22 1728  BP: 136/72 130/70  Pulse: 80 78  Resp: 18 18  Temp: 98.3 F (36.8 C) 98 F (36.7 C)  SpO2: 95% 96%     General: Awake, no distress.  CV:  Good peripheral perfusion.  Resp:  Normal effort.  Abd:  No distention.  Soft, nontender Other:  Mild tenderness in the left lumbar paraspinal area   ED Results / Procedures / Treatments   Labs (all labs ordered are listed, but only abnormal results are displayed) Labs Reviewed  CBC - Abnormal; Notable for the following components:      Result Value   Hemoglobin 12.0 (*)    HCT 38.8 (*)    MCH 25.6 (*)    RDW 20.7 (*)    Platelets 80 (*)    All other components within normal limits  COMPREHENSIVE METABOLIC PANEL - Abnormal; Notable for the following components:   Sodium 134 (*)    Glucose, Bld  113 (*)    BUN 29 (*)    Calcium 8.6 (*)    Total Protein 8.4 (*)    Albumin 2.6 (*)    ALT 53 (*)    All other components within normal limits  URINALYSIS, ROUTINE W REFLEX MICROSCOPIC - Abnormal; Notable for the following components:   Color, Urine YELLOW (*)    APPearance CLEAR (*)    Specific Gravity, Urine >1.046 (*)    All other components within normal limits  LIPASE, BLOOD     EKG     RADIOLOGY CT abdomen pelvis pending    PROCEDURES:  Critical Care performed:   Procedures   MEDICATIONS ORDERED IN ED: Medications  morphine (PF) 4 MG/ML injection 4 mg (4 mg Intravenous Given 09/26/22 1316)  ondansetron (ZOFRAN) injection 4 mg (4 mg Intravenous Given 09/26/22 1315)  iohexol (OMNIPAQUE) 300 MG/ML solution 100 mL (100 mLs Intravenous Contrast  Given 09/26/22 1421)  morphine (PF) 4 MG/ML injection 4 mg (4 mg Intravenous Given 09/26/22 1633)  ketorolac (TORADOL) 30 MG/ML injection 15 mg (15 mg Intravenous Given 09/26/22 1846)     IMPRESSION / MDM / ASSESSMENT AND PLAN / ED COURSE  I reviewed the triage vital signs and the nursing notes. Patient's presentation is most consistent with acute presentation with potential threat to life or bodily function.  Patient presents with left back pain, perhaps some left flank pain.  Differential includes musculoskeletal back pain, kidney stone, less likely UTI, diverticulitis  Will treat with IV morphine, IV Zofran, obtain labs, urinalysis, CT scan and reevaluate.  ----------------------------------------- 1:48 PM on 09/26/2022 ----------------------------------------- Patient improved after analgesics, pending CT scan, have asked my colleague to follow-up on CT results.    Clinical Course as of 09/27/22 1136  Wed Sep 26, 2022  1348 L flank pain, labs and Ct pending.  [SM]    Clinical Course User Index [SM] Corena Herter, MD     FINAL CLINICAL IMPRESSION(S) / ED DIAGNOSES   Final diagnoses:  Left flank pain      Rx / DC Orders   ED Discharge Orders          Ordered    oxyCODONE-acetaminophen (PERCOCET) 5-325 MG tablet  Every 4 hours PRN        09/26/22 1854    lidocaine (LIDODERM) 5 %  Every 12 hours        09/26/22 1854             Note:  This document was prepared using Dragon voice recognition software and may include unintentional dictation errors.   Jene Every, MD 09/27/22 1136

## 2022-09-26 NOTE — ED Provider Notes (Signed)
Patient saved in signout following up CT as well as urinalysis.  CT imaging without evidence of acute finding.  Urinalysis without evidence of infection.  Patient's pain improved.  Unclear etiology denies any shortness of breath no chest pain.  He is tolerating p.o.  Does appear stable and appropriate for outpatient follow-up.   Willy Eddy, MD 09/26/22 [phone]

## 2022-09-26 NOTE — ED Provider Notes (Signed)
Care assumed of patient from outgoing provider.  See their note for initial history, exam and plan.  Clinical Course as of 09/26/22 1355  Wed Sep 26, 2022  1348 L flank pain, labs and Ct pending.  [SM]    Clinical Course User Index [SM] Corena Herter, MD  CT scan is still pending, care transferred to incoming provider   Corena Herter, MD 09/26/22 1524

## 2022-09-26 NOTE — ED Triage Notes (Signed)
Presents via ems from home  Left flank pain since Monday  Pain increased this am with some urinary freq and hematuria

## 2022-09-27 ENCOUNTER — Emergency Department
Admission: EM | Admit: 2022-09-27 | Discharge: 2022-09-27 | Disposition: A | Discharge status: Home / Self Care | Payer: No Typology Code available for payment source | Attending: Emergency Medicine | Admitting: Emergency Medicine

## 2022-09-27 ENCOUNTER — Emergency Department: Payer: No Typology Code available for payment source

## 2022-09-27 DIAGNOSIS — K5903 Drug induced constipation: Secondary | ICD-10-CM | POA: Diagnosis not present

## 2022-09-27 DIAGNOSIS — K5900 Constipation, unspecified: Secondary | ICD-10-CM | POA: Diagnosis present

## 2022-09-27 DIAGNOSIS — R109 Unspecified abdominal pain: Secondary | ICD-10-CM

## 2022-09-27 LAB — COMPREHENSIVE METABOLIC PANEL
ALT: 40 U/L (ref 0–44)
AST: 14 U/L — ABNORMAL LOW (ref 15–41)
Albumin: 2.5 g/dL — ABNORMAL LOW (ref 3.5–5.0)
Alkaline Phosphatase: 57 U/L (ref 38–126)
Anion gap: 7 (ref 5–15)
BUN: 29 mg/dL — ABNORMAL HIGH (ref 8–23)
CO2: 28 mmol/L (ref 22–32)
Calcium: 9 mg/dL (ref 8.9–10.3)
Chloride: 98 mmol/L (ref 98–111)
Creatinine, Ser: 0.99 mg/dL (ref 0.61–1.24)
GFR, Estimated: >60 mL/min (ref >60)
Glucose, Bld: 117 mg/dL — ABNORMAL HIGH (ref 70–99)
Potassium: 5 mmol/L (ref 3.5–5.1)
Sodium: 133 mmol/L — ABNORMAL LOW (ref 135–145)
Total Bilirubin: 0.3 mg/dL (ref 0.3–1.2)
Total Protein: 8.3 g/dL — ABNORMAL HIGH (ref 6.5–8.1)

## 2022-09-27 LAB — CBC
HCT: 38.2 % — ABNORMAL LOW (ref 39.0–52.0)
Hemoglobin: 11.6 g/dL — ABNORMAL LOW (ref 13.0–17.0)
MCH: 25.4 pg — ABNORMAL LOW (ref 26.0–34.0)
MCHC: 30.4 g/dL (ref 30.0–36.0)
MCV: 83.6 fL (ref 80.0–100.0)
Platelets: 75 10*3/uL — ABNORMAL LOW (ref 150–400)
RBC: 4.57 MIL/uL (ref 4.22–5.81)
RDW: 20.5 % — ABNORMAL HIGH (ref 11.5–15.5)
WBC: 10.3 10*3/uL (ref 4.0–10.5)
nRBC: 0 % (ref 0.0–0.2)

## 2022-09-27 MED ORDER — POLYETHYLENE GLYCOL 3350 17 GM/SCOOP PO POWD: 1.0000 | Freq: Once | ORAL | 0 refills | Status: AC

## 2022-09-27 MED ORDER — AEROCHAMBER MV MISC: 0 refills | Status: DC

## 2022-09-27 MED ORDER — MORPHINE SULFATE (PF) 4 MG/ML IV SOLN
4.0000 mg | Freq: Once | INTRAVENOUS | Status: AC
  Administered 2022-09-27: 4 mg via INTRAVENOUS
  Filled 2022-09-27: qty 1

## 2022-09-27 MED ORDER — SODIUM CHLORIDE 0.9 % IV BOLUS
1000.0000 mL | Freq: Once | INTRAVENOUS | Status: AC
  Administered 2022-09-27: 1000 mL via INTRAVENOUS

## 2022-09-27 MED ORDER — ALBUTEROL SULFATE HFA 108 (90 BASE) MCG/ACT IN AERS: 2.0000 | INHALATION_SPRAY | Freq: Four times a day (QID) | RESPIRATORY_TRACT | 2 refills | Status: DC | PRN

## 2022-09-27 NOTE — ED Triage Notes (Signed)
Patient arrives from home via EMS due to no BM for 1 week. Patient was here yesterday for similar complaint. Patient groaning in pain during triage.

## 2022-09-27 NOTE — Discharge Instructions (Addendum)
Please use MiraLAX one half capful every hour until your first bowel movement.  Please do not take any MiraLAX after this for at least 24 hours.  You may use one half capful twice a day of MiraLAX in order to have 1 solid well-formed bowel movement per day.  You may increase or decrease this dosage as needed to obtain this 1 well-formed bowel movement.  Please make sure that you are drinking at least 8 ounces of water every hour during this initial bowel regimen. ?

## 2022-09-27 NOTE — ED Provider Notes (Signed)
[facility] Provider Note   Event Date/Time   First MD Initiated Contact with Patient 09/27/22 1200     (approximate) History  Flank Pain (/) and Constipation  HPI Alex Gomez is a 77 y.o. male with past medical history of chronic pain on chronic opiates who presents complaining of left flank pain in the setting of absent bowel movements over the last week.  Wife at bedside states the patient has had issues with constipation and significant abdominal pain in the past requiring disimpaction.  Patient was seen yesterday with a clear workup with CT, urinalysis, and blood work that only showed bilateral calyceal kidney stones without any ureteral stones. ROS: Patient currently denies any vision changes, tinnitus, difficulty speaking, facial droop, sore throat, chest pain, shortness of breath, nausea/vomiting/diarrhea, dysuria, or weakness/numbness/paresthesias in any extremity   Physical Exam  Triage Vital Signs: ED Triage Vitals  Encounter Vitals Group     BP 09/27/22 1058 131/65     Systolic BP Percentile --      Diastolic BP Percentile --      Pulse Rate 09/27/22 1058 77     Resp 09/27/22 1058 18     Temp 09/27/22 1058 98.7 F (37.1 C)     Temp Source 09/27/22 1058 Oral     SpO2 09/27/22 1058 98 %     Weight 09/27/22 1059 229 lb 4.5 oz (104 kg)     Height 09/27/22 1059 6\' 1"  (1.854 m)     Head Circumference --      Peak Flow --      Pain Score 09/27/22 1100 10     Pain Loc --      Pain Education --      Exclude from Growth Chart --    Most recent vital signs: Vitals:   09/27/22 1300 09/27/22 1430  BP: (!) 140/68 130/76  Pulse: 73 73  Resp: 16 15  Temp:    SpO2: 92% 94%   General: Awake, oriented x4. CV:  Good peripheral perfusion.  Resp:  Normal effort.  Abd:  No distention.  Generalized tenderness to palpation Other:  Elderly obese Caucasian male writhing in stretcher moaning in moderate distress secondary to pain ED Results / Procedures /  Treatments  Labs (all labs ordered are listed, but only abnormal results are displayed) Labs Reviewed  CBC - Abnormal; Notable for the following components:      Result Value   Hemoglobin 11.6 (*)    HCT 38.2 (*)    MCH 25.4 (*)    RDW 20.5 (*)    Platelets 75 (*)    All other components within normal limits  COMPREHENSIVE METABOLIC PANEL - Abnormal; Notable for the following components:   Sodium 133 (*)    Glucose, Bld 117 (*)    BUN 29 (*)    Total Protein 8.3 (*)    Albumin 2.5 (*)    AST 14 (*)    All other components within normal limits   RADIOLOGY ED MD interpretation: Single view portable abdominal x-ray shows prominent stool throughout the colon favoring mild constipation, bilateral nonobstructive nephrolithiasis, and atelectasis versus pleural effusions of bilateral lung bases -Agree with radiology assessment Official radiology report(s): DG Abd Portable 1 View  Result Date: 09/27/2022 CLINICAL DATA:  Constipation left flank pain EXAM: PORTABLE ABDOMEN - 1 VIEW COMPARISON:  CT abdomen 09/26/2022 FINDINGS: Prominent stool throughout the colon favors mild constipation. Residual contrast medium in the urinary bladder. Speckled calcifications over  the kidneys, more visible on the right than the left, compatible with nonobstructive renal calculi as shown on recent CT. Indistinct bandlike airspace opacity at the lung bases compatible with atelectasis and potentially small pleural effusions. Thoracolumbar spondylosis.  Mild degenerative hip arthropathy. IMPRESSION: 1. Prominent stool throughout the colon favors mild constipation. 2. Bilateral nonobstructive nephrolithiasis. 3. Indistinct bandlike airspace opacity at the lung bases compatible with atelectasis and potentially small pleural effusions. Electronically Signed   By: Gaylyn Rong M.D.   On: 09/27/2022 14:39   PROCEDURES: Critical Care performed: No Procedures MEDICATIONS ORDERED IN ED: Medications  sodium chloride  0.9 % bolus 1,000 mL (0 mLs Intravenous Stopped 09/27/22 1505)  morphine (PF) 4 MG/ML injection 4 mg (4 mg Intravenous Given 09/27/22 1245)   IMPRESSION / MDM / ASSESSMENT AND PLAN / ED COURSE  I reviewed the triage vital signs and the nursing notes.                             The patient is on the cardiac monitor to evaluate for evidence of arrhythmia and/or significant heart rate changes. Patient's presentation is most consistent with acute presentation with potential threat to life or bodily function. Patient's history and exam most consistent with constipation as an etiology for their pain.  Patient's symptoms not typical for other emergent causes of abdominal pain such as, but not limited to, appendicitis, abdominal aortic aneurysm, pancreatitis, SBO, mesenteric ischemia, serious intra-abdominal bacterial illness.  Patient without red flags concerning for cancer as a constipation etiology.  Rx: Miralax  Disposition:  Patient will be discharged with strict return precautions and follow up with primary MD within 24-48 hours for further evaluation. Patient understands that this still may have an early presentation of an emergent medical condition such as appendicitis that will require a recheck.   FINAL CLINICAL IMPRESSION(S) / ED DIAGNOSES   Final diagnoses:  Drug-induced constipation  Acute left flank pain   Rx / DC Orders   ED Discharge Orders          Ordered    polyethylene glycol powder (GLYCOLAX/MIRALAX) 17 GM/SCOOP powder   Once        09/27/22 1425    albuterol (VENTOLIN HFA) 108 (90 Base) MCG/ACT inhaler  Every 6 hours PRN        09/27/22 1536    Spacer/Aero-Holding Chambers (AEROCHAMBER MV) inhaler        09/27/22 1536           Note:  This document was prepared using Dragon voice recognition software and may include unintentional dictation errors.   Merwyn Katos, MD 09/27/22 1536

## 2022-09-27 NOTE — ED Notes (Signed)
First Nurse Note: Pt to ED via [facility] from home for left flank pain. Pt seen yesterday for same. EMS reports that pt has not had a BM in 1 week. Pt placed in a recliner by EMS.

## 2022-10-01 ENCOUNTER — Inpatient Hospital Stay
Admission: EM | Admit: 2022-10-01 | Discharge: 2022-10-10 | DRG: 190 | Disposition: A | Discharge status: Skilled Nursing Facility | Payer: No Typology Code available for payment source | Attending: Obstetrics and Gynecology | Admitting: Obstetrics and Gynecology

## 2022-10-01 ENCOUNTER — Other Ambulatory Visit: Payer: Self-pay

## 2022-10-01 ENCOUNTER — Emergency Department: Payer: No Typology Code available for payment source

## 2022-10-01 DIAGNOSIS — D696 Thrombocytopenia, unspecified: Secondary | ICD-10-CM | POA: Diagnosis present

## 2022-10-01 DIAGNOSIS — R531 Weakness: Secondary | ICD-10-CM

## 2022-10-01 DIAGNOSIS — Z8673 Personal history of transient ischemic attack (TIA), and cerebral infarction without residual deficits: Secondary | ICD-10-CM

## 2022-10-01 DIAGNOSIS — I350 Nonrheumatic aortic (valve) stenosis: Secondary | ICD-10-CM | POA: Diagnosis present

## 2022-10-01 DIAGNOSIS — R4701 Aphasia: Secondary | ICD-10-CM | POA: Diagnosis present

## 2022-10-01 DIAGNOSIS — I2510 Atherosclerotic heart disease of native coronary artery without angina pectoris: Secondary | ICD-10-CM | POA: Diagnosis present

## 2022-10-01 DIAGNOSIS — Z955 Presence of coronary angioplasty implant and graft: Secondary | ICD-10-CM

## 2022-10-01 DIAGNOSIS — I2583 Coronary atherosclerosis due to lipid rich plaque: Secondary | ICD-10-CM | POA: Diagnosis not present

## 2022-10-01 DIAGNOSIS — M109 Gout, unspecified: Secondary | ICD-10-CM | POA: Diagnosis present

## 2022-10-01 DIAGNOSIS — G40909 Epilepsy, unspecified, not intractable, without status epilepticus: Secondary | ICD-10-CM | POA: Diagnosis present

## 2022-10-01 DIAGNOSIS — I10 Essential (primary) hypertension: Secondary | ICD-10-CM | POA: Diagnosis present

## 2022-10-01 DIAGNOSIS — M7989 Other specified soft tissue disorders: Secondary | ICD-10-CM | POA: Diagnosis present

## 2022-10-01 DIAGNOSIS — E119 Type 2 diabetes mellitus without complications: Secondary | ICD-10-CM | POA: Diagnosis present

## 2022-10-01 DIAGNOSIS — M25522 Pain in left elbow: Secondary | ICD-10-CM | POA: Diagnosis present

## 2022-10-01 DIAGNOSIS — F0390 Unspecified dementia without behavioral disturbance: Secondary | ICD-10-CM | POA: Diagnosis present

## 2022-10-01 DIAGNOSIS — J9601 Acute respiratory failure with hypoxia: Secondary | ICD-10-CM | POA: Diagnosis present

## 2022-10-01 DIAGNOSIS — Z79899 Other long term (current) drug therapy: Secondary | ICD-10-CM

## 2022-10-01 DIAGNOSIS — R627 Adult failure to thrive: Secondary | ICD-10-CM | POA: Diagnosis present

## 2022-10-01 DIAGNOSIS — Z1152 Encounter for screening for COVID-19: Secondary | ICD-10-CM | POA: Diagnosis not present

## 2022-10-01 DIAGNOSIS — D509 Iron deficiency anemia, unspecified: Secondary | ICD-10-CM | POA: Diagnosis present

## 2022-10-01 DIAGNOSIS — J441 Chronic obstructive pulmonary disease with (acute) exacerbation: Secondary | ICD-10-CM | POA: Diagnosis present

## 2022-10-01 DIAGNOSIS — R569 Unspecified convulsions: Secondary | ICD-10-CM

## 2022-10-01 DIAGNOSIS — D508 Other iron deficiency anemias: Secondary | ICD-10-CM | POA: Diagnosis not present

## 2022-10-01 DIAGNOSIS — C9000 Multiple myeloma not having achieved remission: Secondary | ICD-10-CM | POA: Diagnosis present

## 2022-10-01 DIAGNOSIS — J9811 Atelectasis: Secondary | ICD-10-CM | POA: Diagnosis present

## 2022-10-01 DIAGNOSIS — Z87891 Personal history of nicotine dependence: Secondary | ICD-10-CM

## 2022-10-01 DIAGNOSIS — E871 Hypo-osmolality and hyponatremia: Secondary | ICD-10-CM | POA: Diagnosis present

## 2022-10-01 DIAGNOSIS — F32A Depression, unspecified: Secondary | ICD-10-CM | POA: Diagnosis present

## 2022-10-01 DIAGNOSIS — F1010 Alcohol abuse, uncomplicated: Secondary | ICD-10-CM | POA: Diagnosis present

## 2022-10-01 DIAGNOSIS — Z952 Presence of prosthetic heart valve: Secondary | ICD-10-CM

## 2022-10-01 DIAGNOSIS — R0602 Shortness of breath: Secondary | ICD-10-CM | POA: Diagnosis present

## 2022-10-01 DIAGNOSIS — E7800 Pure hypercholesterolemia, unspecified: Secondary | ICD-10-CM | POA: Diagnosis present

## 2022-10-01 DIAGNOSIS — R0902 Hypoxemia: Principal | ICD-10-CM

## 2022-10-01 DIAGNOSIS — M25521 Pain in right elbow: Secondary | ICD-10-CM | POA: Diagnosis not present

## 2022-10-01 DIAGNOSIS — R0603 Acute respiratory distress: Secondary | ICD-10-CM | POA: Diagnosis present

## 2022-10-01 DIAGNOSIS — G4733 Obstructive sleep apnea (adult) (pediatric): Secondary | ICD-10-CM | POA: Diagnosis present

## 2022-10-01 DIAGNOSIS — Z91013 Allergy to seafood: Secondary | ICD-10-CM

## 2022-10-01 DIAGNOSIS — Z7982 Long term (current) use of aspirin: Secondary | ICD-10-CM

## 2022-10-01 DIAGNOSIS — K5641 Fecal impaction: Secondary | ICD-10-CM | POA: Diagnosis present

## 2022-10-01 DIAGNOSIS — Z751 Person awaiting admission to adequate facility elsewhere: Secondary | ICD-10-CM

## 2022-10-01 LAB — CBC
HCT: 37.2 % — ABNORMAL LOW (ref 39.0–52.0)
Hemoglobin: 11.7 g/dL — ABNORMAL LOW (ref 13.0–17.0)
MCH: 25.2 pg — ABNORMAL LOW (ref 26.0–34.0)
MCHC: 31.5 g/dL (ref 30.0–36.0)
MCV: 80 fL (ref 80.0–100.0)
Platelets: 62 10*3/uL — ABNORMAL LOW (ref 150–400)
RBC: 4.65 MIL/uL (ref 4.22–5.81)
RDW: 20.3 % — ABNORMAL HIGH (ref 11.5–15.5)
WBC: 5.4 10*3/uL (ref 4.0–10.5)
nRBC: 0 % (ref 0.0–0.2)

## 2022-10-01 LAB — BASIC METABOLIC PANEL
Anion gap: 7 (ref 5–15)
BUN: 18 mg/dL (ref 8–23)
CO2: 30 mmol/L (ref 22–32)
Calcium: 8.3 mg/dL — ABNORMAL LOW (ref 8.9–10.3)
Chloride: 97 mmol/L — ABNORMAL LOW (ref 98–111)
Creatinine, Ser: 0.84 mg/dL (ref 0.61–1.24)
GFR, Estimated: >60 mL/min (ref >60)
Glucose, Bld: 128 mg/dL — ABNORMAL HIGH (ref 70–99)
Potassium: 4.5 mmol/L (ref 3.5–5.1)
Sodium: 134 mmol/L — ABNORMAL LOW (ref 135–145)

## 2022-10-01 LAB — TROPONIN I (HIGH SENSITIVITY)
Troponin I (High Sensitivity): 4 ng/L (ref <18)
Troponin I (High Sensitivity): 6 ng/L (ref <18)

## 2022-10-01 LAB — RESP PANEL BY RT-PCR (RSV, FLU A&B, COVID)  RVPGX2
Influenza A by PCR: NEGATIVE
Influenza B by PCR: NEGATIVE
Resp Syncytial Virus by PCR: NEGATIVE
SARS Coronavirus 2 by RT PCR: NEGATIVE

## 2022-10-01 LAB — PROCALCITONIN: Procalcitonin: <0.1 ng/mL

## 2022-10-01 LAB — BRAIN NATRIURETIC PEPTIDE: B Natriuretic Peptide: 104 pg/mL — ABNORMAL HIGH (ref 0.0–100.0)

## 2022-10-01 MED ORDER — MELATONIN 5 MG PO TABS
5.0000 mg | ORAL_TABLET | Freq: Every evening | ORAL | Status: DC | PRN
  Administered 2022-10-01 – 2022-10-04 (×4): 5 mg via ORAL
  Filled 2022-10-01 (×4): qty 1

## 2022-10-01 MED ORDER — OXYCODONE HCL 5 MG PO TABS
5.0000 mg | ORAL_TABLET | ORAL | Status: DC | PRN
  Administered 2022-10-02 – 2022-10-08 (×4): 5 mg via ORAL
  Filled 2022-10-01 (×4): qty 1

## 2022-10-01 MED ORDER — METOPROLOL TARTRATE 25 MG PO TABS
12.5000 mg | ORAL_TABLET | Freq: Two times a day (BID) | ORAL | Status: DC
  Administered 2022-10-01 – 2022-10-10 (×18): 12.5 mg via ORAL
  Filled 2022-10-01 (×19): qty 1

## 2022-10-01 MED ORDER — LEVETIRACETAM 500 MG PO TABS: 500.0000 mg | ORAL_TABLET | Freq: Every morning | ORAL | Status: DC

## 2022-10-01 MED ORDER — PRAZOSIN HCL 5 MG PO CAPS
10.0000 mg | ORAL_CAPSULE | Freq: Every day | ORAL | Status: DC
  Administered 2022-10-02 – 2022-10-09 (×8): 10 mg via ORAL
  Filled 2022-10-01 (×8): qty 2

## 2022-10-01 MED ORDER — ACETAMINOPHEN 325 MG PO TABS
650.0000 mg | ORAL_TABLET | Freq: Four times a day (QID) | ORAL | Status: DC | PRN
  Administered 2022-10-05 – 2022-10-07 (×3): 650 mg via ORAL
  Filled 2022-10-01 (×3): qty 2

## 2022-10-01 MED ORDER — ONDANSETRON HCL 4 MG/2ML IJ SOLN: 4.0000 mg | Freq: Four times a day (QID) | INTRAMUSCULAR | Status: DC | PRN

## 2022-10-01 MED ORDER — INSULIN ASPART 100 UNIT/ML IJ SOLN
0.0000 [IU] | Freq: Every day | INTRAMUSCULAR | Status: DC
  Administered 2022-10-07 – 2022-10-08 (×2): 3 [IU] via SUBCUTANEOUS
  Administered 2022-10-09: 2 [IU] via SUBCUTANEOUS
  Filled 2022-10-01 (×3): qty 1

## 2022-10-01 MED ORDER — ALBUTEROL SULFATE (2.5 MG/3ML) 0.083% IN NEBU: 2.5000 mg | INHALATION_SOLUTION | Freq: Four times a day (QID) | RESPIRATORY_TRACT | Status: DC | PRN

## 2022-10-01 MED ORDER — SODIUM CHLORIDE 0.9% FLUSH
3.0000 mL | Freq: Two times a day (BID) | INTRAVENOUS | Status: DC
  Administered 2022-10-02 – 2022-10-10 (×16): 3 mL via INTRAVENOUS

## 2022-10-01 MED ORDER — IOHEXOL 350 MG/ML SOLN
75.0000 mL | Freq: Once | INTRAVENOUS | Status: AC | PRN
  Administered 2022-10-01: 75 mL via INTRAVENOUS

## 2022-10-01 MED ORDER — ZOLPIDEM TARTRATE 5 MG PO TABS
2.5000 mg | ORAL_TABLET | Freq: Every evening | ORAL | Status: DC | PRN
  Administered 2022-10-01 – 2022-10-09 (×9): 2.5 mg via ORAL
  Filled 2022-10-01 (×9): qty 1

## 2022-10-01 MED ORDER — INSULIN ASPART 100 UNIT/ML IJ SOLN
0.0000 [IU] | Freq: Three times a day (TID) | INTRAMUSCULAR | Status: DC
  Administered 2022-10-02 – 2022-10-03 (×4): 1 [IU] via SUBCUTANEOUS
  Administered 2022-10-04 – 2022-10-06 (×5): 2 [IU] via SUBCUTANEOUS
  Administered 2022-10-06: 1 [IU] via SUBCUTANEOUS
  Administered 2022-10-07: 3 [IU] via SUBCUTANEOUS
  Administered 2022-10-07: 2 [IU] via SUBCUTANEOUS
  Administered 2022-10-07 – 2022-10-08 (×2): 1 [IU] via SUBCUTANEOUS
  Administered 2022-10-08: 2 [IU] via SUBCUTANEOUS
  Administered 2022-10-08: 5 [IU] via SUBCUTANEOUS
  Administered 2022-10-09: 3 [IU] via SUBCUTANEOUS
  Administered 2022-10-09 (×2): 1 [IU] via SUBCUTANEOUS
  Filled 2022-10-01 (×17): qty 1

## 2022-10-01 MED ORDER — HYDRALAZINE HCL 20 MG/ML IJ SOLN: 5.0000 mg | INTRAMUSCULAR | Status: DC | PRN

## 2022-10-01 MED ORDER — ALBUTEROL SULFATE HFA 108 (90 BASE) MCG/ACT IN AERS: 2.0000 | INHALATION_SPRAY | Freq: Four times a day (QID) | RESPIRATORY_TRACT | Status: DC | PRN

## 2022-10-01 MED ORDER — VENLAFAXINE HCL ER 150 MG PO CP24
150.0000 mg | ORAL_CAPSULE | ORAL | Status: AC
  Administered 2022-10-01: 150 mg via ORAL
  Filled 2022-10-01: qty 1

## 2022-10-01 MED ORDER — ONDANSETRON HCL 4 MG PO TABS: 4.0000 mg | ORAL_TABLET | Freq: Four times a day (QID) | ORAL | Status: DC | PRN

## 2022-10-01 MED ORDER — TRAMADOL HCL 50 MG PO TABS
50.0000 mg | ORAL_TABLET | Freq: Four times a day (QID) | ORAL | Status: DC | PRN
  Administered 2022-10-01: 50 mg via ORAL
  Filled 2022-10-01: qty 1

## 2022-10-01 MED ORDER — MORPHINE SULFATE (PF) 2 MG/ML IV SOLN: 2.0000 mg | INTRAVENOUS | Status: DC | PRN

## 2022-10-01 MED ORDER — THIAMINE HCL 100 MG/ML IJ SOLN
100.0000 mg | INTRAMUSCULAR | Status: DC
  Administered 2022-10-02 – 2022-10-05 (×5): 100 mg via INTRAVENOUS
  Filled 2022-10-01 (×5): qty 2

## 2022-10-01 MED ORDER — HEPARIN SODIUM (PORCINE) 5000 UNIT/ML IJ SOLN
5000.0000 [IU] | Freq: Two times a day (BID) | INTRAMUSCULAR | Status: DC
  Administered 2022-10-02 (×2): 5000 [IU] via SUBCUTANEOUS
  Filled 2022-10-01 (×2): qty 1

## 2022-10-01 MED ORDER — PRIMIDONE 50 MG PO TABS
50.0000 mg | ORAL_TABLET | Freq: Two times a day (BID) | ORAL | Status: DC
  Administered 2022-10-02 – 2022-10-10 (×17): 50 mg via ORAL
  Filled 2022-10-01 (×21): qty 1

## 2022-10-01 MED ORDER — TAMSULOSIN HCL 0.4 MG PO CAPS
0.4000 mg | ORAL_CAPSULE | Freq: Every day | ORAL | Status: DC
  Administered 2022-10-01 – 2022-10-10 (×10): 0.4 mg via ORAL
  Filled 2022-10-01 (×10): qty 1

## 2022-10-01 MED ORDER — ACETAMINOPHEN 650 MG RE SUPP: 650.0000 mg | Freq: Four times a day (QID) | RECTAL | Status: DC | PRN

## 2022-10-01 MED ORDER — FINASTERIDE 5 MG PO TABS
5.0000 mg | ORAL_TABLET | Freq: Every day | ORAL | Status: DC
  Administered 2022-10-02 – 2022-10-09 (×8): 5 mg via ORAL
  Filled 2022-10-01 (×11): qty 1

## 2022-10-01 NOTE — Assessment & Plan Note (Signed)
CPAP per home settings.   

## 2022-10-01 NOTE — Assessment & Plan Note (Signed)
Aspiration/ seizure precaution and cont keppra and Stop tramadol.

## 2022-10-01 NOTE — Assessment & Plan Note (Signed)
Pt coming with SOB/ DOE / respiratory failure and hypoxia with SpO2: 93 % on    CTA negative and suspect from new CHF.

## 2022-10-01 NOTE — Assessment & Plan Note (Signed)
H/o CAD we will follow Troponin and  obtain 2 d echo. C Cardiology consulted fro New CHF pt has AVR replacement. Strict I/O.  Lasix 20 m g iv q12h once ready.

## 2022-10-01 NOTE — ED Notes (Signed)
At bedside speaking to pt and wife for approx 30 mins

## 2022-10-01 NOTE — Assessment & Plan Note (Signed)
Vitals:   10/01/22 1301 10/01/22 1515 10/01/22 1730  BP: (!) 149/69 135/62 (!) 118/57  Stable we will cont lopressor/ prazosin.

## 2022-10-01 NOTE — Assessment & Plan Note (Signed)
Sliding scale regimen.

## 2022-10-01 NOTE — ED Triage Notes (Signed)
Pt to ED [facility] from home for cough x1 week. Reports decreased input. Wife reports pt is also depressed and has mention wanting to die. Pt denies SI.  Cough noted.  Wife also reports pt has had generalized weakness for 1 week and needed extra assistance standing.   88% on RA in triage, placedon 2 L [place] in triage

## 2022-10-01 NOTE — Assessment & Plan Note (Signed)
Ethanol level. CIWA. Thiamine 100 mg

## 2022-10-01 NOTE — Assessment & Plan Note (Signed)
    Latest Ref Rng & Units 10/01/2022    1:06 PM 09/27/2022   11:54 AM 09/26/2022    1:09 PM  CBC  WBC 4.0 - 10.5 K/uL 5.4  10.3  10.4   Hemoglobin 13.0 - 17.0 g/dL 09.8  11.9  14.7   Hematocrit 39.0 - 52.0 % 37.2  38.2  38.8   Platelets 150 - 400 K/uL 62  75  80   Type/ screen.

## 2022-10-01 NOTE — ED Provider Notes (Signed)
[facility] Provider Note    Event Date/Time   First MD Initiated Contact with Patient 10/01/22 1459     (approximate)   History   Cough   HPI Alex Gomez is a 77 y.o. male with HTN, DM2, multiple myeloma, CAD, dementia presenting today for cough.  Patient presents with wife who states over the past week he has had productive cough, weakness, chills, and shortness of breath.  They feel they have noticed increased swelling to his lower extremities as well.  He has had progressive weakness and is now no longer able to get up and ambulate.  He intermittently has chest pain but none is present at this time.  Otherwise denying nausea, vomiting, abdominal pain.  Chart reviewed with no recent echocardiogram in our system.  Although there is a note from 2018 referencing an echo in 2015 showing normal EF at 55%.     Physical Exam   Triage Vital Signs: ED Triage Vitals  Encounter Vitals Group     BP 10/01/22 1301 (!) 149/69     Systolic BP Percentile --      Diastolic BP Percentile --      Pulse Rate 10/01/22 1301 73     Resp 10/01/22 1301 (!) 22     Temp 10/01/22 1301 98.3 F (36.8 C)     Temp src --      SpO2 10/01/22 1301 (!) 88 %     Weight 10/01/22 1304 229 lb 4.5 oz (104 kg)     Height 10/01/22 1304 6\' 1"  (1.854 m)     Head Circumference --      Peak Flow --      Pain Score 10/01/22 1304 0     Pain Loc --      Pain Education --      Exclude from Growth Chart --     Most recent vital signs: Vitals:   10/01/22 1730 10/01/22 1858  BP: (!) 118/57   Pulse: 72   Resp: (!) 21   Temp:  98.2 F (36.8 C)  SpO2: 93%    Physical Exam: I have reviewed the vital signs and nursing notes. General: Awake, alert, no acute distress.  Ill appearing. Head:  Atraumatic, normocephalic.   ENT:  EOM intact, PERRL. Oral mucosa is pink and moist with no lesions. Neck: Neck is supple with full range of motion, No meningeal signs. Cardiovascular:  RRR, No  murmurs. Peripheral pulses palpable and equal bilaterally. Respiratory:  Symmetrical chest wall expansion.  Mild tachypnea with productive cough in the room.  Rhonchi noted in bilateral lung bases.  No wheezing. Musculoskeletal:  No cyanosis or edema. Moving extremities with full ROM Abdomen:  Soft, nontender, nondistended. Neuro:  GCS 15, moving all four extremities, interacting appropriately. Speech clear. Psych:  Calm, appropriate.   Skin:  Warm, dry, no rash.    ED Results / Procedures / Treatments   Labs (all labs ordered are listed, but only abnormal results are displayed) Labs Reviewed  CBC - Abnormal; Notable for the following components:      Result Value   Hemoglobin 11.7 (*)    HCT 37.2 (*)    MCH 25.2 (*)    RDW 20.3 (*)    Platelets 62 (*)    All other components within normal limits  BASIC METABOLIC PANEL - Abnormal; Notable for the following components:   Sodium 134 (*)    Chloride 97 (*)    Glucose, Bld 128 (*)  Calcium 8.3 (*)    All other components within normal limits  BRAIN NATRIURETIC PEPTIDE - Abnormal; Notable for the following components:   B Natriuretic Peptide 104.0 (*)    All other components within normal limits  RESP PANEL BY RT-PCR (RSV, FLU A&B, COVID)  RVPGX2  PROCALCITONIN  HEPATIC FUNCTION PANEL  HEMOGLOBIN A1C  TROPONIN I (HIGH SENSITIVITY)  TROPONIN I (HIGH SENSITIVITY)     EKG My EKG interpretation: Rate of 69, normal sinus rhythm, normal axis, normal intervals.  No acute ST elevations or depressions.   RADIOLOGY Independently interpreted chest x-ray and CTA chest with no acute pathology   PROCEDURES:  Critical Care performed: No  Procedures   MEDICATIONS ORDERED IN ED: Medications  levETIRAcetam (KEPPRA) tablet 500 mg (has no administration in time range)  melatonin tablet 5 mg (5 mg Oral Given 10/01/22 2145)  zolpidem (AMBIEN) tablet 2.5 mg (2.5 mg Oral Given 10/01/22 2144)  traMADol (ULTRAM) tablet 50 mg (50 mg Oral  Given 10/01/22 2144)  tamsulosin (FLOMAX) capsule 0.4 mg (0.4 mg Oral Given 10/01/22 2145)  primidone (MYSOLINE) tablet 50 mg (has no administration in time range)  prazosin (MINIPRESS) capsule 10 mg (has no administration in time range)  metoprolol tartrate (LOPRESSOR) tablet 12.5 mg (12.5 mg Oral Given 10/01/22 2140)  finasteride (PROSCAR) tablet 5 mg (has no administration in time range)  albuterol (PROVENTIL) (2.5 MG/3ML) 0.083% nebulizer solution 2.5 mg (has no administration in time range)  insulin aspart (novoLOG) injection 0-5 Units (has no administration in time range)  sodium chloride flush (NS) 0.9 % injection 3 mL (has no administration in time range)  acetaminophen (TYLENOL) tablet 650 mg (has no administration in time range)    Or  acetaminophen (TYLENOL) suppository 650 mg (has no administration in time range)  oxyCODONE (Oxy IR/ROXICODONE) immediate release tablet 5 mg (has no administration in time range)  morphine (PF) 2 MG/ML injection 2 mg (has no administration in time range)  ondansetron (ZOFRAN) tablet 4 mg (has no administration in time range)    Or  ondansetron (ZOFRAN) injection 4 mg (has no administration in time range)  hydrALAZINE (APRESOLINE) injection 5 mg (has no administration in time range)  insulin aspart (novoLOG) injection 0-9 Units (has no administration in time range)  heparin injection 5,000 Units (has no administration in time range)  thiamine (VITAMIN B1) injection 100 mg (has no administration in time range)  iohexol (OMNIPAQUE) 350 MG/ML injection 75 mL (75 mLs Intravenous Contrast Given 10/01/22 1750)  venlafaxine XR (EFFEXOR-XR) 24 hr capsule 150 mg (150 mg Oral Given 10/01/22 2139)     IMPRESSION / MDM / ASSESSMENT AND PLAN / ED COURSE  I reviewed the triage vital signs and the nursing notes.                              Differential diagnosis includes, but is not limited to, pneumonia, pneumothorax, PE, CHF exacerbation, ACS, failure to  thrive.  Patient's presentation is most consistent with acute illness / injury with system symptoms.  Patient is a 77 year old male presenting today for cough, shortness of breath, and worsening weakness.  Patient was found to be 80% on room air and placed on 2 L nasal cannula with improvement in his oxygen.  Was mildly tachypneic on arrival.  Chest x-ray without obvious sign of pneumonia or significant volume overload.  BNP at 104 but not overtly indicative of CHF exacerbation.  Given unsure etiology,  follow-up CTA chest was ordered.  This showed no evidence of pneumonia or PE.  Largely, did not show any volume overload evidence either.  I do not have an obvious indication for why patient is hypoxic today but do feel it necessitates further workup.  Reportedly prior smoker but no wheezing on exam and no smoking within the last 20 years.  No prior diagnosis of COPD but could be what is going on today as well.  EKG reassuring and troponins negative x 2 with low suspicion for ACS.  Patient will be admitted to hospitalist service for further care.  The patient is on the cardiac monitor to evaluate for evidence of arrhythmia and/or significant heart rate changes. Clinical Course as of 10/01/22 2332  Mon Oct 01, 2022  1525 SpO2(!): 88 % Placed on 2L [place] [DW]  1603 CBC(!) Chronic thrombocytopenia present which is overall consistent with his baseline.  No worsening anemia today. [DW]  1754 Troponin I (High Sensitivity): 4 [DW]  1755 B Natriuretic Peptide(!): 104.0 Similar to comparable levels in the past. [DW]  1826 Troponin I (High Sensitivity): 6 Troponin stable with no concern for ACS. [DW]    Clinical Course User Index [DW] Janith Lima, MD     FINAL CLINICAL IMPRESSION(S) / ED DIAGNOSES   Final diagnoses:  Hypoxia  Failure to thrive in adult  Weakness     Rx / DC Orders   ED Discharge Orders     None        Note:  This document was prepared using Dragon voice recognition  software and may include unintentional dictation errors.   Janith Lima, MD 10/01/22 [phone]

## 2022-10-01 NOTE — Assessment & Plan Note (Signed)
Supplemental oxygen for o2 sats with goal of 88% and above.

## 2022-10-01 NOTE — H&P (Signed)
History and Physical    Patient: Alex Gomez BWG:[id number] DOB: November 15, 1945 DOA: 10/01/2022 DOS: the patient was seen and examined on 10/01/2022 PCP: [facility]  Patient coming from: Home  Chief Complaint:  Chief Complaint  Patient presents with   Cough    HPI: Alex Gomez is a 77 y.o. male with medical history significant for COPD/CAD, diabetes, HTN/ AVR/ seizures coming for SOB/DOE and orthopnea,Pt also has depression. And per reports pt has said he wants to die.Pt denies any SI.Wife at bedside. Spoke to  Wife at bedside gives history as patient has hearing difficulty states that patient has been weak over the past few days is no longer able to ambulate his coughing and short of breath.  Last echocardiogram noted a EF at 55%.  Patient does not have a history of heart failure however has had valve replacement and follows with cardiology at the [facility] clinic. Patient currently is alert awake oriented in no distress not using accessory muscles of respiration currently on nasal cannula. Initial vitals show patient's O2 sats at 88% on room air with his weight of 229.4 pounds, respirations of 22 and blood pressure 149/69.  EKG done today shows sinus rhythm at 69 with normal axis normal intervals no ST-T wave changes.  Metabolic panel shows glucose of 128 sodium 134 normal kidney function hepatic function is added on, BNP elevated at 104 normal troponin.  Respiratory panel  negative for flu RSV and COVID.  Urinalysis is clear and yellow with no leukocytes and nitrate negative. Patient had a CT angio negative for pulmonary embolism and viability basilar atelectasis without effusion no other focal abnormality and aortic atherosclerosis.   Review of Systems: Review of Systems  Respiratory:  Positive for cough, shortness of breath and wheezing.   Cardiovascular:  Positive for leg swelling.  All other systems reviewed and are negative.   Past Medical History:  Diagnosis Date   COPD  (chronic obstructive pulmonary disease) (HCC)    Hypercholesterolemia    Seizures (HCC)    History reviewed. No pertinent surgical history. Social History:   reports that he quit smoking about 25 years ago. His smoking use included cigarettes. He started smoking about 55 years ago. He has a 30 pack-year smoking history. He has never used smokeless tobacco. He reports that he does not currently use alcohol. He reports that he does not use drugs.  Allergies  Allergen Reactions   Shellfish Allergy Shortness Of Breath    History reviewed. No pertinent family history.  Prior to Admission medications   Medication Sig Start Date End Date Taking? Authorizing Provider  albuterol (VENTOLIN HFA) 108 (90 Base) MCG/ACT inhaler Inhale 2 puffs into the lungs every 6 (six) hours as needed for wheezing or shortness of breath. 09/27/22  Yes Merwyn Katos, MD  allopurinol (ZYLOPRIM) 300 MG tablet Take 300 mg by mouth daily.   Yes [provider]  aspirin EC 81 MG tablet Take 81 mg by mouth daily.   Yes [provider]  atorvastatin (LIPITOR) 40 MG tablet Take 40 mg by mouth daily.   Yes [provider]  carboxymethylcellulose (REFRESH PLUS) 0.5 % SOLN 1 drop 3 (three) times daily as needed.   Yes [provider]  Cholecalciferol 25 MCG (1000 UT) tablet Take 1,000 Units by mouth daily.   Yes [provider]  clobetasol cream (TEMOVATE) 0.05 % Apply 1 Application topically 2 (two) times daily. 05/20/22  Yes [provider]  coal tar (NEUTROGENA  T-GEL) 0.5 % shampoo Apply 1 Application topically at bedtime as needed. 01/08/22  Yes [provider]  cyanocobalamin (VITAMIN B12) 1000 MCG tablet Take 1 tablet (1,000 mcg total) by mouth daily. 06/04/22  Yes Marrion Coy, MD  desonide (DESOWEN) 0.05 % cream Apply 1 Application topically 2 (two) times daily. 05/21/22  Yes [provider]  ferrous sulfate 325 (65 FE) MG EC tablet Take 1 tablet (325 mg  total) by mouth daily with breakfast. 06/04/22 10/01/22 Yes Marrion Coy, MD  finasteride (PROSCAR) 5 MG tablet Take 5 mg by mouth at bedtime.   Yes [provider]  fluconazole (DIFLUCAN) 200 MG tablet Take 200 mg by mouth once a week. 04/03/22  Yes [provider]  HYDROcodone-acetaminophen (NORCO/VICODIN) 5-325 MG tablet Take 1 tablet by mouth every 4 (four) hours as needed for moderate pain. 08/20/22 08/20/23 Yes Cuthriell, Delorise Royals, PA-C  hydrOXYzine (ATARAX) 10 MG tablet Take 10 mg by mouth daily as needed. 05/20/22  Yes [provider]  ipratropium (ATROVENT) 0.06 % nasal spray Place 2 sprays into both nostrils daily as needed for rhinitis.   Yes [provider]  Ipratropium-Albuterol (COMBIVENT RESPIMAT) 20-100 MCG/ACT AERS respimat Inhale 1 puff into the lungs every 6 (six) hours as needed for wheezing or shortness of breath.   Yes [provider]  ketoconazole (NIZORAL) 2 % cream Apply 1 application topically daily. Apply 1 application to scales on sides of nose 09/11/19  Yes [provider]  levETIRAcetam (KEPPRA) 500 MG tablet Take 500 mg by mouth every morning. 1 tablet (500mg ) qam and 1/2 tablet (250mg ) in PM. Verified with pt's wife and using pill container on 06/01/22   Yes [provider]  lidocaine (LIDODERM) 5 % Place 1 patch onto the skin every 12 (twelve) hours. Remove & Discard patch within 12 hours or as directed by MD 09/26/22 09/26/23 Yes Willy Eddy, MD  melatonin 5 MG TABS Take 5 mg by mouth at bedtime as needed.   Yes [provider]  metoprolol tartrate (LOPRESSOR) 25 MG tablet Take 12.5 mg by mouth 2 (two) times daily.   Yes [provider]  Multiple Vitamins-Minerals (MULTIVITAMIN WITH MINERALS) tablet Take 1 tablet by mouth daily.   Yes [provider]  oxyCODONE-acetaminophen (PERCOCET) 5-325 MG tablet Take 1 tablet by mouth every 4 (four) hours as needed for severe pain. 09/26/22  09/26/23 Yes Willy Eddy, MD  prazosin (MINIPRESS) 5 MG capsule Take 10 mg by mouth at bedtime.   Yes [provider]  primidone (MYSOLINE) 50 MG tablet Take 50 mg by mouth 2 (two) times daily. 05/23/22  Yes [provider]  tamsulosin (FLOMAX) 0.4 MG CAPS capsule Take 0.4 mg by mouth daily. bedtime   Yes [provider]  traMADol (ULTRAM) 50 MG tablet Take 50 mg by mouth every 6 (six) hours as needed. 03/04/22  Yes [provider]  triamcinolone ointment (KENALOG) 0.1 % Apply 1 Application topically 2 (two) times daily. 12/03/11  Yes [provider]  venlafaxine XR (EFFEXOR-XR) 150 MG 24 hr capsule Take 450 mg by mouth daily.   Yes [provider]  zolpidem (AMBIEN) 5 MG tablet Take 2.5 mg by mouth at bedtime as needed. 04/24/22  Yes [provider]  colchicine 0.6 MG tablet Take 1 tablet (0.6 mg total) by mouth 2 (two) times daily for 10 days. 08/20/22 08/30/22  Orvil Feil, PA-C  levETIRAcetam (KEPPRA) 500 MG tablet Take 250 mg by mouth every evening.  1 tablet (500mg ) qam and 1/2 tablet (250mg ) in PM. Verified with pt's wife and using pill container on 06/01/22 Patient not taking: Reported on 10/01/2022    [provider]  Spacer/Aero-Holding Chambers (AEROCHAMBER MV) inhaler Use as instructed 09/27/22   Merwyn Katos, MD     Vitals:   10/01/22 1304 10/01/22 1515 10/01/22 1730 10/01/22 1858  BP:  135/62 (!) 118/57   Pulse:  64 72   Resp:  19 (!) 21   Temp:    98.2 F (36.8 C)  TempSrc:    Oral  SpO2:  98% 93%   Weight: 104 kg     Height: 6\' 1"  (1.854 m)      Physical Exam Vitals and nursing note reviewed.  Constitutional:      General: He is not in acute distress.    Interventions: Nasal cannula in place.  HENT:     Head: Normocephalic and atraumatic.     Right Ear: Hearing normal.     Left Ear: Hearing normal.     Nose: Nose normal. No nasal deformity.     Mouth/Throat:     Lips: Pink.     Tongue: No  lesions.     Pharynx: Oropharynx is clear.  Eyes:     General: Lids are normal.     Extraocular Movements: Extraocular movements intact.  Cardiovascular:     Rate and Rhythm: Normal rate and regular rhythm.     Pulses:          Dorsalis pedis pulses are 2+ on the right side and 2+ on the left side.       Posterior tibial pulses are 2+ on the right side and 2+ on the left side.     Heart sounds: Murmur heard.     Systolic murmur is present with a grade of 3/6.  Pulmonary:     Effort: Pulmonary effort is normal.     Breath sounds: Examination of the right-middle field reveals rales. Examination of the left-middle field reveals rales. Examination of the right-lower field reveals rales. Examination of the left-lower field reveals rales. Rales present.  Abdominal:     General: Bowel sounds are normal. There is no distension.     Palpations: Abdomen is soft. There is no mass.     Tenderness: There is no abdominal tenderness.  Musculoskeletal:     Right lower leg: 2+ Edema present.     Left lower leg: 2+ Edema present.  Skin:    General: Skin is warm and dry.     Findings: Rash present.     Comments: Psoriarsis.  Neurological:     General: No focal deficit present.     Mental Status: He is alert and oriented to person, place, and time.     Cranial Nerves: Cranial nerves 2-12 are intact.  Psychiatric:        Attention and Perception: Attention normal.        Mood and Affect: Mood normal.        Speech: Speech normal.        Behavior: Behavior normal. Behavior is cooperative.     Labs on Admission: I have personally reviewed following labs and imaging studies  CBC: Recent Labs  Lab 09/26/22 1309 09/27/22 1154 10/01/22 1306  WBC 10.4 10.3 5.4  HGB 12.0* 11.6* 11.7*  HCT 38.8* 38.2* 37.2*  MCV 82.7 83.6 80.0  PLT 80* 75* 62*   Basic Metabolic Panel: Recent Labs  Lab 09/26/22 1309 09/27/22  1154 10/01/22 1306  NA 134* 133* 134*  K 4.4 5.0 4.5  CL 101 98 97*  CO2 28 28  30   GLUCOSE 113* 117* 128*  BUN 29* 29* 18  CREATININE 0.89 0.99 0.84  CALCIUM 8.6* 9.0 8.3*   GFR: Estimated Creatinine Clearance: 96.2 mL/min (by C-G formula based on SCr of 0.84 mg/dL). Liver Function Tests: Recent Labs  Lab 09/26/22 1309 09/27/22 1154  AST 22 14*  ALT 53* 40  ALKPHOS 56 57  BILITOT 0.4 0.3  PROT 8.4* 8.3*  ALBUMIN 2.6* 2.5*   Recent Labs  Lab 09/26/22 1309  LIPASE 21   No results for input(s): "AMMONIA" in the last 168 hours. Coagulation Profile: No results for input(s): "INR", "PROTIME" in the last 168 hours. Cardiac Enzymes: No results for input(s): "CKTOTAL", "CKMB", "CKMBINDEX", "TROPONINI" in the last 168 hours. BNP (last 3 results) No results for input(s): "PROBNP" in the last 8760 hours. HbA1C: No results for input(s): "HGBA1C" in the last 72 hours. CBG: No results for input(s): "GLUCAP" in the last 168 hours. Lipid Profile: No results for input(s): "CHOL", "HDL", "LDLCALC", "TRIG", "CHOLHDL", "LDLDIRECT" in the last 72 hours. Thyroid Function Tests: No results for input(s): "TSH", "T4TOTAL", "FREET4", "T3FREE", "THYROIDAB" in the last 72 hours. Anemia Panel: No results for input(s): "VITAMINB12", "FOLATE", "FERRITIN", "TIBC", "IRON", "RETICCTPCT" in the last 72 hours. Urinalysis    Component Value Date/Time   COLORURINE YELLOW (A) 09/26/2022 1752   APPEARANCEUR CLEAR (A) 09/26/2022 1752   LABSPEC >1.046 (H) 09/26/2022 1752   PHURINE 5.0 09/26/2022 1752   GLUCOSEU NEGATIVE 09/26/2022 1752   HGBUR NEGATIVE 09/26/2022 1752   BILIRUBINUR NEGATIVE 09/26/2022 1752   KETONESUR NEGATIVE 09/26/2022 1752   PROTEINUR NEGATIVE 09/26/2022 1752   NITRITE NEGATIVE 09/26/2022 1752   LEUKOCYTESUR NEGATIVE 09/26/2022 1752   Unresulted Labs (From admission, onward)     Start     Ordered   10/01/22 2141  Hepatic function panel  Add-on,   AD        10/01/22 2143           Medications  levETIRAcetam (KEPPRA) tablet 500 mg (has no  administration in time range)  melatonin tablet 5 mg (5 mg Oral Given 10/01/22 2145)  zolpidem (AMBIEN) tablet 2.5 mg (has no administration in time range)  traMADol (ULTRAM) tablet 50 mg (50 mg Oral Given 10/01/22 2144)  tamsulosin (FLOMAX) capsule 0.4 mg (0.4 mg Oral Given 10/01/22 2145)  primidone (MYSOLINE) tablet 50 mg (has no administration in time range)  prazosin (MINIPRESS) capsule 10 mg (has no administration in time range)  metoprolol tartrate (LOPRESSOR) tablet 12.5 mg (12.5 mg Oral Given 10/01/22 2140)  finasteride (PROSCAR) tablet 5 mg (has no administration in time range)  albuterol (PROVENTIL) (2.5 MG/3ML) 0.083% nebulizer solution 2.5 mg (has no administration in time range)  iohexol (OMNIPAQUE) 350 MG/ML injection 75 mL (75 mLs Intravenous Contrast Given 10/01/22 1750)  venlafaxine XR (EFFEXOR-XR) 24 hr capsule 150 mg (150 mg Oral Given 10/01/22 2139)    Radiological Exams on Admission: CT Angio Chest PE W and/or Wo Contrast  Result Date: 10/01/2022 CLINICAL DATA:  New onset hypoxia with cough, initial encounter EXAM: CT ANGIOGRAPHY CHEST WITH CONTRAST TECHNIQUE: Multidetector CT imaging of the chest was performed using the standard protocol during bolus administration of intravenous contrast. Multiplanar CT image reconstructions and MIPs were obtained to evaluate the vascular anatomy. RADIATION DOSE REDUCTION: This exam was performed according to the departmental dose-optimization program which includes automated exposure control,  adjustment of the mA and/or kV according to patient size and/or use of iterative reconstruction technique. CONTRAST:  75mL OMNIPAQUE IOHEXOL 350 MG/ML SOLN COMPARISON:  Chest x-ray from earlier in the same day. FINDINGS: Cardiovascular: Atherosclerotic calcifications of the thoracic aorta are noted. No aneurysmal dilatation is seen. Heavy coronary calcifications are noted. The pulmonary artery shows a normal branching pattern bilaterally. No filling defect to  suggest pulmonary embolism is seen. Mediastinum/Nodes: Thoracic inlet is within normal limits. No sizable hilar or mediastinal adenopathy is noted. The esophagus as visualized is within normal limits. Lungs/Pleura: Bibasilar atelectatic changes are noted. No sizable effusion is seen. No parenchymal nodule is noted. Upper Abdomen: Visualized upper abdomen is within normal limits. Musculoskeletal: Degenerative changes of the thoracic spine are noted. No acute rib abnormality is noted. Review of the MIP images confirms the above findings. IMPRESSION: No evidence of pulmonary emboli. Bibasilar atelectasis without effusion. No other focal abnormality is noted. Aortic Atherosclerosis (ICD10-I70.0). Electronically Signed   By: Alcide Clever M.D.   On: 10/01/2022 20:50   DG Chest 2 View  Result Date: 10/01/2022 CLINICAL DATA:  Cough for 1 week. EXAM: CHEST - 2 VIEW COMPARISON:  May 31, 2022. FINDINGS: Stable cardiomediastinal silhouette. Status post coronary bypass graft. Mild bilateral midlung subsegmental atelectasis or scarring is noted. Minimal right pleural effusion is noted. Bony thorax is unremarkable. IMPRESSION: Mild bilateral midlung subsegmental atelectasis or scarring is noted which is slightly more prominent compared to prior exam. Minimal right pleural effusion. Electronically Signed   By: Lupita Raider M.D.   On: 10/01/2022 16:28     Data Reviewed: Relevant notes from primary care and specialist visits, past discharge summaries as available in EHR, including Care Everywhere. Prior diagnostic testing as pertinent to current admission diagnoses Updated medications and problem lists for reconciliation ED course, including vitals, labs, imaging, treatment and response to treatment Triage notes, nursing and pharmacy notes and ED provider's notes Notable results as noted in HPI  Assessment & Plan SOB (shortness of breath) Pt coming with SOB/ DOE / respiratory failure and hypoxia with SpO2: 93 % on     CTA negative and suspect from new CHF.  Acute respiratory failure with hypoxia (HCC) Supplemental oxygen for o2 sats with goal of 88% and above.  CAD (coronary artery disease) H/o CAD we will follow Troponin and  obtain 2 d echo. C Cardiology consulted fro New CHF pt has AVR replacement. Strict I/O.  Lasix 20 m g iv q12h once ready.  Benign essential hypertension Vitals:   10/01/22 1301 10/01/22 1515 10/01/22 1730  BP: (!) 149/69 135/62 (!) 118/57  Stable we will cont lopressor/ prazosin.  Type 2 diabetes mellitus without complication (HCC) Sliding scale regimen.   Seizure [facility]) Aspiration/ seizure precaution and cont keppra and Stop tramadol.   Alcohol abuse Ethanol level. CIWA. Thiamine 100 mg  Obstructive sleep apnea of adult CPAP per home settings.  Iron deficiency anemia    Latest Ref Rng & Units 10/01/2022    1:06 PM 09/27/2022   11:54 AM 09/26/2022    1:09 PM  CBC  WBC 4.0 - 10.5 K/uL 5.4  10.3  10.4   Hemoglobin 13.0 - 17.0 g/dL 86.5  78.4  69.6   Hematocrit 39.0 - 52.0 % 37.2  38.2  38.8   Platelets 150 - 400 K/uL 62  75  80   Type/ screen.  DVT prophylaxis:  Heparin  q 12  Consults:  Cardiology; [facility] message .   Advance Care  Planning:    Code Status: Prior   Family Communication:  Wife at bedside.   Disposition Plan:  Home   Severity of Illness: The appropriate patient status for this patient is INPATIENT. Inpatient status is judged to be reasonable and necessary in order to provide the required intensity of service to ensure the patient's safety. The patient's presenting symptoms, physical exam findings, and initial radiographic and laboratory data in the context of their chronic comorbidities is felt to place them at high risk for further clinical deterioration. Furthermore, it is not anticipated that the patient will be medically stable for discharge from the hospital within 2 midnights of admission.   * I certify that at the point of admission  it is my clinical judgment that the patient will require inpatient hospital care spanning beyond 2 midnights from the point of admission due to high intensity of service, high risk for further deterioration and high frequency of surveillance required.*  Author: Gertha Calkin, MD 10/01/2022 10:20 PM  For on call review [email or web address].

## 2022-10-02 ENCOUNTER — Inpatient Hospital Stay: Payer: No Typology Code available for payment source

## 2022-10-02 DIAGNOSIS — R0602 Shortness of breath: Secondary | ICD-10-CM | POA: Diagnosis not present

## 2022-10-02 LAB — CBC WITH DIFFERENTIAL/PLATELET
Abs Immature Granulocytes: 0.1 10*3/uL — ABNORMAL HIGH (ref 0.00–0.07)
Basophils Absolute: 0.1 10*3/uL (ref 0.0–0.1)
Basophils Relative: 1 %
Eosinophils Absolute: 0.2 10*3/uL (ref 0.0–0.5)
Eosinophils Relative: 3 %
HCT: 33.8 % — ABNORMAL LOW (ref 39.0–52.0)
Hemoglobin: 10.9 g/dL — ABNORMAL LOW (ref 13.0–17.0)
Immature Granulocytes: 1 %
Lymphocytes Relative: 16 %
Lymphs Abs: 1.2 10*3/uL (ref 0.7–4.0)
MCH: 25.4 pg — ABNORMAL LOW (ref 26.0–34.0)
MCHC: 32.2 g/dL (ref 30.0–36.0)
MCV: 78.8 fL — ABNORMAL LOW (ref 80.0–100.0)
Monocytes Absolute: 0.3 10*3/uL (ref 0.1–1.0)
Monocytes Relative: 4 %
Neutro Abs: 6 10*3/uL (ref 1.7–7.7)
Neutrophils Relative %: 75 %
Platelets: 66 10*3/uL — ABNORMAL LOW (ref 150–400)
RBC: 4.29 MIL/uL (ref 4.22–5.81)
RDW: 20 % — ABNORMAL HIGH (ref 11.5–15.5)
WBC: 7.9 10*3/uL (ref 4.0–10.5)
nRBC: 0 % (ref 0.0–0.2)

## 2022-10-02 LAB — HEMOGLOBIN A1C
Hgb A1c MFr Bld: 7.5 % — ABNORMAL HIGH (ref 4.8–5.6)
Mean Plasma Glucose: 168.55 mg/dL

## 2022-10-02 LAB — BLOOD GAS, VENOUS
Acid-Base Excess: 6.4 mmol/L — ABNORMAL HIGH (ref 0.0–2.0)
Bicarbonate: 31.9 mmol/L — ABNORMAL HIGH (ref 20.0–28.0)
O2 Saturation: 99.9 %
Patient temperature: 37
pCO2, Ven: 48 mm[Hg] (ref 44–60)
pH, Ven: 7.43 (ref 7.25–7.43)
pO2, Ven: 133 mm[Hg] — ABNORMAL HIGH (ref 32–45)

## 2022-10-02 LAB — COMPREHENSIVE METABOLIC PANEL
ALT: 66 U/L — ABNORMAL HIGH (ref 0–44)
AST: 33 U/L (ref 15–41)
Albumin: 2.1 g/dL — ABNORMAL LOW (ref 3.5–5.0)
Alkaline Phosphatase: 56 U/L (ref 38–126)
Anion gap: 6 (ref 5–15)
BUN: 17 mg/dL (ref 8–23)
CO2: 28 mmol/L (ref 22–32)
Calcium: 7.8 mg/dL — ABNORMAL LOW (ref 8.9–10.3)
Chloride: 100 mmol/L (ref 98–111)
Creatinine, Ser: 0.82 mg/dL (ref 0.61–1.24)
GFR, Estimated: >60 mL/min (ref >60)
Glucose, Bld: 153 mg/dL — ABNORMAL HIGH (ref 70–99)
Potassium: 3.9 mmol/L (ref 3.5–5.1)
Sodium: 134 mmol/L — ABNORMAL LOW (ref 135–145)
Total Bilirubin: 0.3 mg/dL (ref 0.3–1.2)
Total Protein: 7.7 g/dL (ref 6.5–8.1)

## 2022-10-02 LAB — GLUCOSE, CAPILLARY: Glucose-Capillary: 113 mg/dL — ABNORMAL HIGH (ref 70–99)

## 2022-10-02 LAB — HEPATIC FUNCTION PANEL
ALT: 57 U/L — ABNORMAL HIGH (ref 0–44)
AST: 29 U/L (ref 15–41)
Albumin: 2.3 g/dL — ABNORMAL LOW (ref 3.5–5.0)
Alkaline Phosphatase: 55 U/L (ref 38–126)
Bilirubin, Direct: <0.1 mg/dL (ref 0.0–0.2)
Total Bilirubin: 0.1 mg/dL — ABNORMAL LOW (ref 0.3–1.2)
Total Protein: 7.9 g/dL (ref 6.5–8.1)

## 2022-10-02 LAB — CBG MONITORING, ED
Glucose-Capillary: 103 mg/dL — ABNORMAL HIGH (ref 70–99)
Glucose-Capillary: 113 mg/dL — ABNORMAL HIGH (ref 70–99)
Glucose-Capillary: 131 mg/dL — ABNORMAL HIGH (ref 70–99)
Glucose-Capillary: 152 mg/dL — ABNORMAL HIGH (ref 70–99)

## 2022-10-02 MED ORDER — LEVETIRACETAM 250 MG PO TABS
250.0000 mg | ORAL_TABLET | Freq: Every day | ORAL | Status: DC
  Administered 2022-10-02 – 2022-10-09 (×8): 250 mg via ORAL
  Filled 2022-10-02 (×10): qty 1

## 2022-10-02 MED ORDER — LEVETIRACETAM 500 MG PO TABS
500.0000 mg | ORAL_TABLET | Freq: Every day | ORAL | Status: DC
  Administered 2022-10-02: 250 mg via ORAL
  Administered 2022-10-03 – 2022-10-10 (×8): 500 mg via ORAL
  Filled 2022-10-02 (×8): qty 1

## 2022-10-02 MED ORDER — ATORVASTATIN CALCIUM 20 MG PO TABS
20.0000 mg | ORAL_TABLET | Freq: Every day | ORAL | Status: DC
  Administered 2022-10-02 – 2022-10-10 (×9): 20 mg via ORAL
  Filled 2022-10-02 (×9): qty 1

## 2022-10-02 MED ORDER — MOMETASONE FURO-FORMOTEROL FUM 200-5 MCG/ACT IN AERO
2.0000 | INHALATION_SPRAY | Freq: Two times a day (BID) | RESPIRATORY_TRACT | Status: DC
  Administered 2022-10-02 – 2022-10-09 (×12): 2 via RESPIRATORY_TRACT
  Filled 2022-10-02 (×2): qty 8.8

## 2022-10-02 MED ORDER — ASPIRIN 81 MG PO TBEC
81.0000 mg | DELAYED_RELEASE_TABLET | Freq: Every day | ORAL | Status: DC
  Administered 2022-10-02 – 2022-10-09 (×9): 81 mg via ORAL
  Filled 2022-10-02 (×9): qty 1

## 2022-10-02 MED ORDER — FUROSEMIDE 10 MG/ML IJ SOLN
20.0000 mg | Freq: Once | INTRAMUSCULAR | Status: AC
  Administered 2022-10-02: 20 mg via INTRAVENOUS
  Filled 2022-10-02: qty 4

## 2022-10-02 MED ORDER — IPRATROPIUM-ALBUTEROL 0.5-2.5 (3) MG/3ML IN SOLN
3.0000 mL | Freq: Four times a day (QID) | RESPIRATORY_TRACT | Status: AC
  Administered 2022-10-02 (×2): 3 mL via RESPIRATORY_TRACT
  Filled 2022-10-02: qty 3

## 2022-10-02 MED ORDER — VENLAFAXINE HCL ER 75 MG PO CP24
150.0000 mg | ORAL_CAPSULE | Freq: Every day | ORAL | Status: DC
  Administered 2022-10-03 – 2022-10-10 (×8): 150 mg via ORAL
  Filled 2022-10-02 (×8): qty 2

## 2022-10-02 NOTE — Consult Note (Signed)
[facility]rology Consult Services   Patient Name:   Alex Gomez, Alex Gomez Date of Birth:   1945/03/27 Identification Number:   MRN - [id number] Date of Service:   10/02/2022 00:30:18  Diagnosis:       F05.8 - Other delirium  Impression:      77 year old male who is admitted to the hospital because of hypoxia who had an episode of aphasia. Presentation concerning for delirium due to polypharmacy. Given history of seizures consider EEG if patient continues to have fluctuating symptoms.  Our recommendations are outlined below.  Recommendations:        Neuro Checks       Bedside Swallow Eval       DVT Prophylaxis       IV Fluids, Normal Saline       Initiate or continue Aspirin 81 MG daily       Continue home Keppra starting tonight.  Sign Out:       Discussed with Primary Attending    ------------------------------------------------------------------------------  Advanced Imaging: Advanced Imaging Deferred because:  Non-disabling symptoms as verified by the patient; no cortical signs so not consistent with LVO   Metrics: Last Known Well: 10/01/2022 22:45:00 [facility] Notification Time: 10/02/2022 00:30:17 Stamp Time: 10/02/2022 00:30:18 Initial Response Time: 10/02/2022 00:33:06 Symptoms: aphasia. Initial patient interaction: 10/02/2022 00:36:06 NIHSS Assessment Completed: 10/02/2022 00:40:00 Patient is not a candidate for Thrombolytic. Thrombolytic Medical Decision: 10/02/2022 00:40:00 Patient was not deemed candidate for Thrombolytic because of following reasons: Resolved symptoms .  CT head showed no acute hemorrhage or acute core infarct.  Primary Provider Notified of Diagnostic Impression and Management Plan on: 10/02/2022 00:49:10    ------------------------------------------------------------------------------  History of Present Illness: Patient is a 77 year old Male.  77 year old male with a history of seizures, CAD, CHF,  DM, and dementia who is admitted to the hospital because of hypoxia. This evening he was at his normal state of health at 22:45 and then around 23:40 patient was sleeping but when his nurse went to wake him patient seemed to be aphasic. He was unable to answer complex questions for 20-30 min but by the time of the teleneuro evaluation patient was back to baseline. Patient did receive Melatonin, Ambien, and Tramadol at 21:45.   Past Medical History:      Diabetes Mellitus      Coronary Artery Disease      Seizures       Dementia/MCI Other PMH:  CHF  Medications:  No Anticoagulant use  Antiplatelet use: Yes Aspirin Reviewed EMR for current medications  Allergies:  Reviewed  Social History: Drug Use: No  Family History:  There is no family history of premature cerebrovascular disease pertinent to this consultation  ROS : 14 Points Review of Systems was performed and was negative except mentioned in HPI.  Past Surgical History: There Is No Surgical History Contributory To Today's Visit    Examination: BP(138/62), Pulse(74), 1A: Level of Consciousness - Alert; keenly responsive + 0 1B: Ask Month and Age - Both Questions Right + 0 1C: Blink Eyes & Squeeze Hands - Performs Both Tasks + 0 2: Test Horizontal Extraocular Movements - Normal + 0 3: Test Visual Fields - No Visual Loss + 0 4: Test Facial Palsy (Use Grimace if Obtunded) - Normal symmetry + 0 5A: Test Left Arm Motor Drift - No Drift for 10 Seconds + 0 5B: Test Right Arm Motor Drift - No Drift for 10 Seconds + 0 6A: Test Left  Leg Motor Drift - No Drift for 5 Seconds + 0 6B: Test Right Leg Motor Drift - No Drift for 5 Seconds + 0 7: Test Limb Ataxia (FNF/Heel-Shin) - No Ataxia + 0 8: Test Sensation - Normal; No sensory loss + 0 9: Test Language/Aphasia - Normal; No aphasia + 0 10: Test Dysarthria - Normal + 0 11: Test Extinction/Inattention - No abnormality + 0  NIHSS Score: 0   Pre-Morbid Modified Rankin  Scale: 3 Points = Moderate disability; requiring some help, but able to walk without assistance  Spoke with : Dr. Allena Katz  This consult was conducted in real time using interactive audio and Immunologist. Patient was informed of the technology being used for this visit and agreed to proceed. Patient located in hospital and provider located at home/office setting.   Patient is being evaluated for possible acute neurologic impairment and high probability of imminent or life-threatening deterioration. I spent total of 30 minutes providing care to this patient, including time for face to face visit via telemedicine, review of medical records, imaging studies and discussion of findings with providers, the patient and/or family.   Dr Joice Lofts   [facility] For Inpatient follow-up with [facility] physician please call [facility] [phone]. This is not an outpatient service. Post hospital discharge, please contact hospital directly.  Please do not communicate with [facility] physicians via secure chat. If you have any questions, Please contact [facility]. Please call or reconsult our service if there are any clinical or diagnostic changes.

## 2022-10-02 NOTE — Progress Notes (Signed)
Progress Note   Patient: Alex Gomez DOB: [birth year] DOA: 10/01/2022     1 DOS: the patient was seen and examined on 10/02/2022   Brief hospital course:  Alex Gomez is a 77 y.o. male with medical history significant for COPD/CAD, diabetes, HTN/ AVR/ seizures coming for SOB/DOE and orthopnea,Pt also has depression. And per reports pt has said he wants to die.Pt denies any SI.Wife at bedside. Spoke to  Wife at bedside gives history as patient has hearing difficulty states that patient has been weak over the past few days is no longer able to ambulate his coughing and short of breath.  Last echocardiogram noted a EF at 55%.  Patient does not have a history of heart failure however has had valve replacement and follows with cardiology at the [facility]. Patient currently is alert awake oriented in no distress not using accessory muscles of respiration currently on nasal cannula. Initial vitals show patient's O2 sats at 88% on room air with his weight of 229.4 pounds, respirations of 22 and blood pressure 149/69.  EKG done today shows sinus rhythm at 69 with normal axis normal intervals no ST-T wave changes.  Metabolic panel shows glucose of 128 sodium 134 normal kidney function hepatic function is added on, BNP elevated at 104 normal troponin.  Respiratory panel  negative for flu RSV and COVID.  Urinalysis is clear and yellow with no leukocytes and nitrate negative. Patient had a CT angio negative for pulmonary embolism and viability basilar atelectasis without effusion no other focal abnormality and aortic atherosclerosis.  Assessment and Plan:  Acute hypoxic respiratory failure Unclear etiology and may be secondary to possible acute COPD exacerbation No known history of CHF.  Noted to have bilateral lower extremity swelling, exertional shortness of breath Obtain 2D echocardiogram to assess LVEF.  Cardiology consult CT angiogram showed no evidence of pulmonary emboli. Bibasilar  atelectasis without effusion. Patient had room air pulse oximetry of 88% and is currently on 2 L of oxygen Will need to be assessed for home oxygen prior to discharge    TIA rule out CVA Had an episode of aphasia that resolved Initial CT scan of the head was negative for bleed MRI of the brain shows no acute intracranial abnormality. Mild chronic small vessel ischemic disease with small chronic left frontal, left parietal, and bilateral cerebellar infarcts. Continue aspirin and statins   COPD with acute exacerbation Patient with complaints of worsening shortness of breath from his baseline and also has a wet sounding cough Place patient on scheduled and as needed bronchodilator therapy Place patient antitussives Start inhaled steroid    Diabetes mellitus Maintain consistent carbohydrate diet Control sliding scale insulin    Thrombocytopenia No evidence of bleeding Most likely related to history of alcohol abuse    Seizure disorder Continue Keppra   History of alcohol abuse Monitor closely for signs and symptoms of alcohol withdrawal Place patient on CIWA protocol and administer lorazepam for CIWA score of 8 or greater     Subjective: Patient is seen and examined at the bedside.  No new complaints.  Physical Exam: Vitals:   10/02/22 1100 10/02/22 1200 10/02/22 1300 10/02/22 1402  BP: (!) 125/59 (!) 148/71 (!) 143/65   Pulse: 75 78 66   Resp:  (!) 21 16   Temp:    98.5 F (36.9 C)  TempSrc:    Oral  SpO2: 96% 93% 95%   Weight:      Height:  Vitals and nursing note reviewed.  Constitutional:      General: He is not in acute distress.    Interventions: Nasal cannula in place.  HENT:     Head: Normocephalic and atraumatic.     Right Ear: Hearing normal.     Left Ear: Hearing normal.     Nose: Nose normal. No nasal deformity.     Mouth/Throat:     Lips: Pink.     Tongue: No lesions.     Pharynx: Oropharynx is clear.  Eyes:     General: Lids are  normal.     Extraocular Movements: Extraocular movements intact.  Cardiovascular:     Rate and Rhythm: Normal rate and regular rhythm.     Pulses:          Dorsalis pedis pulses are 2+ on the right side and 2+ on the left side.       Posterior tibial pulses are 2+ on the right side and 2+ on the left side.     Heart sounds: Murmur heard.     Systolic murmur is present with a grade of 3/6.  Pulmonary:     Effort: Pulmonary effort is normal.     Breath sounds: Examination of the right-middle field reveals rales. Examination of the left-middle field reveals rales. Examination of the right-lower field reveals rales. Examination of the left-lower field reveals rales. Rales present.  Abdominal:     General: Bowel sounds are normal. There is no distension.     Palpations: Abdomen is soft. There is no mass.     Tenderness: There is no abdominal tenderness.  Musculoskeletal:     Right lower leg: 2+ Edema present.     Left lower leg: 2+ Edema present.  Skin:    General: Skin is warm and dry.     Findings: Rash present.     Comments: Psoriarsis.  Neurological:     General: No focal deficit present.     Mental Status: He is alert and oriented to person, place, and time.     Cranial Nerves: Cranial nerves 2-12 are intact.  Psychiatric:        Attention and Perception: Attention normal.        Mood and Affect: Mood normal.        Speech: Speech normal.        Behavior: Behavior normal. Behavior is cooperative.       Data Reviewed: Labs reviewed platelet count 66, hemoglobin 10.9, hemoglobin A1c 7.5 There are no new results to review at this time.  Family Communication: Plan of care discussed with patient in detail. Her verbalizes understanding and agrees with the plan  Disposition: Status is: Inpatient Remains inpatient appropriate because: Workup for acute respiratory failure  Planned Discharge Destination:  TBD    Time spent: 35 minutes  Author: Lucile Shutters, MD 10/02/2022  3:17 PM  For on call review [email or web address].

## 2022-10-02 NOTE — Consult Note (Signed)
[facility] CARDIOLOGY CONSULT NOTE       Patient ID: Alex Gomez MRN: [id number] DOB/AGE: Apr 02, 1945 77 y.o.  Admit date: 10/01/2022 Referring Physician Dr. Joylene Igo Primary Physician [facility] Primary Cardiologist Dr. Gwen Pounds (last seen 2019, lost for follow up) Reason for Consultation ?heart failure  HPI: Alex Gomez is a 77 y.o. male  with a past medical history of aortic stenosis s/p AVR at [facility] 2019, coronary artery disease by CT scan, hypertension, hyperlipidemia, diabetes, seizures, depression who presented to the ED on 10/01/2022 for cough, shortness of breath. Cardiology was consulted for further evaluation.   Patient seen and examined this morning.  He and his wife report that for the last week he has had worsening weakness and a productive cough.  Also reports that he has been "slower" for the last 1 month.  Wife reports that he has been struggling with simply getting out of his chair and standing.  Has been using a walker at home.  Reports progressive dyspnea.  Denies any chest pain, syncope, lightheadedness.  Had some shortness of breath yesterday but denies any other episodes of shortness of breath at rest prior.  He denies any orthopnea, states that he usually sleeps in a recliner at home.  Given the concern for his progressive weakness he was brought to the ED yesterday for evaluation.  Workup in the ED notable for creatinine 0.84, potassium 4.5, sodium 134, hemoglobin 11.7.  BNP minimally elevated at 104.  Troponins 4 > 6.  EKG normal sinus rhythm, nonacute.  Chest x-ray with minimal right pleural effusion, no evidence of significant edema or effusion on CT scan.   At the time of evaluation the patient was resting comfortably in ED stretcher with wife present at bedside.  He was laying flat in ED bed on room air without any evidence of shortness of breath.  States that overall he feels about the same today as he did yesterday.  His symptoms and gradual functional  decline were discussed in detail.  Patient is a difficult historian, states that he really does not notice shortness of breath so much as generalized weakness and reduced functional capacity.  Review of systems complete and found to be negative unless listed above    Past Medical History:  Diagnosis Date   COPD (chronic obstructive pulmonary disease) (HCC)    Hypercholesterolemia    Seizures (HCC)     History reviewed. No pertinent surgical history.  (Not in a hospital admission)  Social History   Socioeconomic History   Marital status: Married    Spouse name: Not on file   Number of children: Not on file   Years of education: Not on file   Highest education level: Not on file  Occupational History   Not on file  Tobacco Use   Smoking status: Former    Current packs/day: 0.00    Average packs/day: 1 pack/day for 30.0 years (30.0 ttl pk-yrs)    Types: Cigarettes    Start date: 30    Quit date: 1999    Years since quitting: 25.7   Smokeless tobacco: Never  Substance and Sexual Activity   Alcohol use: Not Currently   Drug use: Never   Sexual activity: Not Currently  Other Topics Concern   Not on file  Social History Narrative   Not on file   Social Determinants of Health   Financial Resource Strain: Not on file  Food Insecurity: No Food Insecurity (06/01/2022)   Hunger Vital Sign  Worried About Programme researcher, broadcasting/film/video in the Last Year: Never true    Ran Out of Food in the Last Year: Never true  Transportation Needs: No Transportation Needs (06/01/2022)   PRAPARE - Administrator, Civil Service (Medical): No    Lack of Transportation (Non-Medical): No  Physical Activity: Not on file  Stress: Not on file  Social Connections: Not on file  Intimate Partner Violence: Not At Risk (06/01/2022)   Humiliation, Afraid, Rape, and Kick questionnaire    Fear of Current or Ex-Partner: No    Emotionally Abused: No    Physically Abused: No    Sexually Abused: No     History reviewed. No pertinent family history.   Vitals:   10/02/22 1200 10/02/22 1300 10/02/22 1402 10/02/22 1535  BP: (!) 148/71 (!) 143/65    Pulse: 78 66    Resp: (!) 21 16    Temp:   98.5 F (36.9 C)   TempSrc:   Oral   SpO2: 93% 95%  96%  Weight:      Height:        PHYSICAL EXAM General: Chronically ill-appearing, well nourished, in no acute distress laying flat in hospital bed with wife present at bedside. HEENT: Normocephalic and atraumatic. Neck: No JVD.  Lungs: Normal respiratory effort on room air. Clear bilaterally to auscultation. No wheezes, crackles, rhonchi.  Heart: HRRR. Normal S1 and S2 without gallops.  3/6 systolic ejection murmur. Abdomen: Non-distended appearing.  Msk: Normal strength and tone for age. Extremities: Warm and well perfused. No clubbing, cyanosis. Trace edema.  Neuro: Alert and oriented X 3. Psych: Answers questions appropriately.   Labs: Basic Metabolic Panel: Recent Labs    10/01/22 1306 10/02/22 1020  NA 134* 134*  K 4.5 3.9  CL 97* 100  CO2 30 28  GLUCOSE 128* 153*  BUN 18 17  CREATININE 0.84 0.82  CALCIUM 8.3* 7.8*   Liver Function Tests: Recent Labs    10/01/22 1744 10/02/22 1020  AST 29 33  ALT 57* 66*  ALKPHOS 55 56  BILITOT 0.1* 0.3  PROT 7.9 7.7  ALBUMIN 2.3* 2.1*   No results for input(s): "LIPASE", "AMYLASE" in the last 72 hours. CBC: Recent Labs    10/01/22 1306 10/02/22 1020  WBC 5.4 7.9  NEUTROABS  --  6.0  HGB 11.7* 10.9*  HCT 37.2* 33.8*  MCV 80.0 78.8*  PLT 62* 66*   Cardiac Enzymes: Recent Labs    10/01/22 1537 10/01/22 1722  TROPONINIHS 4 6   BNP: Recent Labs    10/01/22 1537  BNP 104.0*   D-Dimer: No results for input(s): "DDIMER" in the last 72 hours. Hemoglobin A1C: Recent Labs    10/02/22 1020  HGBA1C 7.5*   Fasting Lipid Panel: No results for input(s): "CHOL", "HDL", "LDLCALC", "TRIG", "CHOLHDL", "LDLDIRECT" in the last 72 hours. Thyroid Function Tests: No results  for input(s): "TSH", "T4TOTAL", "T3FREE", "THYROIDAB" in the last 72 hours.  Invalid input(s): "FREET3" Anemia Panel: No results for input(s): "VITAMINB12", "FOLATE", "FERRITIN", "TIBC", "IRON", "RETICCTPCT" in the last 72 hours.   Radiology: MR BRAIN WO CONTRAST  Result Date: 10/02/2022 CLINICAL DATA:  Neuro deficit, acute, stroke suspected.  Aphasia. EXAM: MRI HEAD WITHOUT CONTRAST TECHNIQUE: Multiplanar, multiecho pulse sequences of the brain and surrounding structures were obtained without intravenous contrast. COMPARISON:  Head CT 10/02/2022 and MRI 01/29/2020 FINDINGS: Brain: There is no evidence of an acute infarct, mass, midline shift, or extra-axial fluid collection. Several chronic cerebral  microhemorrhages are again seen. Small T2 hyperintensities in the cerebral white matter have not significantly progressed from the prior MRI and are nonspecific but compatible with mild chronic small vessel ischemic disease. Small chronic cortical infarcts small chronic cortical infarcts in the left frontal and left parietal lobes are unchanged from the prior MRI. Small chronic bilateral cerebellar infarcts are new from the prior MRI. There is moderate cerebral atrophy. Vascular: Major intracranial vascular flow voids are preserved. Skull and upper cervical spine: Unremarkable bone marrow signal. Sinuses/Orbits: Left cataract extraction. Small mucous retention cyst in the right maxillary sinus. Right mastoid effusion. Other: None. IMPRESSION: 1. No acute intracranial abnormality. 2. Mild chronic small vessel ischemic disease with small chronic left frontal, left parietal, and bilateral cerebellar infarcts. Electronically Signed   By: Sebastian Ache M.D.   On: 10/02/2022 14:03   CT HEAD CODE STROKE WO CONTRAST`  Result Date: 10/02/2022 CLINICAL DATA:  Code stroke.  Aphasia EXAM: CT HEAD WITHOUT CONTRAST TECHNIQUE: Contiguous axial images were obtained from the base of the skull through the vertex without  intravenous contrast. RADIATION DOSE REDUCTION: This exam was performed according to the departmental dose-optimization program which includes automated exposure control, adjustment of the mA and/or kV according to patient size and/or use of iterative reconstruction technique. COMPARISON:  06/01/2022 FINDINGS: Brain: There is no mass, hemorrhage or extra-axial collection. The size and configuration of the ventricles and extra-axial CSF spaces are normal. The brain parenchyma is normal, without evidence of acute or chronic infarction. Vascular: No abnormal hyperdensity of the major intracranial arteries or dural venous sinuses. No intracranial atherosclerosis. Skull: The visualized skull base, calvarium and extracranial soft tissues are normal. Sinuses/Orbits: No fluid levels or advanced mucosal thickening of the visualized paranasal sinuses. No mastoid or middle ear effusion. The orbits are normal. ASPECTS [facility] Stroke Program Early CT Score) - Ganglionic level infarction (caudate, lentiform nuclei, internal capsule, insula, M1-M3 cortex): 7 - Supraganglionic infarction (M4-M6 cortex): 3 Total score (0-10 with 10 being normal): 10 IMPRESSION: 1. No acute intracranial abnormality. 2. ASPECTS is 10. Results were called by telephone at the time of interpretation on 10/02/2022 at 12:30 am to provider EKTA PATEL , who verbally acknowledged these results. Electronically Signed   By: Deatra Robinson M.D.   On: 10/02/2022 00:30   CT Angio Chest PE W and/or Wo Contrast  Result Date: 10/01/2022 CLINICAL DATA:  New onset hypoxia with cough, initial encounter EXAM: CT ANGIOGRAPHY CHEST WITH CONTRAST TECHNIQUE: Multidetector CT imaging of the chest was performed using the standard protocol during bolus administration of intravenous contrast. Multiplanar CT image reconstructions and MIPs were obtained to evaluate the vascular anatomy. RADIATION DOSE REDUCTION: This exam was performed according to the departmental  dose-optimization program which includes automated exposure control, adjustment of the mA and/or kV according to patient size and/or use of iterative reconstruction technique. CONTRAST:  75mL OMNIPAQUE IOHEXOL 350 MG/ML SOLN COMPARISON:  Chest x-ray from earlier in the same day. FINDINGS: Cardiovascular: Atherosclerotic calcifications of the thoracic aorta are noted. No aneurysmal dilatation is seen. Heavy coronary calcifications are noted. The pulmonary artery shows a normal branching pattern bilaterally. No filling defect to suggest pulmonary embolism is seen. Mediastinum/Nodes: Thoracic inlet is within normal limits. No sizable hilar or mediastinal adenopathy is noted. The esophagus as visualized is within normal limits. Lungs/Pleura: Bibasilar atelectatic changes are noted. No sizable effusion is seen. No parenchymal nodule is noted. Upper Abdomen: Visualized upper abdomen is within normal limits. Musculoskeletal: Degenerative changes of the thoracic spine  are noted. No acute rib abnormality is noted. Review of the MIP images confirms the above findings. IMPRESSION: No evidence of pulmonary emboli. Bibasilar atelectasis without effusion. No other focal abnormality is noted. Aortic Atherosclerosis (ICD10-I70.0). Electronically Signed   By: Alcide Clever M.D.   On: 10/01/2022 20:50   DG Chest 2 View  Result Date: 10/01/2022 CLINICAL DATA:  Cough for 1 week. EXAM: CHEST - 2 VIEW COMPARISON:  May 31, 2022. FINDINGS: Stable cardiomediastinal silhouette. Status post coronary bypass graft. Mild bilateral midlung subsegmental atelectasis or scarring is noted. Minimal right pleural effusion is noted. Bony thorax is unremarkable. IMPRESSION: Mild bilateral midlung subsegmental atelectasis or scarring is noted which is slightly more prominent compared to prior exam. Minimal right pleural effusion. Electronically Signed   By: Lupita Raider M.D.   On: 10/01/2022 16:28   DG Abd Portable 1 View  Result Date:  09/27/2022 CLINICAL DATA:  Constipation left flank pain EXAM: PORTABLE ABDOMEN - 1 VIEW COMPARISON:  CT abdomen 09/26/2022 FINDINGS: Prominent stool throughout the colon favors mild constipation. Residual contrast medium in the urinary bladder. Speckled calcifications over the kidneys, more visible on the right than the left, compatible with nonobstructive renal calculi as shown on recent CT. Indistinct bandlike airspace opacity at the lung bases compatible with atelectasis and potentially small pleural effusions. Thoracolumbar spondylosis.  Mild degenerative hip arthropathy. IMPRESSION: 1. Prominent stool throughout the colon favors mild constipation. 2. Bilateral nonobstructive nephrolithiasis. 3. Indistinct bandlike airspace opacity at the lung bases compatible with atelectasis and potentially small pleural effusions. Electronically Signed   By: Gaylyn Rong M.D.   On: 09/27/2022 14:39   CT ABDOMEN PELVIS W CONTRAST  Result Date: 09/26/2022 CLINICAL DATA:  Abdominal/flank pain, stone suspected. Left-sided flank pain. EXAM: CT ABDOMEN AND PELVIS WITH CONTRAST TECHNIQUE: Multidetector CT imaging of the abdomen and pelvis was performed using the standard protocol following bolus administration of intravenous contrast. RADIATION DOSE REDUCTION: This exam was performed according to the departmental dose-optimization program which includes automated exposure control, adjustment of the mA and/or kV according to patient size and/or use of iterative reconstruction technique. CONTRAST:  OMNIPAQUE IOHEXOL 300 MG/ML  SOLN COMPARISON:  None Available. FINDINGS: Lower chest: Small bilateral pleural effusions with adjacent atelectasis in the lung bases. TAVR in place. Coronary artery calcifications. Hepatobiliary: No focal liver abnormality is seen. No gallstones, gallbladder wall thickening, or biliary dilatation. Pancreas: Unremarkable. No pancreatic ductal dilatation or surrounding inflammatory changes.  Spleen: Normal in size without focal abnormality. Adrenals/Urinary Tract: Adrenal glands are unremarkable. Bilateral calyceal stones measuring up to 7 mm on the right and 4 mm on the left. No hydronephrosis. Bladder is unremarkable. Stomach/Bowel: Normal stomach and duodenum. No dilated loops of small bowel. Appendix is not visualized. Colon is unremarkable. No bowel wall thickening or surrounding inflammation. Vascular/Lymphatic: Aortic atherosclerosis. No enlarged abdominal or pelvic lymph nodes. Reproductive: Prostate is unremarkable. Other: No abdominal wall hernia or abnormality. No abdominopelvic ascites. Musculoskeletal: No acute or significant osseous findings. IMPRESSION: 1. No acute abnormality in the abdomen or pelvis. 2. Bilateral calyceal stones measuring up to 7 mm on the right and 4 mm on the left. No hydronephrosis. 3. Small bilateral pleural effusions with adjacent atelectasis in the lung bases. Aortic Atherosclerosis (ICD10-I70.0). Electronically Signed   By: Orvan Falconer M.D.   On: 09/26/2022 16:24    ECHO ordered  TELEMETRY reviewed by me 10/02/2022: Sinus rhythm rate 60s  EKG reviewed by me: NSR rate 69 bpm  Data reviewed by  me 10/02/2022: last 24h vitals tele labs imaging I/O ED provider note, admission H&P  Principal Problem:   SOB (shortness of breath) Active Problems:   Benign essential hypertension   CAD (coronary artery disease)   Seizure (HCC)   Acute respiratory failure with hypoxia (HCC)   Type 2 diabetes mellitus without complication (HCC)   Iron deficiency anemia   Alcohol abuse   Obstructive sleep apnea of adult   Respiratory distress    ASSESSMENT AND PLAN:  Alex Gomez is a 78 y.o. male  with a past medical history of aortic stenosis, coronary artery disease by CT scan, hypertension, hyperlipidemia, diabetes, seizures, depression who presented to the ED on 10/01/2022 for cough, shortness of breath. Cardiology was consulted for further evaluation.   #  ?Heart failure # Aortic stenosis s/p AVR 2019 Patient presenting with reported shortness of breath and SpO2 88% requiring supplemental oxygen.  Has history of aortic stenosis and underwent valve replacement with bioprosthetic valve at the [facility] in 2019 per his wife.  Description of symptoms sound more consistent with generalized deconditioning than acute heart failure. -Echo for updated evaluation of heart and valve function. -Will give one-time dose of IV Lasix 20 mg today  # Acute respiratory failure # COPD exacerbation Reported productive cough for 1 week. -Management per primary  # Coronary artery disease s/p DES 2019 Unable to determine location of stents from [place] records.  Patient is without chest pain. -Continue aspirin 81 mg daily and Lipitor 20 mg daily -Continue metoprolol tartrate 12.5 mg twice daily.   This patient's plan of care was discussed and created with Dr. Corky Sing and he is in agreement.  Signed: Gale Journey, PA-C  10/02/2022, 3:57 PM [facility] Card[facility]

## 2022-10-02 NOTE — Evaluation (Addendum)
Occupational Therapy Evaluation Patient Details Name: Alex Gomez MRN: [id number] DOB: 12/02/1945 Today's Date: 10/02/2022   History of Present Illness Alex Gomez is a 77 y.o. male with medical history significant for COPD/CAD, diabetes, HTN/ AVR/ seizures coming for SOB/DOE and orthopnea.  Presentation concerning for delirium due to polypharmacy.   Clinical Impression   Alex Gomez was seen for OT/Alex Gomez evaluation this date. Prior to hospital admission, Alex Gomez was requiring assist for LB dressing and using stair lift for bed/bath upstairs. Alex Gomez lives with spouse in 2 level home. Spouse endorses falls hx. Alex Gomez currently requires MOD A dom underwear, assist for threading and balance in standing. MIN A + RW sit<>stand at bed, CGA + RW for ADL t/f ~30 ft, poor pleth noted with SpO2 87% on RA during mobility - endorses mild dsypnea. Alex Gomez would benefit from skilled OT to address noted impairments and functional limitations (see below for any additional details). Upon hospital discharge, recommend OT follow up <3 hours/day.    If plan is discharge home, recommend the following: A little help with walking and/or transfers;A little help with bathing/dressing/bathroom;Help with stairs or ramp for entrance    Functional Status Assessment  Patient has had a recent decline in their functional status and demonstrates the ability to make significant improvements in function in a reasonable and predictable amount of time.  Equipment Recommendations  BSC/3in1    Recommendations for Other Services       Precautions / Restrictions Precautions Precautions: Fall Restrictions Weight Bearing Restrictions: No      Mobility Bed Mobility Overal bed mobility: Needs Assistance Bed Mobility: Supine to Sit     Supine to sit: Min assist          Transfers Overall transfer level: Needs assistance Equipment used: Rolling walker (2 wheels) Transfers: Sit to/from Stand Sit to Stand: Min assist, +2  safety/equipment           General transfer comment: CGA + RW for ~30 ft functional mobility      Balance Overall balance assessment: Needs assistance Sitting-balance support: No upper extremity supported, Feet supported Sitting balance-Leahy Scale: Fair     Standing balance support: Bilateral upper extremity supported Standing balance-Leahy Scale: Fair                             ADL either performed or assessed with clinical judgement   ADL Overall ADL's : Needs assistance/impaired                                       General ADL Comments: MOD A dom underwear, assist for threading and balance in standing. MIN A + RW for toilet t/f.       Pertinent Vitals/Pain Pain Assessment Pain Assessment: No/denies pain     Extremity/Trunk Assessment Upper Extremity Assessment Upper Extremity Assessment: Overall WFL for tasks assessed   Lower Extremity Assessment Lower Extremity Assessment: Generalized weakness       Communication Communication Communication: Hearing impairment Cueing Techniques: Verbal cues   Cognition Arousal: Alert Behavior During Therapy: WFL for tasks assessed/performed Overall Cognitive Status: Within Functional Limits for tasks assessed                                       General Comments  poor pleth SpO2 87% on RA with mobility, Alex Gomez reports mild dsypnea            Home Living Family/patient expects to be discharged to:: Private residence Living Arrangements: Spouse/significant other Available Help at Discharge: Available PRN/intermittently;Family Type of Home: House Home Access: Stairs to enter [employer] of Steps: 3 Entrance Stairs-Rails: Can reach both Home Layout: Two level;Bed/bath upstairs Alternate Level Stairs-Number of Steps: stair lift             Home Equipment: Cane - single point;Shower seat;Standard Environmental consultant   Additional Comments: Patient has a stair lift at  home to reach the second floor that he does use. sleeps in relciner downstairs      Prior Functioning/Environment Prior Level of Function : Needs assist;History of Falls (last six months)             Mobility Comments: HHPT          OT Problem List: Decreased strength;Decreased range of motion;Decreased activity tolerance;Impaired balance (sitting and/or standing);Decreased safety awareness      OT Treatment/Interventions: Self-care/ADL training;Therapeutic exercise;Energy conservation;DME and/or AE instruction;Therapeutic activities;Balance training;Patient/family education    OT Goals(Current goals can be found in the care plan section) Acute Rehab OT Goals Patient Stated Goal: to go home OT Goal Formulation: With patient/family Time For Goal Achievement: 10/16/22 Potential to Achieve Goals: Good ADL Goals Alex Gomez Will Perform Grooming: with modified independence;standing Alex Gomez Will Perform Lower Body Dressing: sit to/from stand;with min assist;with caregiver independent in assisting Alex Gomez Will Transfer to Toilet: with modified independence;ambulating;regular height toilet  OT Frequency: Min 1X/week    Co-evaluation              AM-PAC OT "6 Clicks" Daily Activity     Outcome Measure Help from another person eating meals?: None Help from another person taking care of personal grooming?: A Little Help from another person toileting, which includes using toliet, bedpan, or urinal?: A Lot Help from another person bathing (including washing, rinsing, drying)?: A Lot Help from another person to put on and taking off regular upper body clothing?: A Little Help from another person to put on and taking off regular lower body clothing?: A Lot 6 Click Score: 16   End of Session Equipment Utilized During Treatment: Rolling walker (2 wheels)  Activity Tolerance: Patient tolerated treatment well Patient left: in bed;with call bell/phone within reach;with family/visitor present;with  nursing/sitter in room  OT Visit Diagnosis: Other abnormalities of gait and mobility (R26.89);Muscle weakness (generalized) (M62.81)                Time: 1610-9604 OT Time Calculation (min): 25 min Charges:  OT General Charges $OT Visit: 1 Visit OT Evaluation $OT Eval Moderate Complexity: 1 Mod  Kathie Dike, M.S. OTR/L  10/02/22, 2:18 PM  ascom [phone]

## 2022-10-02 NOTE — Significant Event (Addendum)
LKW is 10:45 PM per nurse and called to bedside for ? Speech abnormality.  Pt seen immediately and agree with assessment speech is slow and pt is perseverating.  Oriented . Nonfocal.    Physical Exam Constitutional:      Appearance: He is obese.  HENT:     Head: Normocephalic and atraumatic.  Eyes:     Extraocular Movements: Extraocular movements intact.  Neurological:     General: No focal deficit present.     Mental Status: He is alert and oriented to person, place, and time.     GCS: GCS eye subscore is 4. GCS verbal subscore is 5. GCS motor subscore is 6.     Cranial Nerves: No cranial nerve deficit, dysarthria or facial asymmetry.     Motor: No weakness, tremor, abnormal muscle tone or seizure activity.     Coordination: Coordination normal. Finger-Nose-Finger Test normal.    Stat head CT Noncontrast.  Called wife at 12:35 AM and informed her of same she is very appreciative that we are evaluating him thoroughly, inform ed her that we will call back in an hour or so as soon as we get imaging study back . Pt got melatonin tramadol Ambien and d/w neurology about asa and keppra. Head ct negative.

## 2022-10-02 NOTE — ED Notes (Signed)
Code stroke called to [employer] with Rep Jamie/

## 2022-10-02 NOTE — Progress Notes (Signed)
Code stroke timeline  LKW 2245, ROS 2350, EDP at bedside 0000 - Per primary RN MRS unknown  0007 Code stroke cart activation 0012 Cart battery died, TSRN called ED to plug in cart 0015 Back on cart, pt in CT 0028 Pt returned from CT 0030 RN reported NCCT negative for acute processes, TSMD paged 0033 TSMD ([place]) on camera, Neg NCCT result given during report. 0049 No TNK, no adv imaging at this time. TSRN and TSMD off camera. Marland Kitchen

## 2022-10-02 NOTE — Progress Notes (Signed)
Pt has arrived

## 2022-10-02 NOTE — Evaluation (Signed)
Physical Therapy Evaluation Patient Details Name: Alex Gomez MRN: [id number] DOB: 12/20/1945 Today's Date: 10/02/2022  History of Present Illness  Alex Gomez is a 77 y.o. male with medical history significant for COPD/CAD, diabetes, HTN/ AVR/ seizures coming for SOB/DOE and orthopnea.  Presentation concerning for delirium due to polypharmacy.  Clinical Impression   Pt is seen by OT and PT for co-evaluation to assess functional mobility. Pt is received in bed with spouse at bedside, he is agreeable to therapy session. At baseline, Pt reports amb with RW although experience recent falls, assistance from wife with ADLs/IADLs, and receiving HHPT with focus on balance. Pt performs bed mobility min A, transfers min A x2, and amb CGA. Pt requires min A x2 for STS to complete task due to BLE weakness. Pt able to amb approx 30 ft using RW CGA for safety with no reports of BLE pain. Vitals assessed throughout session and maintained WNL. Pt would benefit from skilled PT to address above deficits and promote optimal return to PLOF.      If plan is discharge home, recommend the following: A little help with walking and/or transfers;A lot of help with bathing/dressing/bathroom;Assist for transportation;Help with stairs or ramp for entrance   Can travel by private vehicle   Yes    Equipment Recommendations Other (comment) (TBD at next facility)  Recommendations for Other Services       Functional Status Assessment Patient has had a recent decline in their functional status and demonstrates the ability to make significant improvements in function in a reasonable and predictable amount of time.     Precautions / Restrictions Precautions Precautions: Fall Restrictions Weight Bearing Restrictions: No      Mobility  Bed Mobility Overal bed mobility: Needs Assistance Bed Mobility: Supine to Sit     Supine to sit: Min assist, HOB elevated, Used rails     General bed mobility comments:  assist for trunk control to sit EOB    Transfers Overall transfer level: Needs assistance Equipment used: Rolling walker (2 wheels) Transfers: Sit to/from Stand Sit to Stand: Min assist, +2 safety/equipment           General transfer comment: requires min A x2 for facilitation of STS    Ambulation/Gait Ambulation/Gait assistance: Contact guard assist Gait Distance (Feet): 30 Feet Assistive device: Rolling walker (2 wheels) Gait Pattern/deviations: Step-to pattern, Decreased step length - left, Decreased step length - right, Decreased stride length, Shuffle Gait velocity: decreased     General Gait Details: able to amb approx 30 ft using RW CGA for safety; slight shuffling noted  Stairs            Wheelchair Mobility     Tilt Bed    Modified Rankin (Stroke Patients Only)       Balance Overall balance assessment: Needs assistance Sitting-balance support: No upper extremity supported, Feet supported Sitting balance-Leahy Scale: Fair Sitting balance - Comments: able to maintain seated EOB balance during donning of underwear with CGA for safety   Standing balance support: Bilateral upper extremity supported, During functional activity Standing balance-Leahy Scale: Fair Standing balance comment: requires BUE support and assist with raising underwear up during static standing balance                             Pertinent Vitals/Pain Pain Assessment Pain Assessment: No/denies pain    Home Living Family/patient expects to be discharged to:: Private residence Living Arrangements: Spouse/significant  other Available Help at Discharge: Available PRN/intermittently;Family Type of Home: House Home Access: Stairs to enter Entrance Stairs-Rails: Can reach both Entrance Stairs-Number of Steps: 3 Alternate Level Stairs-Number of Steps: stair lift Home Layout: Two level;Bed/bath upstairs Home Equipment: Cane - single point;Shower seat;Standard  Environmental consultant Additional Comments: Patient has a stair lift at home to reach the second floor that he does use. sleeps in recliner downstairs    Prior Function Prior Level of Function : Needs assist;History of Falls (last six months)       Physical Assist : Mobility (physical) Mobility (physical): Transfers;Gait;Stairs   Mobility Comments: HHPT working on standing dynamic balance ADLs Comments: wife assist with ADLs/IADLs     Extremity/Trunk Assessment   Upper Extremity Assessment Upper Extremity Assessment: Overall WFL for tasks assessed;Defer to OT evaluation    Lower Extremity Assessment Lower Extremity Assessment: Generalized weakness       Communication   Communication Communication: Hearing impairment Cueing Techniques: Verbal cues  Cognition Arousal: Alert Behavior During Therapy: WFL for tasks assessed/performed Overall Cognitive Status: Within Functional Limits for tasks assessed                                 General Comments: AO x4; pleasant and cooperative during therapy        General Comments General comments (skin integrity, edema, etc.): poor pleth SpO2 87% on RA with mobility, pt reports mild dsypnea    Exercises     Assessment/Plan    PT Assessment Patient needs continued PT services  PT Problem List Decreased strength;Decreased activity tolerance;Decreased balance;Decreased coordination;Decreased mobility;Decreased cognition;Decreased safety awareness       PT Treatment Interventions DME instruction;Gait training;Stair training;Functional mobility training;Therapeutic exercise;Balance training;Neuromuscular re-education    PT Goals (Current goals can be found in the Care Plan section)  Acute Rehab PT Goals Patient Stated Goal: to go home PT Goal Formulation: With patient Time For Goal Achievement: 10/16/22 Potential to Achieve Goals: Good    Frequency Min 1X/week     Co-evaluation PT/OT/SLP Co-Evaluation/Treatment:  Yes Reason for Co-Treatment: Necessary to address cognition/behavior during functional activity;To address functional/ADL transfers PT goals addressed during session: Mobility/safety with mobility;Balance;Proper use of DME;Strengthening/ROM         AM-PAC PT "6 Clicks" Mobility  Outcome Measure Help needed turning from your back to your side while in a flat bed without using bedrails?: A Little Help needed moving from lying on your back to sitting on the side of a flat bed without using bedrails?: A Little Help needed moving to and from a bed to a chair (including a wheelchair)?: A Little Help needed standing up from a chair using your arms (e.g., wheelchair or bedside chair)?: A Little Help needed to walk in hospital room?: A Little Help needed climbing 3-5 steps with a railing? : A Lot 6 Click Score: 17    End of Session   Activity Tolerance: Patient tolerated treatment well Patient left: in bed;with call bell/phone within reach;with bed alarm set;with nursing/sitter in room;with family/visitor present Nurse Communication: Mobility status PT Visit Diagnosis: Unsteadiness on feet (R26.81);Repeated falls (R29.6);Muscle weakness (generalized) (M62.81);History of falling (Z91.81)    Time: 1610-9604 PT Time Calculation (min) (ACUTE ONLY): 25 min   Charges:                 Elmon Else, SPT   Aryanne Gilleland 10/02/2022, 3:42 PM

## 2022-10-02 NOTE — TOC Initial Note (Addendum)
Transition of Care [facility]) - Initial/Assessment Note    Patient Details  Name: Alex Gomez MRN: [id number] Date of Birth: 11/06/1945  Transition of Care [facility]) CM/SW Contact:    Alex Palms, LCSW Phone Number: 10/02/2022, 9:51 AM  Clinical Narrative:                  CSW met with patient bedside who reported that his uses [facility] and his wife will be transporting him. He reports that he not normally on O2 at home. He currently does not have HH. He reported that his wife Alex Gomez [phone] will be speaking further if he needs anything. No other needs at this time. Patient is currently active with [facility]. Alex Gomez reported he is "active with PT only."       Patient Goals and CMS Choice            Expected Discharge Plan and Services                                              Prior Living Arrangements/Services                       Activities of Daily Living      Permission Sought/Granted                  Emotional Assessment              Admission diagnosis:  Respiratory distress [R06.03] Patient Active Problem List   Diagnosis Date Noted   SOB (shortness of breath) 10/01/2022   Alcohol abuse 10/01/2022   Obstructive sleep apnea of adult 10/01/2022   Respiratory distress 10/01/2022   Iron deficiency anemia 06/03/2022   Rotaviral gastroenteritis 06/03/2022   Hyponatremia 06/02/2022   Hypokalemia 06/02/2022   B12 deficiency anemia 06/02/2022   Metabolic acidosis 06/02/2022   Gastroenteritis 06/02/2022   Obesity (BMI 30-39.9) 06/02/2022   Tremor 06/01/2022   Type 2 diabetes mellitus without complication (HCC) 06/01/2022   Atherosclerotic heart disease of native coronary artery without angina pectoris 06/01/2022   Benign prostatic hyperplasia without lower urinary tract symptoms 06/01/2022   Multiple myeloma (HCC) 06/01/2022   SIRS (systemic inflammatory response syndrome) (HCC) 06/01/2022   Acute  metabolic encephalopathy 06/01/2022   Thrombocytopenia (HCC) 05/31/2022   Dementia without behavioral disturbance (HCC) 05/31/2022   Frequent falls 05/31/2022   Hypoxia 05/31/2022   Seizure (HCC) 01/30/2020   Acute respiratory failure with hypoxia (HCC)    Depressive disorder 02/03/2018   Diverticulosis of colon 02/03/2018   Moderate aortic valve stenosis 04/04/2017   Benign essential hypertension 08/10/2014   Bilateral carotid artery stenosis 02/22/2014   Moderate mitral insufficiency 02/22/2014   CAD (coronary artery disease) 12/04/2013   Hyperlipidemia, mixed 12/04/2013   PCP:  Cl[facility], [facility] Pharm[facility]:   [facility] [place], [place] - [address] [place] [address] Phone: [phone] Fax: [phone]  [facility] - [place], [place] - [address] [place] [address] Phone: [phone] Fax: [phone]  [id number] - [place], [place] - [address] [place] [address] Phone: [phone] Fax: [phone]     Social Determinants of Health (SDOH) Social History: SDOH Screenings   Food Insecurity: No Food Insecurity (06/01/2022)  Housing: Low Risk  (  06/01/2022)  Transportation Needs: No Transportation Needs (06/01/2022)  Utilities: Not At Risk (06/01/2022)  Depression (PHQ2-9): Medium Risk (08/20/2018)  Tobacco Use: Medium Risk (10/01/2022)   SDOH Interventions:     Readmission Risk Interventions    10/02/2022    9:50 AM  Readmission Risk Prevention Plan  Transportation Screening Complete  PCP or Specialist Appt within 3-5 Days Complete  HRI or Home Care Consult Complete  Social Work Consult for Recovery Care Planning/Counseling Complete  Palliative Care Screening Not Applicable  Medication Review [employer]) Not Complete  Med Review Comments Patient will review medication upon discharge.

## 2022-10-03 ENCOUNTER — Inpatient Hospital Stay
Admit: 2022-10-03 | Discharge: 2022-10-03 | Disposition: A | Discharge status: Home / Self Care | Payer: No Typology Code available for payment source | Attending: Internal Medicine | Admitting: Internal Medicine

## 2022-10-03 DIAGNOSIS — F1010 Alcohol abuse, uncomplicated: Secondary | ICD-10-CM

## 2022-10-03 DIAGNOSIS — R0602 Shortness of breath: Secondary | ICD-10-CM | POA: Diagnosis not present

## 2022-10-03 DIAGNOSIS — D508 Other iron deficiency anemias: Secondary | ICD-10-CM

## 2022-10-03 DIAGNOSIS — I2510 Atherosclerotic heart disease of native coronary artery without angina pectoris: Secondary | ICD-10-CM

## 2022-10-03 DIAGNOSIS — R569 Unspecified convulsions: Secondary | ICD-10-CM

## 2022-10-03 DIAGNOSIS — I10 Essential (primary) hypertension: Secondary | ICD-10-CM | POA: Diagnosis not present

## 2022-10-03 DIAGNOSIS — I2583 Coronary atherosclerosis due to lipid rich plaque: Secondary | ICD-10-CM

## 2022-10-03 LAB — BASIC METABOLIC PANEL
Anion gap: 5 (ref 5–15)
BUN: 21 mg/dL (ref 8–23)
CO2: 29 mmol/L (ref 22–32)
Calcium: 7.9 mg/dL — ABNORMAL LOW (ref 8.9–10.3)
Chloride: 95 mmol/L — ABNORMAL LOW (ref 98–111)
Creatinine, Ser: 1.03 mg/dL (ref 0.61–1.24)
GFR, Estimated: >60 mL/min (ref >60)
Glucose, Bld: 151 mg/dL — ABNORMAL HIGH (ref 70–99)
Potassium: 3.7 mmol/L (ref 3.5–5.1)
Sodium: 129 mmol/L — ABNORMAL LOW (ref 135–145)

## 2022-10-03 LAB — CBC
HCT: 33.9 % — ABNORMAL LOW (ref 39.0–52.0)
Hemoglobin: 10.8 g/dL — ABNORMAL LOW (ref 13.0–17.0)
MCH: 25.7 pg — ABNORMAL LOW (ref 26.0–34.0)
MCHC: 31.9 g/dL (ref 30.0–36.0)
MCV: 80.5 fL (ref 80.0–100.0)
Platelets: 63 10*3/uL — ABNORMAL LOW (ref 150–400)
RBC: 4.21 MIL/uL — ABNORMAL LOW (ref 4.22–5.81)
RDW: 19.8 % — ABNORMAL HIGH (ref 11.5–15.5)
WBC: 8 10*3/uL (ref 4.0–10.5)
nRBC: 0 % (ref 0.0–0.2)

## 2022-10-03 LAB — GLUCOSE, CAPILLARY
Glucose-Capillary: 131 mg/dL — ABNORMAL HIGH (ref 70–99)
Glucose-Capillary: 140 mg/dL — ABNORMAL HIGH (ref 70–99)
Glucose-Capillary: 143 mg/dL — ABNORMAL HIGH (ref 70–99)
Glucose-Capillary: 117 mg/dL — ABNORMAL HIGH (ref 70–99)

## 2022-10-03 LAB — OSMOLALITY: Osmolality: 284 mosm/kg (ref 275–295)

## 2022-10-03 LAB — OSMOLALITY, URINE: Osmolality, Ur: 291 mosm/kg — ABNORMAL LOW (ref 300–900)

## 2022-10-03 LAB — SODIUM, URINE, RANDOM: Sodium, Ur: 34 mmol/L

## 2022-10-03 MED ORDER — BISACODYL 10 MG RE SUPP
10.0000 mg | Freq: Every day | RECTAL | Status: DC | PRN
  Administered 2022-10-05: 10 mg via RECTAL
  Filled 2022-10-03: qty 1

## 2022-10-03 MED ORDER — POLYETHYLENE GLYCOL 3350 17 G PO PACK
17.0000 g | PACK | Freq: Two times a day (BID) | ORAL | Status: DC
  Administered 2022-10-03 – 2022-10-09 (×13): 17 g via ORAL
  Filled 2022-10-03 (×14): qty 1

## 2022-10-03 MED ORDER — SODIUM CHLORIDE 0.9 % IV SOLN: INTRAVENOUS | Status: AC

## 2022-10-03 MED ORDER — ADULT MULTIVITAMIN W/MINERALS CH
1.0000 | ORAL_TABLET | Freq: Every day | ORAL | Status: DC
  Administered 2022-10-03 – 2022-10-10 (×8): 1 via ORAL
  Filled 2022-10-03 (×8): qty 1

## 2022-10-03 MED ORDER — ENSURE MAX PROTEIN PO LIQD
11.0000 [oz_av] | Freq: Every day | ORAL | Status: DC
  Administered 2022-10-03 – 2022-10-10 (×8): 11 [oz_av] via ORAL
  Filled 2022-10-03: qty 330

## 2022-10-03 NOTE — Progress Notes (Signed)
[facility] CLINIC CARDIOLOGY CONSULT NOTE       Patient ID: Alex Gomez MRN: [id number] DOB/AGE: 75-Aug-1947 74 y.o.  Admit date: 10/01/2022 Referring Physician Dr. Joylene Igo Primary Physician [facility] Primary Cardiologist Dr. Gwen Pounds (last seen 2019, lost for follow up) Reason for Consultation ?heart failure  HPI: Alex Gomez is a 77 y.o. male  with a past medical history of aortic stenosis s/p AVR at [facility] 2019, coronary artery disease by CT scan, hypertension, hyperlipidemia, diabetes, seizures, depression who presented to the ED on 10/01/2022 for cough, shortness of breath. Cardiology was consulted for further evaluation.   Interval history: -Patient feeling well overall this AM, states slightly better than yesterday.  -He worked with OT this AM and got OOB which he tolerated well.  -Patient diuresed well with IV lasix yesterday, started on IVF this AM for hyponatremia.   Review of systems complete and found to be negative unless listed above    Past Medical History:  Diagnosis Date   COPD (chronic obstructive pulmonary disease) (HCC)    Hypercholesterolemia    Seizures (HCC)     History reviewed. No pertinent surgical history.  Medications Prior to Admission  Medication Sig Dispense Refill Last Dose   albuterol (VENTOLIN HFA) 108 (90 Base) MCG/ACT inhaler Inhale 2 puffs into the lungs every 6 (six) hours as needed for wheezing or shortness of breath. 8 g 2 prn at unk   allopurinol (ZYLOPRIM) 300 MG tablet Take 300 mg by mouth daily.   09/30/2022   aspirin EC 81 MG tablet Take 81 mg by mouth daily.   Past Week   atorvastatin (LIPITOR) 40 MG tablet Take 40 mg by mouth daily.   09/30/2022   carboxymethylcellulose (REFRESH PLUS) 0.5 % SOLN 1 drop 3 (three) times daily as needed.   09/30/2022   Cholecalciferol 25 MCG (1000 UT) tablet Take 1,000 Units by mouth daily.   09/30/2022   clobetasol cream (TEMOVATE) 0.05 % Apply 1 Application topically 2 (two) times daily.   Past  Week   coal tar (NEUTROGENA T-GEL) 0.5 % shampoo Apply 1 Application topically at bedtime as needed.   Past Week   cyanocobalamin (VITAMIN B12) 1000 MCG tablet Take 1 tablet (1,000 mcg total) by mouth daily. 30 tablet 0 Past Week   desonide (DESOWEN) 0.05 % cream Apply 1 Application topically 2 (two) times daily.   Past Week   ferrous sulfate 325 (65 FE) MG EC tablet Take 1 tablet (325 mg total) by mouth daily with breakfast. 30 tablet 0 09/30/2022   finasteride (PROSCAR) 5 MG tablet Take 5 mg by mouth at bedtime.   09/30/2022   fluconazole (DIFLUCAN) 200 MG tablet Take 200 mg by mouth once a week.   Past Week   HYDROcodone-acetaminophen (NORCO/VICODIN) 5-325 MG tablet Take 1 tablet by mouth every 4 (four) hours as needed for moderate pain. 10 tablet 0 Past Month   hydrOXYzine (ATARAX) 10 MG tablet Take 10 mg by mouth daily as needed.   Past Week   ipratropium (ATROVENT) 0.06 % nasal spray Place 2 sprays into both nostrils daily as needed for rhinitis.   Past Week   Ipratropium-Albuterol (COMBIVENT RESPIMAT) 20-100 MCG/ACT AERS respimat Inhale 1 puff into the lungs every 6 (six) hours as needed for wheezing or shortness of breath.   Past Week   ketoconazole (NIZORAL) 2 % cream Apply 1 application topically daily. Apply 1 application to scales on sides of nose   Past Week   levETIRAcetam (KEPPRA)  500 MG tablet Take 500 mg by mouth every morning. 1 tablet (500mg ) qam and 1/2 tablet (250mg ) in PM. Verified with pt's wife and using pill container on 06/01/22   09/30/2022   lidocaine (LIDODERM) 5 % Place 1 patch onto the skin every 12 (twelve) hours. Remove & Discard patch within 12 hours or as directed by MD 10 patch 0 Past Week   melatonin 5 MG TABS Take 5 mg by mouth at bedtime as needed.   09/30/2022   metoprolol tartrate (LOPRESSOR) 25 MG tablet Take 12.5 mg by mouth 2 (two) times daily.   09/30/2022   Multiple Vitamins-Minerals (MULTIVITAMIN WITH MINERALS) tablet Take 1 tablet by mouth daily.   09/30/2022    oxyCODONE-acetaminophen (PERCOCET) 5-325 MG tablet Take 1 tablet by mouth every 4 (four) hours as needed for severe pain. 8 tablet 0 Past Week   prazosin (MINIPRESS) 5 MG capsule Take 10 mg by mouth at bedtime.   09/30/2022   primidone (MYSOLINE) 50 MG tablet Take 50 mg by mouth 2 (two) times daily.   09/30/2022   tamsulosin (FLOMAX) 0.4 MG CAPS capsule Take 0.4 mg by mouth daily. bedtime   09/30/2022   traMADol (ULTRAM) 50 MG tablet Take 50 mg by mouth every 6 (six) hours as needed.   Past Week   triamcinolone ointment (KENALOG) 0.1 % Apply 1 Application topically 2 (two) times daily.   Past Week   venlafaxine XR (EFFEXOR-XR) 150 MG 24 hr capsule Take 450 mg by mouth daily.   09/30/2022   zolpidem (AMBIEN) 5 MG tablet Take 2.5 mg by mouth at bedtime as needed.   09/30/2022   colchicine 0.6 MG tablet Take 1 tablet (0.6 mg total) by mouth 2 (two) times daily for 10 days. 20 tablet 0    levETIRAcetam (KEPPRA) 500 MG tablet Take 250 mg by mouth every evening. 1 tablet (500mg ) qam and 1/2 tablet (250mg ) in PM. Verified with pt's wife and using pill container on 06/01/22 (Patient not taking: Reported on 10/01/2022)   Not Taking   Spacer/Aero-Holding Chambers (AEROCHAMBER MV) inhaler Use as instructed 1 each 0    Social History   Socioeconomic History   Marital status: Married    Spouse name: Not on file   Number of children: Not on file   Years of education: Not on file   Highest education level: Not on file  Occupational History   Not on file  Tobacco Use   Smoking status: Former    Current packs/day: 0.00    Average packs/day: 1 pack/day for 30.0 years (30.0 ttl pk-yrs)    Types: Cigarettes    Start date: 27    Quit date: 1999    Years since quitting: 25.7   Smokeless tobacco: Never  Substance and Sexual Activity   Alcohol use: Not Currently   Drug use: Never   Sexual activity: Not Currently  Other Topics Concern   Not on file  Social History Narrative   Not on file   Social  Determinants of Health   Financial Resource Strain: Not on file  Food Insecurity: No Food Insecurity (10/03/2022)   Hunger Vital Sign    Worried About Running Out of Food in the Last Year: Never true    Ran Out of Food in the Last Year: Never true  Transportation Needs: No Transportation Needs (10/03/2022)   PRAPARE - Administrator, Civil Service (Medical): No    Lack of Transportation (Non-Medical): No  Physical Activity: Not  on file  Stress: Not on file  Social Connections: Not on file  Intimate Partner Violence: Not At Risk (10/03/2022)   Humiliation, Afraid, Rape, and Kick questionnaire    Fear of Current or Ex-Partner: No    Emotionally Abused: No    Physically Abused: No    Sexually Abused: No    History reviewed. No pertinent family history.   Vitals:   10/03/22 0204 10/03/22 0321 10/03/22 0746 10/03/22 1140  BP:  129/67 121/67 126/75  Pulse:   79 81  Resp:  20 18 16   Temp:  99.1 F (37.3 C) 98.3 F (36.8 C) 98.1 F (36.7 C)  TempSrc:  Oral Oral Oral  SpO2: 92% 92% 93% 97%  Weight:      Height:        PHYSICAL EXAM General: Chronically ill-appearing, well nourished, in no acute distress laying flat in hospital bed with wife present at bedside. HEENT: Normocephalic and atraumatic. Neck: No JVD.  Lungs: Normal respiratory effort on room air. Clear bilaterally to auscultation. No wheezes, crackles, rhonchi.  Heart: HRRR. Normal S1 and S2 without gallops.  3/6 systolic ejection murmur. Abdomen: Non-distended appearing.  Msk: Normal strength and tone for age. Extremities: Warm and well perfused. No clubbing, cyanosis. Trace edema.  Neuro: Alert and oriented X 3. Psych: Answers questions appropriately.   Labs: Basic Metabolic Panel: Recent Labs    10/02/22 1020 10/03/22 0432  NA 134* 129*  K 3.9 3.7  CL 100 95*  CO2 28 29  GLUCOSE 153* 151*  BUN 17 21  CREATININE 0.82 1.03  CALCIUM 7.8* 7.9*   Liver Function Tests: Recent Labs     10/01/22 1744 10/02/22 1020  AST 29 33  ALT 57* 66*  ALKPHOS 55 56  BILITOT 0.1* 0.3  PROT 7.9 7.7  ALBUMIN 2.3* 2.1*   No results for input(s): "LIPASE", "AMYLASE" in the last 72 hours. CBC: Recent Labs    10/02/22 1020 10/03/22 0432  WBC 7.9 8.0  NEUTROABS 6.0  --   HGB 10.9* 10.8*  HCT 33.8* 33.9*  MCV 78.8* 80.5  PLT 66* 63*   Cardiac Enzymes: Recent Labs    10/01/22 1537 10/01/22 1722  TROPONINIHS 4 6   BNP: Recent Labs    10/01/22 1537  BNP 104.0*   D-Dimer: No results for input(s): "DDIMER" in the last 72 hours. Hemoglobin A1C: Recent Labs    10/02/22 1020  HGBA1C 7.5*   Fasting Lipid Panel: No results for input(s): "CHOL", "HDL", "LDLCALC", "TRIG", "CHOLHDL", "LDLDIRECT" in the last 72 hours. Thyroid Function Tests: No results for input(s): "TSH", "T4TOTAL", "T3FREE", "THYROIDAB" in the last 72 hours.  Invalid input(s): "FREET3" Anemia Panel: No results for input(s): "VITAMINB12", "FOLATE", "FERRITIN", "TIBC", "IRON", "RETICCTPCT" in the last 72 hours.   Radiology: MR BRAIN WO CONTRAST  Result Date: 10/02/2022 CLINICAL DATA:  Neuro deficit, acute, stroke suspected.  Aphasia. EXAM: MRI HEAD WITHOUT CONTRAST TECHNIQUE: Multiplanar, multiecho pulse sequences of the brain and surrounding structures were obtained without intravenous contrast. COMPARISON:  Head CT 10/02/2022 and MRI 01/29/2020 FINDINGS: Brain: There is no evidence of an acute infarct, mass, midline shift, or extra-axial fluid collection. Several chronic cerebral microhemorrhages are again seen. Small T2 hyperintensities in the cerebral white matter have not significantly progressed from the prior MRI and are nonspecific but compatible with mild chronic small vessel ischemic disease. Small chronic cortical infarcts small chronic cortical infarcts in the left frontal and left parietal lobes are unchanged from the prior MRI. Small  chronic bilateral cerebellar infarcts are new from the prior MRI.  There is moderate cerebral atrophy. Vascular: Major intracranial vascular flow voids are preserved. Skull and upper cervical spine: Unremarkable bone marrow signal. Sinuses/Orbits: Left cataract extraction. Small mucous retention cyst in the right maxillary sinus. Right mastoid effusion. Other: None. IMPRESSION: 1. No acute intracranial abnormality. 2. Mild chronic small vessel ischemic disease with small chronic left frontal, left parietal, and bilateral cerebellar infarcts. Electronically Signed   By: Sebastian Ache M.D.   On: 10/02/2022 14:03   CT HEAD CODE STROKE WO CONTRAST`  Result Date: 10/02/2022 CLINICAL DATA:  Code stroke.  Aphasia EXAM: CT HEAD WITHOUT CONTRAST TECHNIQUE: Contiguous axial images were obtained from the base of the skull through the vertex without intravenous contrast. RADIATION DOSE REDUCTION: This exam was performed according to the departmental dose-optimization program which includes automated exposure control, adjustment of the mA and/or kV according to patient size and/or use of iterative reconstruction technique. COMPARISON:  06/01/2022 FINDINGS: Brain: There is no mass, hemorrhage or extra-axial collection. The size and configuration of the ventricles and extra-axial CSF spaces are normal. The brain parenchyma is normal, without evidence of acute or chronic infarction. Vascular: No abnormal hyperdensity of the major intracranial arteries or dural venous sinuses. No intracranial atherosclerosis. Skull: The visualized skull base, calvarium and extracranial soft tissues are normal. Sinuses/Orbits: No fluid levels or advanced mucosal thickening of the visualized paranasal sinuses. No mastoid or middle ear effusion. The orbits are normal. ASPECTS [facility] Stroke Program Early CT Score) - Ganglionic level infarction (caudate, lentiform nuclei, internal capsule, insula, M1-M3 cortex): 7 - Supraganglionic infarction (M4-M6 cortex): 3 Total score (0-10 with 10 being normal): 10 IMPRESSION:  1. No acute intracranial abnormality. 2. ASPECTS is 10. Results were called by telephone at the time of interpretation on 10/02/2022 at 12:30 am to provider EKTA PATEL , who verbally acknowledged these results. Electronically Signed   By: Deatra Robinson M.D.   On: 10/02/2022 00:30   CT Angio Chest PE W and/or Wo Contrast  Result Date: 10/01/2022 CLINICAL DATA:  New onset hypoxia with cough, initial encounter EXAM: CT ANGIOGRAPHY CHEST WITH CONTRAST TECHNIQUE: Multidetector CT imaging of the chest was performed using the standard protocol during bolus administration of intravenous contrast. Multiplanar CT image reconstructions and MIPs were obtained to evaluate the vascular anatomy. RADIATION DOSE REDUCTION: This exam was performed according to the departmental dose-optimization program which includes automated exposure control, adjustment of the mA and/or kV according to patient size and/or use of iterative reconstruction technique. CONTRAST:  75mL OMNIPAQUE IOHEXOL 350 MG/ML SOLN COMPARISON:  Chest x-ray from earlier in the same day. FINDINGS: Cardiovascular: Atherosclerotic calcifications of the thoracic aorta are noted. No aneurysmal dilatation is seen. Heavy coronary calcifications are noted. The pulmonary artery shows a normal branching pattern bilaterally. No filling defect to suggest pulmonary embolism is seen. Mediastinum/Nodes: Thoracic inlet is within normal limits. No sizable hilar or mediastinal adenopathy is noted. The esophagus as visualized is within normal limits. Lungs/Pleura: Bibasilar atelectatic changes are noted. No sizable effusion is seen. No parenchymal nodule is noted. Upper Abdomen: Visualized upper abdomen is within normal limits. Musculoskeletal: Degenerative changes of the thoracic spine are noted. No acute rib abnormality is noted. Review of the MIP images confirms the above findings. IMPRESSION: No evidence of pulmonary emboli. Bibasilar atelectasis without effusion. No other focal  abnormality is noted. Aortic Atherosclerosis (ICD10-I70.0). Electronically Signed   By: Alcide Clever M.D.   On: 10/01/2022 20:50   DG Chest  2 View  Result Date: 10/01/2022 CLINICAL DATA:  Cough for 1 week. EXAM: CHEST - 2 VIEW COMPARISON:  May 31, 2022. FINDINGS: Stable cardiomediastinal silhouette. Status post coronary bypass graft. Mild bilateral midlung subsegmental atelectasis or scarring is noted. Minimal right pleural effusion is noted. Bony thorax is unremarkable. IMPRESSION: Mild bilateral midlung subsegmental atelectasis or scarring is noted which is slightly more prominent compared to prior exam. Minimal right pleural effusion. Electronically Signed   By: Lupita Raider M.D.   On: 10/01/2022 16:28   DG Abd Portable 1 View  Result Date: 09/27/2022 CLINICAL DATA:  Constipation left flank pain EXAM: PORTABLE ABDOMEN - 1 VIEW COMPARISON:  CT abdomen 09/26/2022 FINDINGS: Prominent stool throughout the colon favors mild constipation. Residual contrast medium in the urinary bladder. Speckled calcifications over the kidneys, more visible on the right than the left, compatible with nonobstructive renal calculi as shown on recent CT. Indistinct bandlike airspace opacity at the lung bases compatible with atelectasis and potentially small pleural effusions. Thoracolumbar spondylosis.  Mild degenerative hip arthropathy. IMPRESSION: 1. Prominent stool throughout the colon favors mild constipation. 2. Bilateral nonobstructive nephrolithiasis. 3. Indistinct bandlike airspace opacity at the lung bases compatible with atelectasis and potentially small pleural effusions. Electronically Signed   By: Gaylyn Rong M.D.   On: 09/27/2022 14:39   CT ABDOMEN PELVIS W CONTRAST  Result Date: 09/26/2022 CLINICAL DATA:  Abdominal/flank pain, stone suspected. Left-sided flank pain. EXAM: CT ABDOMEN AND PELVIS WITH CONTRAST TECHNIQUE: Multidetector CT imaging of the abdomen and pelvis was performed using the standard  protocol following bolus administration of intravenous contrast. RADIATION DOSE REDUCTION: This exam was performed according to the departmental dose-optimization program which includes automated exposure control, adjustment of the mA and/or kV according to patient size and/or use of iterative reconstruction technique. CONTRAST:  OMNIPAQUE IOHEXOL 300 MG/ML  SOLN COMPARISON:  None Available. FINDINGS: Lower chest: Small bilateral pleural effusions with adjacent atelectasis in the lung bases. TAVR in place. Coronary artery calcifications. Hepatobiliary: No focal liver abnormality is seen. No gallstones, gallbladder wall thickening, or biliary dilatation. Pancreas: Unremarkable. No pancreatic ductal dilatation or surrounding inflammatory changes. Spleen: Normal in size without focal abnormality. Adrenals/Urinary Tract: Adrenal glands are unremarkable. Bilateral calyceal stones measuring up to 7 mm on the right and 4 mm on the left. No hydronephrosis. Bladder is unremarkable. Stomach/Bowel: Normal stomach and duodenum. No dilated loops of small bowel. Appendix is not visualized. Colon is unremarkable. No bowel wall thickening or surrounding inflammation. Vascular/Lymphatic: Aortic atherosclerosis. No enlarged abdominal or pelvic lymph nodes. Reproductive: Prostate is unremarkable. Other: No abdominal wall hernia or abnormality. No abdominopelvic ascites. Musculoskeletal: No acute or significant osseous findings. IMPRESSION: 1. No acute abnormality in the abdomen or pelvis. 2. Bilateral calyceal stones measuring up to 7 mm on the right and 4 mm on the left. No hydronephrosis. 3. Small bilateral pleural effusions with adjacent atelectasis in the lung bases. Aortic Atherosclerosis (ICD10-I70.0). Electronically Signed   By: Orvan Falconer M.D.   On: 09/26/2022 16:24    ECHO pending  TELEMETRY reviewed by me 10/03/2022: sinus rhythm PACs rate 70s, paroxysms of sinus tach with PACs overnight in the 120s,  nonsustaining  EKG reviewed by me: NSR rate 69 bpm  Data reviewed by me 10/03/2022: last 24h vitals tele labs imaging I/O ED provider note, admission H&P  Principal Problem:   SOB (shortness of breath) Active Problems:   Benign essential hypertension   CAD (coronary artery disease)   Seizure (HCC)  Acute respiratory failure with hypoxia (HCC)   Type 2 diabetes mellitus without complication (HCC)   Iron deficiency anemia   Alcohol abuse   Obstructive sleep apnea of adult   Respiratory distress    ASSESSMENT AND PLAN:  DANTAVIOUS GOBLIRSCH is a 77 y.o. male  with a past medical history of aortic stenosis, coronary artery disease by CT scan, hypertension, hyperlipidemia, diabetes, seizures, depression who presented to the ED on 10/01/2022 for cough, shortness of breath. Cardiology was consulted for further evaluation.   # ?Heart failure # Weakness, deconditioning # Aortic stenosis s/p AVR 2019 Patient presenting with reported shortness of breath and SpO2 88% requiring supplemental oxygen.  Has history of aortic stenosis and underwent valve replacement with bioprosthetic valve at the [facility] in 2019 per his wife.  Description of symptoms sound more consistent with generalized deconditioning than acute heart failure. Appears euvolemic on exam -Echo pending -S/p IV lasix 20 mg x1 yesterday. Net negative 1L.  -Defer additional diuresis at this time. Consider po lasix 20 mg daily on discharge.  -PT/OT evaluation  # Acute respiratory failure # COPD exacerbation Reported productive cough for 1 week. -Management per primary  # Coronary artery disease s/p DES 2019 Unable to determine location of stents from [place] records.  Patient is without chest pain. -Continue aspirin 81 mg daily and Lipitor 20 mg daily -Continue metoprolol tartrate 12.5 mg twice daily.  Cardiology will sign off. Please haiku with questions or re-engage if needed.    This patient's plan of care was discussed and created  with Dr. Corky Sing and he is in agreement.  Signed: Gale Journey, PA-C  10/03/2022, 1:27 PM [facility] Cardiology

## 2022-10-03 NOTE — [place] FL2 (Signed)
[place] MEDICAID FL2 LEVEL OF CARE FORM     IDENTIFICATION  Patient Name: Alex Gomez Birthdate: 06-05-1945 Sex: male Admission Date (Current Location): 10/01/2022  [place] Number:  Chiropodist and Address:  [facility], [address], [place], [place] [address]      Provider Number: [id number]  Attending Physician Name and Address:  Arnetha Courser, MD  Relative Name and Phone Number:  [name] (spouse) [phone]    Current Level of Care: Hospital Recommended Level of Care: Skilled Nursing Facility Prior Approval Number:    Date Approved/Denied:   PASRR Number: [id number]  Discharge Plan: SNF    Current Diagnoses: Patient Active Problem List   Diagnosis Date Noted   SOB (shortness of breath) 10/01/2022   Alcohol abuse 10/01/2022   Obstructive sleep apnea of adult 10/01/2022   Respiratory distress 10/01/2022   Iron deficiency anemia 06/03/2022   Rotaviral gastroenteritis 06/03/2022   Hyponatremia 06/02/2022   Hypokalemia 06/02/2022   B12 deficiency anemia 06/02/2022   Metabolic acidosis 06/02/2022   Gastroenteritis 06/02/2022   Obesity (BMI 30-39.9) 06/02/2022   Tremor 06/01/2022   Type 2 diabetes mellitus without complication (HCC) 06/01/2022   Atherosclerotic heart disease of native coronary artery without angina pectoris 06/01/2022   Benign prostatic hyperplasia without lower urinary tract symptoms 06/01/2022   Multiple myeloma (HCC) 06/01/2022   SIRS (systemic inflammatory response syndrome) (HCC) 06/01/2022   Acute metabolic encephalopathy 06/01/2022   Thrombocytopenia (HCC) 05/31/2022   Dementia without behavioral disturbance (HCC) 05/31/2022   Frequent falls 05/31/2022   Hypoxia 05/31/2022   Seizure (HCC) 01/30/2020   Acute respiratory failure with hypoxia (HCC)    Depressive disorder 02/03/2018   Diverticulosis of colon 02/03/2018   Moderate aortic valve stenosis 04/04/2017   Benign essential  hypertension 08/10/2014   Bilateral carotid artery stenosis 02/22/2014   Moderate mitral insufficiency 02/22/2014   CAD (coronary artery disease) 12/04/2013   Hyperlipidemia, mixed 12/04/2013    Orientation RESPIRATION BLADDER Height & Weight     Self, Time, Situation, Place  O2 (2L nasal cannula) Incontinent, External catheter Weight: 229 lb 4.5 oz (104 kg) Height:  6\' 1"  (185.4 cm)  BEHAVIORAL SYMPTOMS/MOOD NEUROLOGICAL BOWEL NUTRITION STATUS      Continent Diet (see discharge summary)  AMBULATORY STATUS COMMUNICATION OF NEEDS Skin   Limited Assist Verbally Normal                       Personal Care Assistance Level of Assistance  Bathing, Dressing, Total care, Feeding Bathing Assistance: Limited assistance Feeding assistance: Independent Dressing Assistance: Limited assistance Total Care Assistance: Limited assistance   Functional Limitations Info  Sight, Speech, Hearing Sight Info: Adequate Hearing Info: Adequate Speech Info: Adequate    SPECIAL CARE FACTORS FREQUENCY  PT (By licensed PT), OT (By licensed OT)     PT Frequency: min 4x weekly OT Frequency: min 4x weekly            Contractures Contractures Info: Not present    Additional Factors Info  Code Status, Allergies Code Status Info: full Allergies Info: shellfish allergy           Current Medications (10/03/2022):  This is the current hospital active medication list Current Facility-Administered Medications  Medication Dose Route Frequency Provider Last Rate Last Admin   0.9 %  sodium chloride infusion   Intravenous Continuous Arnetha Courser, MD 100 mL/hr at 10/03/22 1016 New Bag at 10/03/22 1016   acetaminophen (TYLENOL)  tablet 650 mg  650 mg Oral Q6H PRN Gertha Calkin, MD       Or   acetaminophen (TYLENOL) suppository 650 mg  650 mg Rectal Q6H PRN Gertha Calkin, MD       albuterol (PROVENTIL) (2.5 MG/3ML) 0.083% nebulizer solution 2.5 mg  2.5 mg Nebulization Q6H PRN Otelia Sergeant, RPH        aspirin EC tablet 81 mg  81 mg Oral QHS Gertha Calkin, MD   81 mg at 10/02/22 2120   atorvastatin (LIPITOR) tablet 20 mg  20 mg Oral Daily Agbata, Tochukwu, MD   20 mg at 10/03/22 0914   bisacodyl (DULCOLAX) suppository 10 mg  10 mg Rectal Daily PRN Arnetha Courser, MD       finasteride (PROSCAR) tablet 5 mg  5 mg Oral QHS Irena Cords V, MD   5 mg at 10/02/22 2122   hydrALAZINE (APRESOLINE) injection 5 mg  5 mg Intravenous Q4H PRN Gertha Calkin, MD       insulin aspart (novoLOG) injection 0-5 Units  0-5 Units Subcutaneous QHS Irena Cords V, MD       insulin aspart (novoLOG) injection 0-9 Units  0-9 Units Subcutaneous TID WC Gertha Calkin, MD   1 Units at 10/03/22 0915   levETIRAcetam (KEPPRA) tablet 500 mg  500 mg Oral Daily Irena Cords V, MD   500 mg at 10/03/22 0915   And   levETIRAcetam (KEPPRA) tablet 250 mg  250 mg Oral QHS Irena Cords V, MD   250 mg at 10/02/22 2344   melatonin tablet 5 mg  5 mg Oral QHS PRN Gertha Calkin, MD   5 mg at 10/02/22 2350   metoprolol tartrate (LOPRESSOR) tablet 12.5 mg  12.5 mg Oral BID Irena Cords V, MD   12.5 mg at 10/03/22 0914   mometasone-formoterol (DULERA) 200-5 MCG/ACT inhaler 2 puff  2 puff Inhalation BID Agbata, Tochukwu, MD   2 puff at 10/03/22 0917   morphine (PF) 2 MG/ML injection 2 mg  2 mg Intravenous Q2H PRN Gertha Calkin, MD       ondansetron (ZOFRAN) tablet 4 mg  4 mg Oral Q6H PRN Gertha Calkin, MD       Or   ondansetron (ZOFRAN) injection 4 mg  4 mg Intravenous Q6H PRN Gertha Calkin, MD       oxyCODONE (Oxy IR/ROXICODONE) immediate release tablet 5 mg  5 mg Oral Q4H PRN Irena Cords V, MD   5 mg at 10/02/22 1427   polyethylene glycol (MIRALAX / GLYCOLAX) packet 17 g  17 g Oral BID Arnetha Courser, MD   17 g at 10/03/22 1033   prazosin (MINIPRESS) capsule 10 mg  10 mg Oral QHS Irena Cords V, MD   10 mg at 10/02/22 2120   primidone (MYSOLINE) tablet 50 mg  50 mg Oral BID Gertha Calkin, MD   50 mg at 10/03/22 0914   sodium chloride flush (NS) 0.9  % injection 3 mL  3 mL Intravenous Q12H Irena Cords V, MD   3 mL at 10/03/22 0917   tamsulosin (FLOMAX) capsule 0.4 mg  0.4 mg Oral Daily Irena Cords V, MD   0.4 mg at 10/03/22 0914   thiamine (VITAMIN B1) injection 100 mg  100 mg Intravenous Q24H Irena Cords V, MD   100 mg at 10/02/22 2123   venlafaxine XR (EFFEXOR-XR) 24 hr capsule 150 mg  150 mg Oral Q breakfast Allena Katz,  Eliezer Mccoy, MD   150 mg at 10/03/22 1016   zolpidem (AMBIEN) tablet 2.5 mg  2.5 mg Oral QHS PRN Gertha Calkin, MD   2.5 mg at 10/02/22 2122     Discharge Medications: Please see discharge summary for a list of discharge medications.  Relevant Imaging Results:  Relevant Lab Results:   Additional Information SSN: [id number]  Darolyn Rua, LCSW

## 2022-10-03 NOTE — Progress Notes (Signed)
Physical Therapy Treatment Patient Details Name: Alex Gomez MRN: [id number] DOB: 08/05/1945 Today's Date: 10/03/2022   History of Present Illness Alex Gomez is a 77 y.o. male with medical history significant for COPD/CAD, diabetes, HTN/ AVR/ seizures coming for SOB/DOE and orthopnea.  Presentation concerning for delirium due to polypharmacy.    PT Comments  Pt is received in bed, he is agreeable to PT session. Pt performs bed mobility min A, transfers and amb CGA for safety. SpO2 98% on RA throughout session with no reports of SOB or WOB. Pt able to amb approx 95 ft using RW with cuing for AD management. Pt reports being easily startled with loud noises following extensive time serving in army that he states "sometimes affects me". Overall, Pt demonstrates steady progression towards PT goals as seen by increase amb distance. Pt would benefit from cont skilled PT to address above deficits and promote optimal return to PLOF.   If plan is discharge home, recommend the following: A little help with walking and/or transfers;A lot of help with bathing/dressing/bathroom;Assist for transportation;Help with stairs or ramp for entrance   Can travel by private vehicle     Yes  Equipment Recommendations  Other (comment) (TBD at next facility)    Recommendations for Other Services       Precautions / Restrictions Restrictions Weight Bearing Restrictions: No     Mobility  Bed Mobility Overal bed mobility: Needs Assistance Bed Mobility: Supine to Sit, Sit to Supine     Supine to sit: Min assist, HOB elevated, Used rails Sit to supine: Min assist, HOB elevated, Used rails   General bed mobility comments: assist for trunk control; required extended time to perform bed mobility    Transfers Overall transfer level: Needs assistance Equipment used: Rolling walker (2 wheels) Transfers: Sit to/from Stand Sit to Stand: Contact guard assist           General transfer comment: Pt able to  perform STS CGA with cuing for hand placement    Ambulation/Gait Ambulation/Gait assistance: Contact guard assist Gait Distance (Feet): 95 Feet Assistive device: Rolling walker (2 wheels) Gait Pattern/deviations: Decreased step length - left, Decreased step length - right, Decreased stride length, Shuffle, Step-through pattern Gait velocity: slightly decreased     General Gait Details: able to amb approx 95 ft using RW CGA for safety; occasional cuing required for AD management   Stairs             Wheelchair Mobility     Tilt Bed    Modified Rankin (Stroke Patients Only)       Balance Overall balance assessment: Needs assistance Sitting-balance support: No upper extremity supported, Feet supported Sitting balance-Leahy Scale: Good Sitting balance - Comments: able to maintain seated EOB balance during functioning activities CGA for safety   Standing balance support: Single extremity supported, During functional activity Standing balance-Leahy Scale: Fair Standing balance comment: able to sustain static standing balance using RW with mild sway and slight forward flex posture                            Cognition Arousal: Alert Behavior During Therapy: WFL for tasks assessed/performed Overall Cognitive Status: Within Functional Limits for tasks assessed                                 General Comments: AO x4; easily startled at the  beginning of session but otherwise pleasant and cooperative during therapy        Exercises      General Comments General comments (skin integrity, edema, etc.): SpO2 94% on RA throughout      Pertinent Vitals/Pain Pain Assessment Pain Assessment: No/denies pain    Home Living                          Prior Function            PT Goals (current goals can now be found in the care plan section) Acute Rehab PT Goals Patient Stated Goal: to go home PT Goal Formulation: With patient Time  For Goal Achievement: 10/16/22 Potential to Achieve Goals: Good Progress towards PT goals: Progressing toward goals    Frequency    Min 1X/week      PT Plan      Co-evaluation              AM-PAC PT "6 Clicks" Mobility   Outcome Measure  Help needed turning from your back to your side while in a flat bed without using bedrails?: A Little Help needed moving from lying on your back to sitting on the side of a flat bed without using bedrails?: A Little Help needed moving to and from a bed to a chair (including a wheelchair)?: A Little Help needed standing up from a chair using your arms (e.g., wheelchair or bedside chair)?: A Little Help needed to walk in hospital room?: A Little Help needed climbing 3-5 steps with a railing? : A Lot 6 Click Score: 17    End of Session Equipment Utilized During Treatment: Gait belt Activity Tolerance: Patient tolerated treatment well Patient left: in bed;with call bell/phone within reach;with bed alarm set;with nursing/sitter in room Nurse Communication: Mobility status PT Visit Diagnosis: Unsteadiness on feet (R26.81);Repeated falls (R29.6);Muscle weakness (generalized) (M62.81);History of falling (Z91.81)     Time: 4696-2952 PT Time Calculation (min) (ACUTE ONLY): 17 min  Charges:                            Elmon Else, SPT    Samarrah Tranchina 10/03/2022, 3:57 PM

## 2022-10-03 NOTE — Hospital Course (Addendum)
Taken from prior notes.  Alex Gomez is a 77 y.o. male with medical history significant for COPD/CAD, diabetes, HTN/ AVR/ seizures coming for SOB/DOE and orthopnea,Pt also has depression. And per reports pt has said he wants to die.Pt denies any SI.Wife at bedside. Spoke to  Wife at bedside gives history as patient has hearing difficulty states that patient has been weak over the past few days is no longer able to ambulate his coughing and short of breath.  Last echocardiogram noted a EF at 55%.  Patient does not have a history of heart failure however has had valve replacement and follows with cardiology at the [facility] clinic. Patient currently is alert awake oriented in no distress not using accessory muscles of respiration currently on nasal cannula. Initial vitals show patient's O2 sats at 88% on room air initially requiring 2 L of oxygen.  EKG done today shows sinus rhythm at 69 with normal axis normal intervals no ST-T wave changes.  Metabolic panel shows glucose of 128 sodium 134 normal kidney function hepatic function is added on, BNP elevated at 104 normal troponin.  Respiratory panel  negative for flu RSV and COVID.  Urinalysis is clear and yellow with no leukocytes and nitrate negative. Patient had a CT angio negative for pulmonary embolism and viability basilar atelectasis without effusion no other focal abnormality and aortic atherosclerosis.  Cardiology was also consulted but less likely an acute on chronic heart failure.  More likely deconditioning.  He was able to wean back to room air.  PT OT evaluated him and recommending SNF.  10/2: Developed mild hyponatremia, did receive an IV Lasix yesterday.  Clinically appears dry so giving some IV fluid.  Alcohol abuse can be contributory.  10/3: Vital stable, hyponatremia improving.  No bed offer yet.  Echocardiogram with normal EF, no regional wall motion abnormalities and grade 1 diastolic dysfunction.  Per wife patient uses CPAP to sleep at  night which was ordered.  Remained on 2 L of oxygen.  10/4: Vital stable, slight worsening of lower extremity edema, ordered 1 dose of 20 mg of Lasix. Pending SNF.  10/5: Persistent lower extremity edema-giving 40 mg of Lasix x 1.  Left elbow pain and patient was concerned about getting another gouty attack, restarting home allopurinol and colchicine.  Uric acid at 6.7.  10/6: Worsening right elbow pain with no obvious swelling or erythema.  Added 3 days of prednisone and lidocaine patch.  10/7: Continue to have right elbow pain, no obvious erythema or edema, unlikely gouty flare, may be arthritis flare.  Still awaiting SNF placement.  10/8: Hemodynamically stable.  Patient is being discharged to rehab for further management. Patient should be getting regular bowel regimen to avoid constipation, you can back off if started developing diarrhea.  Please encourage p.o. hydration.  Patient will continue on current medications and need to have a close follow-up with his providers for further recommendations.

## 2022-10-03 NOTE — Progress Notes (Signed)
Progress Note   Patient: Alex Gomez EXB:[id number] DOB: 07/24/1945 DOA: 10/01/2022     2 DOS: the patient was seen and examined on 10/03/2022   Brief hospital course:  Alex Gomez is a 77 y.o. male with medical history significant for COPD/CAD, diabetes, HTN/ AVR/ seizures coming for SOB/DOE and orthopnea,Pt also has depression. And per reports pt has said he wants to die.Pt denies any SI.Wife at bedside. Spoke to  Wife at bedside gives history as patient has hearing difficulty states that patient has been weak over the past few days is no longer able to ambulate his coughing and short of breath.  Last echocardiogram noted a EF at 55%.  Patient does not have a history of heart failure however has had valve replacement and follows with cardiology at the [facility] clinic. Patient currently is alert awake oriented in no distress not using accessory muscles of respiration currently on nasal cannula. Initial vitals show patient's O2 sats at 88% on room air with his weight of 229.4 pounds, respirations of 22 and blood pressure 149/69.  EKG done today shows sinus rhythm at 69 with normal axis normal intervals no ST-T wave changes.  Metabolic panel shows glucose of 128 sodium 134 normal kidney function hepatic function is added on, BNP elevated at 104 normal troponin.  Respiratory panel  negative for flu RSV and COVID.  Urinalysis is clear and yellow with no leukocytes and nitrate negative. Patient had a CT angio negative for pulmonary embolism and viability basilar atelectasis without effusion no other focal abnormality and aortic atherosclerosis.  10/2: Developed mild hyponatremia, did receive an IV Lasix yesterday.  Clinically appears dry so giving some IV fluid.  Alcohol abuse can be contributory.  Assessment and Plan:  Acute hypoxic respiratory failure Unclear etiology and may be secondary to possible acute COPD exacerbation No known history of CHF.  Noted to have bilateral lower extremity swelling,  exertional shortness of breath Obtain 2D echocardiogram to assess LVEF.  Cardiology consult CT angiogram showed no evidence of pulmonary emboli. Bibasilar atelectasis without effusion. Still on 2 L with no baseline oxygen use. -Continue supplemental oxygen-wean as tolerated  TIA rule out CVA Had an episode of aphasia that resolved Initial CT scan of the head was negative for bleed MRI of the brain shows no acute intracranial abnormality. Mild chronic small vessel ischemic disease with small chronic left frontal, left parietal, and bilateral cerebellar infarcts. Continue aspirin and statins  Hyponatremia.  Sodium at 129 today.  Patient did receive IV Lasix yesterday.  Likely multifactorial with diuretic and history of alcohol abuse.  Clinically appears dry.  Normal serum osmolality with mildly low urine osmolality -Giving some normal saline -Monitor sodium  COPD with acute exacerbation Patient with complaints of worsening shortness of breath from his baseline and also has a wet sounding cough Place patient on scheduled and as needed bronchodilator therapy Place patient antitussives Start inhaled steroid   Diabetes mellitus Maintain consistent carbohydrate diet Control sliding scale insulin  Thrombocytopenia No evidence of bleeding Most likely related to history of alcohol abuse  Seizure disorder Continue Keppra  Constipation.  Recent ED visit due to fecal impaction.  No bowel movement since Sunday. -Start him on regular bowel regimen  History of alcohol abuse Monitor closely for signs and symptoms of alcohol withdrawal Place patient on CIWA protocol and administer lorazepam for CIWA score of 8 or greater     Subjective: Patient was seen and examined today.  Denies any shortness of breath.  Wife at bedside was concerned about constipation as he has a recent ED visit due to fecal impaction.  Physical Exam: Vitals:   10/03/22 0204 10/03/22 0321 10/03/22 0746 10/03/22 1140   BP:  129/67 121/67 126/75  Pulse:   79 81  Resp:  20 18 16   Temp:  99.1 F (37.3 C) 98.3 F (36.8 C) 98.1 F (36.7 C)  TempSrc:  Oral Oral Oral  SpO2: 92% 92% 93% 97%  Weight:      Height:       General.  Frail elderly man, in no acute distress. Pulmonary.  Lungs clear bilaterally, normal respiratory effort. CV.  Regular rate and rhythm, no JVD, rub or murmur. Abdomen.  Soft, nontender, nondistended, BS positive. CNS.  Alert and oriented .  No focal neurologic deficit. Extremities.  No edema, no cyanosis, pulses intact and symmetrical. Psychiatry.  Judgment and insight appears normal.      Data Reviewed: Prior data reviewed  Family Communication: Discussed with patient and wife at bedside.  Disposition: Status is: Inpatient Remains inpatient appropriate because: Workup for acute respiratory failure  Planned Discharge Destination: SNF  Time spent: 42 minutes  This record has been created using Conservation officer, historic buildings. Errors have been sought and corrected,but may not always be located. Such creation errors do not reflect on the standard of care.   Author: Arnetha Courser, MD 10/03/2022 1:48 PM  For on call review [email or web address].

## 2022-10-03 NOTE — Progress Notes (Signed)
Occupational Therapy Treatment Patient Details Name: Alex Gomez MRN: [id number] DOB: 07-03-1945 Today's Date: 10/03/2022   History of present illness Alex Gomez is a 77 y.o. male with medical history significant for COPD/CAD, diabetes, HTN/ AVR/ seizures coming for SOB/DOE and orthopnea.  Presentation concerning for delirium due to polypharmacy.   OT comments  Mr Teply was seen for OT treatment on this date. Upon arrival to room pt reclined in bed, agreeable to tx. Pt requires MIN A + RW for ADL t/f ~20 ft, CGA standing grooming tasks. IV noted to be leaking, RN notified and in to address. Poor safety awareness,  cues for safe RW technique. SpO2 94% on RA t/o session. Pt making good progress toward goals, will continue to follow POC. Discharge recommendation remains appropriate.      If plan is discharge home, recommend the following:  A little help with walking and/or transfers;A little help with bathing/dressing/bathroom;Help with stairs or ramp for entrance   Equipment Recommendations  BSC/3in1    Recommendations for Other Services      Precautions / Restrictions Precautions Precautions: Fall Restrictions Weight Bearing Restrictions: No       Mobility Bed Mobility Overal bed mobility: Needs Assistance Bed Mobility: Supine to Sit     Supine to sit: Min assist, HOB elevated, Used rails          Transfers Overall transfer level: Needs assistance Equipment used: Rolling walker (2 wheels) Transfers: Sit to/from Stand Sit to Stand: Min assist                 Balance Overall balance assessment: Needs assistance Sitting-balance support: No upper extremity supported, Feet supported Sitting balance-Leahy Scale: Good     Standing balance support: Single extremity supported, During functional activity Standing balance-Leahy Scale: Fair                             ADL either performed or assessed with clinical judgement   ADL Overall ADL's :  Needs assistance/impaired                                       General ADL Comments: MIN A + RW for toilet t/f, CGA standing grooming tasks      Cognition Arousal: Alert Behavior During Therapy: WFL for tasks assessed/performed Overall Cognitive Status: Within Functional Limits for tasks assessed                                                General Comments SpO2 94% on RA t/o session    Pertinent Vitals/ Pain       Pain Assessment Pain Assessment: No/denies pain   Frequency  Min 1X/week        Progress Toward Goals  OT Goals(current goals can now be found in the care plan section)  Progress towards OT goals: Progressing toward goals  Acute Rehab OT Goals Patient Stated Goal: to go home OT Goal Formulation: With patient/family Time For Goal Achievement: 10/16/22 Potential to Achieve Goals: Good ADL Goals Pt Will Perform Grooming: with modified independence;standing Pt Will Perform Lower Body Dressing: sit to/from stand;with min assist;with caregiver independent in assisting Pt Will Transfer to Toilet: with modified independence;ambulating;regular height toilet  Plan      Co-evaluation                 AM-PAC OT "6 Clicks" Daily Activity     Outcome Measure   Help from another person eating meals?: None Help from another person taking care of personal grooming?: A Little Help from another person toileting, which includes using toliet, bedpan, or urinal?: A Lot Help from another person bathing (including washing, rinsing, drying)?: A Lot Help from another person to put on and taking off regular upper body clothing?: A Little Help from another person to put on and taking off regular lower body clothing?: A Lot 6 Click Score: 16    End of Session Equipment Utilized During Treatment: Rolling walker (2 wheels)  OT Visit Diagnosis: Other abnormalities of gait and mobility (R26.89);Muscle weakness (generalized)  (M62.81)   Activity Tolerance Patient tolerated treatment well   Patient Left in chair;with call bell/phone within reach;with chair alarm set;with family/visitor present   Nurse Communication Mobility status        Time: 1610-9604 OT Time Calculation (min): 28 min  Charges: OT General Charges $OT Visit: 1 Visit OT Treatments $Self Care/Home Management : 23-37 mins  Kathie Dike, M.S. OTR/L  10/03/22, 11:35 AM  ascom [phone]

## 2022-10-03 NOTE — Progress Notes (Signed)
2D Echocardiogram has been performed.  Lucendia Herrlich 10/03/2022, 4:45 PM

## 2022-10-03 NOTE — Progress Notes (Signed)
Initial Nutrition Assessment  DOCUMENTATION CODES:   Obesity unspecified  INTERVENTION:   -MVI with minerals daily -Ensure Max po daily, each supplement provides 150 kcal and 30 grams of protein.   -Magic cup TID with meals, each supplement provides 290 kcal and 9 grams of protein   NUTRITION DIAGNOSIS:   Increased nutrient needs related to chronic illness (COPD) as evidenced by estimated needs.  GOAL:   Patient will meet greater than or equal to 90% of their needs  MONITOR:   PO intake, Supplement acceptance  REASON FOR ASSESSMENT:   Consult Assessment of nutrition requirement/status  ASSESSMENT:   Pt with medical history significant for COPD/CAD, diabetes, HTN/ AVR/ seizures coming for SOB/DOE and orthopnea  Pt admitted with shortness of breath and COPD exacerbation.   Reviewed I/O's: +480 ml x 24 hours and -170 ml since admission  UOP: 500 ml x 24 hours  Per MD notes, plan for echo.   Spoke with pt and wife at bedside. Wife reports pt has had a poor appetite over the past week due to feeling poorly and constipation. Noted pt with abdominal distention. He was in the ED for constipation last week and was sent home with miralax and colace; wife shares this provides some relief, but "it takes awhile to work". No bowel movement over the past few days.   Prior to acute illness, pt with good appetite, consuming 3 meals per day (Breakfast: pancakes and eggs or cereal; Lunch: sandwich; Dinner: meat, starch, and vegetable). Pt also drinks either a fairlife or Premier Protein drink daily.   Reviewed wt hx; wt has been stable over the past 3 months. Pt shares he has lost about 12# intentionally over the past year due to eliminating "red meat and scotch" to help manage his gout.   Discussed importance of good meal and supplement intake to promote healing. Pt amenable to supplements.   Medications reviewed and include keppra, miralax, thiamine, and 0.9% sodium chloride infusion  @ 100 ml/hr.   Lab Results  Component Value Date   HGBA1C 7.5 (H) 10/02/2022   PTA DM medications are none.   Labs reviewed: Na: 129, CBGS: 103-131 (inpatient orders for glycemic control are 0-5 units insulin aspart daily at bedtime and 0-9 units insulin aspart TID with meals).    NUTRITION - FOCUSED PHYSICAL EXAM:  Flowsheet Row Most Recent Value  Orbital Region No depletion  Upper Arm Region No depletion  Thoracic and Lumbar Region No depletion  Buccal Region No depletion  Temple Region No depletion  Clavicle Bone Region No depletion  Clavicle and Acromion Bone Region No depletion  Scapular Bone Region No depletion  Dorsal Hand No depletion  Patellar Region No depletion  Anterior Thigh Region No depletion  Posterior Calf Region No depletion  Edema (RD Assessment) Moderate  Hair Reviewed  Eyes Reviewed  Mouth Reviewed  Skin Reviewed  Nails Reviewed       Diet Order:   Diet Order             Diet heart healthy/carb modified Room service appropriate? Yes; Fluid consistency: Thin; Fluid restriction: 1500 mL Fluid  Diet effective now                   EDUCATION NEEDS:   Education needs have been addressed  Skin:  Skin Assessment: Reviewed RN Assessment  Last BM:  09/30/22  Height:   Ht Readings from Last 1 Encounters:  10/01/22 6\' 1"  (1.854 m)    Weight:  Wt Readings from Last 1 Encounters:  10/01/22 104 kg    Ideal Body Weight:  83.6 kg  BMI:  Body mass index is 30.25 kg/m.  Estimated Nutritional Needs:   Kcal:  2100-2300  Protein:  105-120 grams  Fluid:  > 2 L    Levada Schilling, RD, LDN, CDCES Registered Dietitian III Certified Diabetes Care and Education Specialist Please refer to [facility] for RD and/or RD on-call/weekend/after hours pager

## 2022-10-03 NOTE — TOC Progression Note (Signed)
Transition of Care [facility]) - Progression Note    Patient Details  Name: Alex Gomez MRN: [id number] Date of Birth: 1945/03/09  Transition of Care [facility]) CM/SW Contact  Darolyn Rua, [place] Phone Number: 10/03/2022, 10:54 AM  Clinical Narrative:     CSW met with patient and spouse at bedside, agreeable to SNF at time of discharge with preference of [facility] and [facility].   CSW has sent out referrals pending bed offers.        Expected Discharge Plan and Services                                               Social Determinants of Health (SDOH) Interventions SDOH Screenings   Food Insecurity: No Food Insecurity (10/03/2022)  Housing: Low Risk  (10/03/2022)  Transportation Needs: No Transportation Needs (10/03/2022)  Utilities: Not At Risk (10/03/2022)  Depression (PHQ2-9): Medium Risk (08/20/2018)  Tobacco Use: Medium Risk (10/01/2022)    Readmission Risk Interventions    10/02/2022    9:50 AM  Readmission Risk Prevention Plan  Transportation Screening Complete  PCP or Specialist Appt within 3-5 Days Complete  HRI or Home Care Consult Complete  Social Work Consult for Recovery Care Planning/Counseling Complete  Palliative Care Screening Not Applicable  Medication Review Oceanographer) Not Complete  Med Review Comments Patient will review medication upon discharge.

## 2022-10-03 NOTE — Plan of Care (Signed)
Problem: Fluid Volume: Goal: Hemodynamic stability will improve Outcome: Progressing   Problem: Clinical Measurements: Goal: Diagnostic test results will improve Outcome: Progressing Goal: Signs and symptoms of infection will decrease Outcome: Progressing   Problem: Respiratory: Goal: Ability to maintain adequate ventilation will improve Outcome: Progressing   Problem: Education: Goal: Ability to describe self-care measures that may prevent or decrease complications (Diabetes Survival Skills Education) will improve Outcome: Progressing Goal: Individualized Educational Video(s) Outcome: Progressing   Problem: Coping: Goal: Ability to adjust to condition or change in health will improve Outcome: Progressing   Problem: Fluid Volume: Goal: Ability to maintain a balanced intake and output will improve Outcome: Progressing   Problem: Health Behavior/Discharge Planning: Goal: Ability to identify and utilize available resources and services will improve Outcome: Progressing Goal: Ability to manage health-related needs will improve Outcome: Progressing   Problem: Metabolic: Goal: Ability to maintain appropriate glucose levels will improve Outcome: Progressing   Problem: Nutritional: Goal: Maintenance of adequate nutrition will improve Outcome: Progressing Goal: Progress toward achieving an optimal weight will improve Outcome: Progressing   Problem: Skin Integrity: Goal: Risk for impaired skin integrity will decrease Outcome: Progressing   Problem: Tissue Perfusion: Goal: Adequacy of tissue perfusion will improve Outcome: Progressing   Problem: Education: Goal: Knowledge of General Education information will improve Description: Including pain rating scale, medication(s)/side effects and non-pharmacologic comfort measures Outcome: Progressing   Problem: Health Behavior/Discharge Planning: Goal: Ability to manage health-related needs will improve Outcome:  Progressing   Problem: Clinical Measurements: Goal: Ability to maintain clinical measurements within normal limits will improve Outcome: Progressing Goal: Will remain free from infection Outcome: Progressing Goal: Diagnostic test results will improve Outcome: Progressing Goal: Respiratory complications will improve Outcome: Progressing Goal: Cardiovascular complication will be avoided Outcome: Progressing   Problem: Activity: Goal: Risk for activity intolerance will decrease Outcome: Progressing   Problem: Nutrition: Goal: Adequate nutrition will be maintained Outcome: Progressing   Problem: Coping: Goal: Level of anxiety will decrease Outcome: Progressing   Problem: Elimination: Goal: Will not experience complications related to bowel motility Outcome: Progressing Goal: Will not experience complications related to urinary retention Outcome: Progressing   Problem: Pain Managment: Goal: General experience of comfort will improve Outcome: Progressing   Problem: Safety: Goal: Ability to remain free from injury will improve Outcome: Progressing   Problem: Skin Integrity: Goal: Risk for impaired skin integrity will decrease Outcome: Progressing   Problem: Education: Goal: Knowledge of General Education information will improve Description: Including pain rating scale, medication(s)/side effects and non-pharmacologic comfort measures Outcome: Progressing   Problem: Health Behavior/Discharge Planning: Goal: Ability to manage health-related needs will improve Outcome: Progressing   Problem: Clinical Measurements: Goal: Ability to maintain clinical measurements within normal limits will improve Outcome: Progressing Goal: Will remain free from infection Outcome: Progressing Goal: Diagnostic test results will improve Outcome: Progressing Goal: Respiratory complications will improve Outcome: Progressing Goal: Cardiovascular complication will be avoided Outcome:  Progressing   Problem: Activity: Goal: Risk for activity intolerance will decrease Outcome: Progressing   Problem: Nutrition: Goal: Adequate nutrition will be maintained Outcome: Progressing   Problem: Coping: Goal: Level of anxiety will decrease Outcome: Progressing   Problem: Elimination: Goal: Will not experience complications related to bowel motility Outcome: Progressing Goal: Will not experience complications related to urinary retention Outcome: Progressing   Problem: Pain Managment: Goal: General experience of comfort will improve Outcome: Progressing   Problem: Safety: Goal: Ability to remain free from injury will improve Outcome: Progressing   Problem: Skin Integrity: Goal: Risk for  impaired skin integrity will decrease Outcome: Progressing

## 2022-10-03 NOTE — Plan of Care (Signed)

## 2022-10-04 DIAGNOSIS — J9601 Acute respiratory failure with hypoxia: Secondary | ICD-10-CM | POA: Diagnosis not present

## 2022-10-04 DIAGNOSIS — R569 Unspecified convulsions: Secondary | ICD-10-CM | POA: Diagnosis not present

## 2022-10-04 DIAGNOSIS — I2510 Atherosclerotic heart disease of native coronary artery without angina pectoris: Secondary | ICD-10-CM | POA: Diagnosis not present

## 2022-10-04 DIAGNOSIS — I10 Essential (primary) hypertension: Secondary | ICD-10-CM | POA: Diagnosis not present

## 2022-10-04 LAB — CBC
HCT: 33.8 % — ABNORMAL LOW (ref 39.0–52.0)
Hemoglobin: 10.7 g/dL — ABNORMAL LOW (ref 13.0–17.0)
MCH: 25.5 pg — ABNORMAL LOW (ref 26.0–34.0)
MCHC: 31.7 g/dL (ref 30.0–36.0)
MCV: 80.5 fL (ref 80.0–100.0)
Platelets: 66 10*3/uL — ABNORMAL LOW (ref 150–400)
RBC: 4.2 MIL/uL — ABNORMAL LOW (ref 4.22–5.81)
RDW: 19.9 % — ABNORMAL HIGH (ref 11.5–15.5)
WBC: 7.9 10*3/uL (ref 4.0–10.5)
nRBC: 0 % (ref 0.0–0.2)

## 2022-10-04 LAB — ECHOCARDIOGRAM COMPLETE
AR max vel: 0.96 cm2
AV Area VTI: 0.96 cm2
AV Area mean vel: 0.91 cm2
AV Mean grad: 12.2 mm[Hg]
AV Peak grad: 23.1 mm[Hg]
Ao pk vel: 2.4 m/s
Area-P 1/2: 2.39 cm2
Height: 73 in
MV M vel: 3.17 m/s
MV Peak grad: 40.2 mm[Hg]
MV VTI: 0.93 cm2
S' Lateral: 3.35 cm
Weight: 3668.45 [oz_av]

## 2022-10-04 LAB — GLUCOSE, CAPILLARY
Glucose-Capillary: 176 mg/dL — ABNORMAL HIGH (ref 70–99)
Glucose-Capillary: 164 mg/dL — ABNORMAL HIGH (ref 70–99)
Glucose-Capillary: 155 mg/dL — ABNORMAL HIGH (ref 70–99)
Glucose-Capillary: 147 mg/dL — ABNORMAL HIGH (ref 70–99)

## 2022-10-04 LAB — BASIC METABOLIC PANEL
Anion gap: 5 (ref 5–15)
BUN: 20 mg/dL (ref 8–23)
CO2: 29 mmol/L (ref 22–32)
Calcium: 7.9 mg/dL — ABNORMAL LOW (ref 8.9–10.3)
Chloride: 99 mmol/L (ref 98–111)
Creatinine, Ser: 0.77 mg/dL (ref 0.61–1.24)
GFR, Estimated: >60 mL/min (ref >60)
Glucose, Bld: 130 mg/dL — ABNORMAL HIGH (ref 70–99)
Potassium: 3.8 mmol/L (ref 3.5–5.1)
Sodium: 133 mmol/L — ABNORMAL LOW (ref 135–145)

## 2022-10-04 NOTE — TOC Progression Note (Signed)
Transition of Care [facility]) - Progression Note    Patient Details  Name: JOHAN CREVELING MRN: [id number] Date of Birth: 1945-05-12  [facility]) CM/SW Contact  Darolyn Rua, [place] Phone Number: 10/04/2022, 12:26 PM  Clinical Narrative:     CSW has re sent out bed offers, pending offers at this time.        Expected Discharge Plan and Services                                               Social Determinants of Health (SDOH) Interventions SDOH Screenings   Food Insecurity: No Food Insecurity (10/03/2022)  Housing: Low Risk  (10/03/2022)  Transportation Needs: No Transportation Needs (10/03/2022)  Utilities: Not At Risk (10/03/2022)  Depression (PHQ2-9): Medium Risk (08/20/2018)  Tobacco Use: Medium Risk (10/01/2022)    Readmission Risk Interventions    10/02/2022    9:50 AM  Readmission Risk Prevention Plan  Transportation Screening Complete  PCP or Specialist Appt within 3-5 Days Complete  HRI or Home Care Consult Complete  Social Work Consult for Recovery Care Planning/Counseling Complete  Palliative Care Screening Not Applicable  Medication Review Oceanographer) Not Complete  Med Review Comments Patient will review medication upon discharge.

## 2022-10-04 NOTE — Plan of Care (Signed)
  Problem: Coping: Goal: Level of anxiety will decrease Outcome: Progressing   Problem: Pain Managment: Goal: General experience of comfort will improve Outcome: Progressing   Problem: Safety: Goal: Ability to remain free from injury will improve Outcome: Progressing   Problem: Skin Integrity: Goal: Risk for impaired skin integrity will decrease Outcome: Progressing   

## 2022-10-04 NOTE — Progress Notes (Signed)
Progress Note   Patient: Alex Gomez UUV:[id number] DOB: 1945-05-07 DOA: 10/01/2022     3 DOS: the patient was seen and examined on 10/04/2022   Brief hospital course:  Alex Gomez is a 77 y.o. male with medical history significant for COPD/CAD, diabetes, HTN/ AVR/ seizures coming for SOB/DOE and orthopnea,Pt also has depression. And per reports pt has said he wants to die.Pt denies any SI.Wife at bedside. Spoke to  Wife at bedside gives history as patient has hearing difficulty states that patient has been weak over the past few days is no longer able to ambulate his coughing and short of breath.  Last echocardiogram noted a EF at 55%.  Patient does not have a history of heart failure however has had valve replacement and follows with cardiology at the [facility] clinic. Patient currently is alert awake oriented in no distress not using accessory muscles of respiration currently on nasal cannula. Initial vitals show patient's O2 sats at 88% on room air with his weight of 229.4 pounds, respirations of 22 and blood pressure 149/69.  EKG done today shows sinus rhythm at 69 with normal axis normal intervals no ST-T wave changes.  Metabolic panel shows glucose of 128 sodium 134 normal kidney function hepatic function is added on, BNP elevated at 104 normal troponin.  Respiratory panel  negative for flu RSV and COVID.  Urinalysis is clear and yellow with no leukocytes and nitrate negative. Patient had a CT angio negative for pulmonary embolism and viability basilar atelectasis without effusion no other focal abnormality and aortic atherosclerosis.  10/2: Developed mild hyponatremia, did receive an IV Lasix yesterday.  Clinically appears dry so giving some IV fluid.  Alcohol abuse can be contributory.  10/3: Vital stable, hyponatremia improving.  No bed offer yet.  Echocardiogram with normal EF, no regional wall motion abnormalities and grade 1 diastolic dysfunction.  Per wife patient uses CPAP to sleep at night  which was ordered.  Remained on 2 L of oxygen  Assessment and Plan:  Acute hypoxic respiratory failure Unclear etiology and may be secondary to possible acute COPD exacerbation No known history of CHF.  Noted to have bilateral lower extremity swelling, exertional shortness of breath Obtain 2D echocardiogram to assess LVEF.  Cardiology consult CT angiogram showed no evidence of pulmonary emboli. Bibasilar atelectasis without effusion. Still on 2 L with no baseline oxygen use. -Continue supplemental oxygen-wean as tolerated  TIA rule out CVA Had an episode of aphasia that resolved Initial CT scan of the head was negative for bleed MRI of the brain shows no acute intracranial abnormality. Mild chronic small vessel ischemic disease with small chronic left frontal, left parietal, and bilateral cerebellar infarcts. Continue aspirin and statins  Hyponatremia.  Improving with sodium at 133.  Patient did receive IV Lasix yesterday.  Likely multifactorial with diuretic and history of alcohol abuse.  Clinically appears dry.  Normal serum osmolality with mildly low urine osmolality -Monitor sodium  COPD with acute exacerbation Patient with complaints of worsening shortness of breath from his baseline and also has a wet sounding cough Place patient on scheduled and as needed bronchodilator therapy Place patient antitussives Start inhaled steroid   Diabetes mellitus Maintain consistent carbohydrate diet Control sliding scale insulin  Thrombocytopenia No evidence of bleeding Most likely related to history of alcohol abuse  Seizure disorder Continue Keppra  Constipation.  Recent ED visit due to fecal impaction.  No bowel movement since Sunday. -Start him on regular bowel regimen  History of  alcohol abuse Monitor closely for signs and symptoms of alcohol withdrawal Place patient on CIWA protocol and administer lorazepam for CIWA score of 8 or greater     Subjective: Patient was  sleeping when seen today, wife at bedside requested to not disturb as he had a rough night.  Per wife he uses CPAP at home which was reordered.  Physical Exam: Vitals:   10/04/22 0008 10/04/22 0526 10/04/22 0757 10/04/22 1249  BP: 129/70 137/70 (!) 117/55 (!) 120/55  Pulse: 93 83 74 74  Resp:   16 20  Temp: 98.3 F (36.8 C) 97.6 F (36.4 C) 98.5 F (36.9 C)   TempSrc:   Oral   SpO2: 90% 91% 93% 96%  Weight:      Height:       General.  Frail elderly man, in no acute distress. Pulmonary.  Lungs clear bilaterally, normal respiratory effort. CV.  Regular rate and rhythm, no JVD, rub or murmur. Abdomen.  Soft, nontender, nondistended, BS positive. CNS.  Somnolent, no apparent focal deficit Extremities.  Trace LE edema, no cyanosis, pulses intact and symmetrical.    Data Reviewed: Prior data reviewed  Family Communication: Discussed with patient and wife at bedside.  Disposition: Status is: Inpatient Remains inpatient appropriate because: Workup for acute respiratory failure  Planned Discharge Destination: SNF  Time spent: 40 minutes  This record has been created using Conservation officer, historic buildings. Errors have been sought and corrected,but may not always be located. Such creation errors do not reflect on the standard of care.   Author: Arnetha Courser, MD 10/04/2022 4:49 PM  For on call review [email or web address].

## 2022-10-04 NOTE — Progress Notes (Signed)
OT Cancellation Note  Patient Details Name: JOE GEE MRN: [id number] DOB: Jun 12, 1945   Cancelled Treatment:    Reason Eval/Treat Not Completed: Fatigue/lethargy limiting ability to participate (Pt. sleeping upon arrival. Pt. family requesting to reattempt at a later time as Pt. is sleeping soundly. Will reattempt at a later time or date.)  Olegario Messier, MS, OTR/L 10/04/2022, 3:34 PM

## 2022-10-04 NOTE — Progress Notes (Signed)
Physical Therapy Treatment Patient Details Name: Alex Gomez MRN: [id number] DOB: Apr 27, 1945 Today's Date: 10/04/2022   History of Present Illness Alex Gomez is a 77 y.o. male with medical history significant for COPD/CAD, diabetes, HTN/ AVR/ seizures coming for SOB/DOE and orthopnea.  Presentation concerning for delirium due to polypharmacy.    PT Comments  Pt was asleep upon arrival with supportive spouse at bedside. She states," He has been off and on sleeping all day. Im worried he isn't going to sleep any tonight. Pt easily awakes and was able to stay awake throughout session. He is A and O but HOH . Slow processing noted. Alex Gomez was on 2 L o2 upon arriving to room but was placed on just rm air throughout session with sao2 > 88%. RN tech and RN made aware pt was being left off O2 post session. Pt tolerated getting OOB and ambulating ~ 120 ft without LOB however poor posture and narrow BOS. Pt is at high fall risk without use of RW. He reports he did not use RW much at baseline. DC recs remain appropriate to maximize his independence and safety with all ADLs while progressing him to PLOF.    If plan is discharge home, recommend the following: A little help with walking and/or transfers;A lot of help with bathing/dressing/bathroom;Assist for transportation;Help with stairs or ramp for entrance     Equipment Recommendations  Other (comment) (Defer to next level of care)       Precautions / Restrictions Precautions Precautions: Fall Restrictions Weight Bearing Restrictions: No     Mobility  Bed Mobility Overal bed mobility: Needs Assistance Bed Mobility: Supine to Sit, Sit to Supine  Supine to sit: Min assist, Mod assist, Used rails Sit to supine: Min assist, HOB elevated, Used rails   Transfers Overall transfer level: Needs assistance Equipment used: Rolling walker (2 wheels) Transfers: Sit to/from Stand Sit to Stand: Contact guard assist        Ambulation/Gait Ambulation/Gait assistance: Contact guard assist Gait Distance (Feet): 120 Feet Assistive device: Rolling walker (2 wheels) Gait Pattern/deviations: Step-through pattern, Narrow base of support, Trunk flexed Gait velocity: slightly decreased  General Gait Details: Pt was on 2 L o2 prior to arrival. His O2 was sitting on 94% with 2 L. Discontinued O2 throughout session with sao2 > 88% on rm air. pt ambulated 120 ft with CGA for safety with VCs foir upright posture and wider BOS   Balance Overall balance assessment: Needs assistance Sitting-balance support: No upper extremity supported, Feet supported Sitting balance-Leahy Scale: Good     Standing balance support: Bilateral upper extremity supported, During functional activity, Reliant on assistive device for balance Standing balance-Leahy Scale: Fair       Cognition Arousal: Alert Behavior During Therapy: WFL for tasks assessed/performed Overall Cognitive Status: Within Functional Limits for tasks assessed    General Comments: Pt is A and O but HOH with increased time to respond to questions. PLeasant throughout           General Comments General comments (skin integrity, edema, etc.): Discusssed post acute POC and pt is agreeable to STR      Pertinent Vitals/Pain Pain Assessment Pain Assessment: No/denies pain     PT Goals (current goals can now be found in the care plan section) Acute Rehab PT Goals Patient Stated Goal: to go home Progress towards PT goals: Progressing toward goals    Frequency    Min 1X/week       AM-PAC PT "  6 Clicks" Mobility   Outcome Measure  Help needed turning from your back to your side while in a flat bed without using bedrails?: A Little Help needed moving from lying on your back to sitting on the side of a flat bed without using bedrails?: A Little Help needed moving to and from a bed to a chair (including a wheelchair)?: A Little Help needed standing up from a  chair using your arms (e.g., wheelchair or bedside chair)?: A Little Help needed to walk in hospital room?: A Little Help needed climbing 3-5 steps with a railing? : A Lot 6 Click Score: 17    End of Session   Activity Tolerance: Patient tolerated treatment well Patient left: in bed;with call bell/phone within reach;with bed alarm set;with nursing/sitter in room Nurse Communication: Mobility status PT Visit Diagnosis: Unsteadiness on feet (R26.81);Repeated falls (R29.6);Muscle weakness (generalized) (M62.81);History of falling (Z91.81)     Time: 1610-9604 PT Time Calculation (min) (ACUTE ONLY): 17 min  Charges:    $Gait Training: 8-22 mins PT General Charges $$ ACUTE PT VISIT: 1 Visit                    Jetta Lout PTA 10/04/22, 5:10 PM

## 2022-10-05 DIAGNOSIS — I10 Essential (primary) hypertension: Secondary | ICD-10-CM | POA: Diagnosis not present

## 2022-10-05 DIAGNOSIS — R569 Unspecified convulsions: Secondary | ICD-10-CM | POA: Diagnosis not present

## 2022-10-05 DIAGNOSIS — R0602 Shortness of breath: Secondary | ICD-10-CM | POA: Diagnosis not present

## 2022-10-05 DIAGNOSIS — I2510 Atherosclerotic heart disease of native coronary artery without angina pectoris: Secondary | ICD-10-CM | POA: Diagnosis not present

## 2022-10-05 LAB — BASIC METABOLIC PANEL
Anion gap: 5 (ref 5–15)
BUN: 22 mg/dL (ref 8–23)
CO2: 28 mmol/L (ref 22–32)
Calcium: 8 mg/dL — ABNORMAL LOW (ref 8.9–10.3)
Chloride: 99 mmol/L (ref 98–111)
Creatinine, Ser: 0.79 mg/dL (ref 0.61–1.24)
GFR, Estimated: >60 mL/min (ref >60)
Glucose, Bld: 135 mg/dL — ABNORMAL HIGH (ref 70–99)
Potassium: 4.2 mmol/L (ref 3.5–5.1)
Sodium: 132 mmol/L — ABNORMAL LOW (ref 135–145)

## 2022-10-05 LAB — GLUCOSE, CAPILLARY
Glucose-Capillary: 119 mg/dL — ABNORMAL HIGH (ref 70–99)
Glucose-Capillary: 167 mg/dL — ABNORMAL HIGH (ref 70–99)
Glucose-Capillary: 102 mg/dL — ABNORMAL HIGH (ref 70–99)
Glucose-Capillary: 167 mg/dL — ABNORMAL HIGH (ref 70–99)
Glucose-Capillary: 158 mg/dL — ABNORMAL HIGH (ref 70–99)

## 2022-10-05 MED ORDER — FUROSEMIDE 10 MG/ML IJ SOLN
20.0000 mg | Freq: Once | INTRAMUSCULAR | Status: AC
  Administered 2022-10-05: 20 mg via INTRAVENOUS
  Filled 2022-10-05: qty 2

## 2022-10-05 NOTE — Progress Notes (Signed)
Progress Note   Patient: Alex Gomez [id number]:[id number] DOB: 03-28-1945 DOA: 10/01/2022     4 DOS: the patient was seen and examined on 10/05/2022   Brief hospital course:  Alex Gomez is a 77 y.o. male with medical history significant for COPD/CAD, diabetes, HTN/ AVR/ seizures coming for SOB/DOE and orthopnea,Pt also has depression. And per reports pt has said he wants to die.Pt denies any SI.Wife at bedside. Spoke to  Wife at bedside gives history as patient has hearing difficulty states that patient has been weak over the past few days is no longer able to ambulate his coughing and short of breath.  Last echocardiogram noted a EF at 55%.  Patient does not have a history of heart failure however has had valve replacement and follows with cardiology at the [facility] clinic. Patient currently is alert awake oriented in no distress not using accessory muscles of respiration currently on nasal cannula. Initial vitals show patient's O2 sats at 88% on room air with his weight of 229.4 pounds, respirations of 22 and blood pressure 149/69.  EKG done today shows sinus rhythm at 69 with normal axis normal intervals no ST-T wave changes.  Metabolic panel shows glucose of 128 sodium 134 normal kidney function hepatic function is added on, BNP elevated at 104 normal troponin.  Respiratory panel  negative for flu RSV and COVID.  Urinalysis is clear and yellow with no leukocytes and nitrate negative. Patient had a CT angio negative for pulmonary embolism and viability basilar atelectasis without effusion no other focal abnormality and aortic atherosclerosis.  10/2: Developed mild hyponatremia, did receive an IV Lasix yesterday.  Clinically appears dry so giving some IV fluid.  Alcohol abuse can be contributory.  10/3: Vital stable, hyponatremia improving.  No bed offer yet.  Echocardiogram with normal EF, no regional wall motion abnormalities and grade 1 diastolic dysfunction.  Per wife patient uses CPAP to sleep at night  which was ordered.  Remained on 2 L of oxygen  10/4: Vital stable, slight worsening of lower extremity edema, ordered 1 dose of 20 mg of Lasix. Pending SNF.  Assessment and Plan:  Acute hypoxic respiratory failure Unclear etiology and may be secondary to possible acute COPD exacerbation No known history of CHF.  Noted to have bilateral lower extremity swelling, exertional shortness of breath Obtain 2D echocardiogram to assess LVEF.  Cardiology consult CT angiogram showed no evidence of pulmonary emboli. Bibasilar atelectasis without effusion. Still on 2 L with no baseline oxygen use. -Continue supplemental oxygen-wean as tolerated  TIA rule out CVA Had an episode of aphasia that resolved Initial CT scan of the head was negative for bleed MRI of the brain shows no acute intracranial abnormality. Mild chronic small vessel ischemic disease with small chronic left frontal, left parietal, and bilateral cerebellar infarcts. Continue aspirin and statins  Hyponatremia.  Improving with sodium at 132.  Patient did receive IV Lasix yesterday.  Likely multifactorial with diuretic and history of alcohol abuse.  Clinically appears dry.  Normal serum osmolality with mildly low urine osmolality -Monitor sodium  COPD with acute exacerbation Patient with complaints of worsening shortness of breath from his baseline and also has a wet sounding cough Place patient on scheduled and as needed bronchodilator therapy Place patient antitussives Start inhaled steroid   Diabetes mellitus Maintain consistent carbohydrate diet Control sliding scale insulin  Thrombocytopenia No evidence of bleeding Most likely related to history of alcohol abuse  Seizure disorder Continue Keppra  Constipation.  Recent ED  visit due to fecal impaction.  No bowel movement since Sunday. -Continue with bowel regimen  History of alcohol abuse Monitor closely for signs and symptoms of alcohol withdrawal Place patient on  CIWA protocol and administer lorazepam for CIWA score of 8 or greater     Subjective: Patient was seen and examined today.  No new concern.  Wife was concerned that he did not had any bowel movement yesterday, last recorded bowel movement was 10/03/2022.  No abdominal pain  Physical Exam: Vitals:   10/04/22 2334 10/05/22 0504 10/05/22 0814 10/05/22 1121  BP: (!) 109/54 (!) 111/53 131/63 128/60  Pulse: 88 84 86 71  Resp: 20 18 (!) 28 (!) 24  Temp: 98.4 F (36.9 C) 98.2 F (36.8 C) 98.8 F (37.1 C) 98 F (36.7 C)  TempSrc:   Oral Oral  SpO2: (!) 88% 92% (!) 85% 95%  Weight:      Height:       General.  Frail elderly man, in no acute distress. Pulmonary.  Lungs clear bilaterally, normal respiratory effort. CV.  Regular rate and rhythm, no JVD, rub or murmur. Abdomen.  Soft, nontender, nondistended, BS positive. CNS.  Alert and oriented .  No focal neurologic deficit. Extremities.  1+ LE edema, no cyanosis, pulses intact and symmetrical.    Data Reviewed: Prior data reviewed  Family Communication: Discussed with patient and wife at bedside.  Disposition: Status is: Inpatient Remains inpatient appropriate because: Workup for acute respiratory failure  Planned Discharge Destination: SNF  Time spent: 39 minutes  This record has been created using Conservation officer, historic buildings. Errors have been sought and corrected,but may not always be located. Such creation errors do not reflect on the standard of care.   Author: Arnetha Courser, MD 10/05/2022 3:59 PM  For on call review [email or web address].

## 2022-10-05 NOTE — TOC Progression Note (Addendum)
Transition of Care [facility]) - Progression Note    Patient Details  Name: Alex Gomez MRN: [id number] Date of Birth: 27-May-1945  Transition of Care [facility]) CM/SW Contact  Liliana Cline, LCSW Phone Number: 10/05/2022, 10:51 AM  Clinical Narrative:    Met with spouse at bedside, patient asleep.  Provided current bed offers [facility] and [facility]). She declines these. She states they prefer [facility] or [facility]. Explained these responded that they are out of network. She stated patient also has a Patent examiner as well as traditional Medicare. She states she would like this added to the chart and then the referral re-sent to [facility]. CSW asked Rob with Registration to verify coverage and add additional insurances to patient's chart.  CSW asked Sue Lush at [facility] and Elmarie Shiley at [facility] if they can re-consider patient once insurance is updated - Sue Lush states she has no bed availability. Tiffany states she will review patient referral again.        Expected Discharge Plan and Services                                               Social Determinants of Health (SDOH) Interventions SDOH Screenings   Food Insecurity: No Food Insecurity (10/03/2022)  Housing: Low Risk  (10/03/2022)  Transportation Needs: No Transportation Needs (10/03/2022)  Utilities: Not At Risk (10/03/2022)  Depression (PHQ2-9): Medium Risk (08/20/2018)  Tobacco Use: Medium Risk (10/01/2022)    Readmission Risk Interventions    10/02/2022    9:50 AM  Readmission Risk Prevention Plan  Transportation Screening Complete  PCP or Specialist Appt within 3-5 Days Complete  HRI or Home Care Consult Complete  Social Work Consult for Recovery Care Planning/Counseling Complete  Palliative Care Screening Not Applicable  Medication Review Oceanographer) Not Complete  Med Review Comments Patient will review medication upon discharge.

## 2022-10-06 DIAGNOSIS — R569 Unspecified convulsions: Secondary | ICD-10-CM | POA: Diagnosis not present

## 2022-10-06 DIAGNOSIS — R0602 Shortness of breath: Secondary | ICD-10-CM | POA: Diagnosis not present

## 2022-10-06 DIAGNOSIS — I10 Essential (primary) hypertension: Secondary | ICD-10-CM | POA: Diagnosis not present

## 2022-10-06 DIAGNOSIS — M109 Gout, unspecified: Secondary | ICD-10-CM

## 2022-10-06 DIAGNOSIS — I2510 Atherosclerotic heart disease of native coronary artery without angina pectoris: Secondary | ICD-10-CM | POA: Diagnosis not present

## 2022-10-06 LAB — GLUCOSE, CAPILLARY
Glucose-Capillary: 117 mg/dL — ABNORMAL HIGH (ref 70–99)
Glucose-Capillary: 157 mg/dL — ABNORMAL HIGH (ref 70–99)
Glucose-Capillary: 135 mg/dL — ABNORMAL HIGH (ref 70–99)
Glucose-Capillary: 123 mg/dL — ABNORMAL HIGH (ref 70–99)

## 2022-10-06 LAB — BASIC METABOLIC PANEL
Anion gap: 7 (ref 5–15)
BUN: 23 mg/dL (ref 8–23)
CO2: 27 mmol/L (ref 22–32)
Calcium: 7.9 mg/dL — ABNORMAL LOW (ref 8.9–10.3)
Chloride: 98 mmol/L (ref 98–111)
Creatinine, Ser: 0.88 mg/dL (ref 0.61–1.24)
GFR, Estimated: >60 mL/min (ref >60)
Glucose, Bld: 129 mg/dL — ABNORMAL HIGH (ref 70–99)
Potassium: 3.8 mmol/L (ref 3.5–5.1)
Sodium: 132 mmol/L — ABNORMAL LOW (ref 135–145)

## 2022-10-06 LAB — URIC ACID: Uric Acid, Serum: 6.7 mg/dL (ref 3.7–8.6)

## 2022-10-06 MED ORDER — ALLOPURINOL 100 MG PO TABS
300.0000 mg | ORAL_TABLET | Freq: Every day | ORAL | Status: DC
  Administered 2022-10-06 – 2022-10-10 (×5): 300 mg via ORAL
  Filled 2022-10-06 (×5): qty 3

## 2022-10-06 MED ORDER — COLCHICINE 0.6 MG PO TABS
0.6000 mg | ORAL_TABLET | Freq: Two times a day (BID) | ORAL | Status: DC
  Administered 2022-10-06 – 2022-10-10 (×9): 0.6 mg via ORAL
  Filled 2022-10-06 (×9): qty 1

## 2022-10-06 MED ORDER — FUROSEMIDE 10 MG/ML IJ SOLN
40.0000 mg | Freq: Once | INTRAMUSCULAR | Status: AC
  Administered 2022-10-06: 40 mg via INTRAVENOUS
  Filled 2022-10-06: qty 4

## 2022-10-06 MED ORDER — THIAMINE MONONITRATE 100 MG PO TABS
100.0000 mg | ORAL_TABLET | Freq: Every day | ORAL | Status: DC
  Administered 2022-10-06 – 2022-10-09 (×4): 100 mg via ORAL
  Filled 2022-10-06 (×4): qty 1

## 2022-10-06 NOTE — Plan of Care (Signed)
  Problem: Skin Integrity: Goal: Risk for impaired skin integrity will decrease Outcome: Progressing   Problem: Pain Managment: Goal: General experience of comfort will improve Outcome: Progressing   Problem: Safety: Goal: Ability to remain free from injury will improve Outcome: Progressing   Problem: Skin Integrity: Goal: Risk for impaired skin integrity will decrease Outcome: Progressing

## 2022-10-06 NOTE — Assessment & Plan Note (Signed)
Able to wean back to room air today. -Continue to monitor -Ambulate with pulse ox

## 2022-10-06 NOTE — Progress Notes (Signed)
   10/06/22 0900  Spiritual Encounters  Type of Visit Initial  Care provided to: Family  Reason for visit Routine spiritual support  OnCall Visit Yes   Chaplain engaged with patients wife who requested a case worker to talk about rehab for her husband. Chaplain communicated this to the charge nurse.

## 2022-10-06 NOTE — Assessment & Plan Note (Signed)
Patient was complaining of left elbow pain, stating it seems like another gout attack. No erythema or edema noted on exam.  Uric acid 6.7 -Restarting home colchicine and allopurinol -Added prednisone 50 mg for 3 days -Lidocaine patch

## 2022-10-06 NOTE — Assessment & Plan Note (Signed)
Pt coming with SOB/ DOE / respiratory failure and hypoxia with SpO2: (!) 88 % O2 Flow Rate (L/min): 2 L/min FiO2 (%): 21 % on    CTA negative and suspect from new CHF.

## 2022-10-06 NOTE — Progress Notes (Signed)
Progress Note   Patient: Alex Gomez [id number] DOB: [birth year] DOA: 10/01/2022     5 DOS: the patient was seen and examined on 10/06/2022   Brief hospital course:  Alex Gomez is a 77 y.o. male with medical history significant for COPD/CAD, diabetes, HTN/ AVR/ seizures coming for SOB/DOE and orthopnea,Pt also has depression. And per reports pt has said he wants to die.Pt denies any SI.Wife at bedside. Spoke to  Wife at bedside gives history as patient has hearing difficulty states that patient has been weak over the past few days is no longer able to ambulate his coughing and short of breath.  Last echocardiogram noted a EF at 55%.  Patient does not have a history of heart failure however has had valve replacement and follows with cardiology at the [facility] clinic. Patient currently is alert awake oriented in no distress not using accessory muscles of respiration currently on nasal cannula. Initial vitals show patient's O2 sats at 88% on room air with his weight of 229.4 pounds, respirations of 22 and blood pressure 149/69.  EKG done today shows sinus rhythm at 69 with normal axis normal intervals no ST-T wave changes.  Metabolic panel shows glucose of 128 sodium 134 normal kidney function hepatic function is added on, BNP elevated at 104 normal troponin.  Respiratory panel  negative for flu RSV and COVID.  Urinalysis is clear and yellow with no leukocytes and nitrate negative. Patient had a CT angio negative for pulmonary embolism and viability basilar atelectasis without effusion no other focal abnormality and aortic atherosclerosis.  10/2: Developed mild hyponatremia, did receive an IV Lasix yesterday.  Clinically appears dry so giving some IV fluid.  Alcohol abuse can be contributory.  10/3: Vital stable, hyponatremia improving.  No bed offer yet.  Echocardiogram with normal EF, no regional wall motion abnormalities and grade 1 diastolic dysfunction.  Per wife patient uses CPAP to sleep at night  which was ordered.  Remained on 2 L of oxygen  10/4: Vital stable, slight worsening of lower extremity edema, ordered 1 dose of 20 mg of Lasix. Pending SNF.  10/5: Persistent lower extremity edema-giving 40 mg of Lasix x 1.  Left elbow pain and patient was concerned about getting another gouty attack, restarting home allopurinol and colchicine.  Uric acid at 6.7.  Assessment and Plan:  Acute hypoxic respiratory failure Unclear etiology and may be secondary to possible acute COPD exacerbation No known history of CHF.  Noted to have bilateral lower extremity swelling, exertional shortness of breath Obtain 2D echocardiogram to assess LVEF.  Cardiology consult CT angiogram showed no evidence of pulmonary emboli. Bibasilar atelectasis without effusion. Able to wean back to room air. -Continue to monitor and supplemental oxygen as needed  Gout. Patient was complaining of left elbow pain, stating it seems like another gout attack. No erythema or edema noted on exam.  Uric acid 6.7 -Restarting home colchicine and allopurinol  TIA rule out CVA Had an episode of aphasia that resolved Initial CT scan of the head was negative for bleed MRI of the brain shows no acute intracranial abnormality. Mild chronic small vessel ischemic disease with small chronic left frontal, left parietal, and bilateral cerebellar infarcts. Continue aspirin and statins  Hyponatremia.  Improving with sodium at 132.  Patient did receive IV Lasix yesterday.  Likely multifactorial with diuretic and history of alcohol abuse.  Clinically appears dry.  Normal serum osmolality with mildly low urine osmolality -Monitor sodium  COPD with acute exacerbation Patient  with complaints of worsening shortness of breath from his baseline and also has a wet sounding cough Place patient on scheduled and as needed bronchodilator therapy Place patient antitussives Start inhaled steroid   Diabetes mellitus Maintain consistent  carbohydrate diet Control sliding scale insulin  Thrombocytopenia No evidence of bleeding Most likely related to history of alcohol abuse  Seizure disorder Continue Keppra  Constipation.  Recent ED visit due to fecal impaction.  No bowel movement since Sunday. -Continue with bowel regimen  History of alcohol abuse Monitor closely for signs and symptoms of alcohol withdrawal Place patient on CIWA protocol and administer lorazepam for CIWA score of 8 or greater     Subjective: Patient was complaining of left elbow pain stating that it is seem like he is getting another gout attack.  Physical Exam: Vitals:   10/06/22 0035 10/06/22 0606 10/06/22 0742 10/06/22 1143  BP: 114/63 131/64 (!) 154/66 (!) 142/64  Pulse: 78 80 73 80  Resp: 20 20 16 18   Temp: 98.4 F (36.9 C) 98.3 F (36.8 C) 97.8 F (36.6 C) 98.5 F (36.9 C)  TempSrc:    Oral  SpO2: 90% 91% 90% (!) 88%  Weight:      Height:       General.  Frail elderly man, in no acute distress. Pulmonary.  Lungs clear bilaterally, normal respiratory effort. CV.  Regular rate and rhythm, no JVD, rub or murmur. Abdomen.  Soft, nontender, nondistended, BS positive. CNS.  Alert and oriented .  No focal neurologic deficit. Extremities.  1+ LE edema, no cyanosis, pulses intact and symmetrical. Psychiatry.  Appears to have some cognitive impairment  Data Reviewed: Prior data reviewed  Family Communication: Discussed with patient and wife at bedside.  Disposition: Status is: Inpatient Remains inpatient appropriate because: Workup for acute respiratory failure  Planned Discharge Destination: SNF  Time spent: 40 minutes  This record has been created using Conservation officer, historic buildings. Errors have been sought and corrected,but may not always be located. Such creation errors do not reflect on the standard of care.   Author: Arnetha Courser, MD 10/06/2022 4:06 PM  For on call review [email or web address].

## 2022-10-07 DIAGNOSIS — I2510 Atherosclerotic heart disease of native coronary artery without angina pectoris: Secondary | ICD-10-CM | POA: Diagnosis not present

## 2022-10-07 DIAGNOSIS — R0602 Shortness of breath: Secondary | ICD-10-CM | POA: Diagnosis not present

## 2022-10-07 DIAGNOSIS — R569 Unspecified convulsions: Secondary | ICD-10-CM | POA: Diagnosis not present

## 2022-10-07 DIAGNOSIS — I10 Essential (primary) hypertension: Secondary | ICD-10-CM | POA: Diagnosis not present

## 2022-10-07 LAB — GLUCOSE, CAPILLARY
Glucose-Capillary: 123 mg/dL — ABNORMAL HIGH (ref 70–99)
Glucose-Capillary: 157 mg/dL — ABNORMAL HIGH (ref 70–99)
Glucose-Capillary: 247 mg/dL — ABNORMAL HIGH (ref 70–99)
Glucose-Capillary: 297 mg/dL — ABNORMAL HIGH (ref 70–99)

## 2022-10-07 MED ORDER — HYDROCORTISONE 1 % EX CREA: 1.0000 | TOPICAL_CREAM | Freq: Three times a day (TID) | CUTANEOUS | Status: DC | PRN

## 2022-10-07 MED ORDER — PREDNISONE 50 MG PO TABS
50.0000 mg | ORAL_TABLET | Freq: Every day | ORAL | Status: DC
  Administered 2022-10-07 – 2022-10-10 (×4): 50 mg via ORAL
  Filled 2022-10-07 (×4): qty 1

## 2022-10-07 MED ORDER — MAGNESIUM HYDROXIDE 400 MG/5ML PO SUSP
30.0000 mL | Freq: Every day | ORAL | Status: DC
  Administered 2022-10-07 – 2022-10-08 (×2): 30 mL via ORAL
  Filled 2022-10-07 (×4): qty 30

## 2022-10-07 MED ORDER — LIDOCAINE 5 % EX PTCH
1.0000 | MEDICATED_PATCH | CUTANEOUS | Status: DC
  Administered 2022-10-07 – 2022-10-09 (×3): 1 via TRANSDERMAL
  Filled 2022-10-07 (×4): qty 1

## 2022-10-07 NOTE — Plan of Care (Signed)
  Problem: Respiratory: Goal: Ability to maintain adequate ventilation will improve Outcome: Progressing   Problem: Education: Goal: Knowledge of General Education information will improve Description: Including pain rating scale, medication(s)/side effects and non-pharmacologic comfort measures Outcome: Progressing   Problem: Health Behavior/Discharge Planning: Goal: Ability to manage health-related needs will improve Outcome: Progressing   Problem: Clinical Measurements: Goal: Respiratory complications will improve Outcome: Progressing   Problem: Activity: Goal: Risk for activity intolerance will decrease Outcome: Progressing   Problem: Elimination: Goal: Will not experience complications related to bowel motility Outcome: Progressing Goal: Will not experience complications related to urinary retention Outcome: Progressing   Problem: Pain Managment: Goal: General experience of comfort will improve Outcome: Progressing   Problem: Safety: Goal: Ability to remain free from injury will improve Outcome: Progressing   Problem: Skin Integrity: Goal: Risk for impaired skin integrity will decrease Outcome: Progressing   Problem: Clinical Measurements: Goal: Respiratory complications will improve Outcome: Progressing   Problem: Activity: Goal: Risk for activity intolerance will decrease Outcome: Progressing   Problem: Coping: Goal: Level of anxiety will decrease Outcome: Progressing   Problem: Pain Managment: Goal: General experience of comfort will improve Outcome: Progressing   Problem: Activity: Goal: Ability to tolerate increased activity will improve Outcome: Progressing   Problem: Respiratory: Goal: Ability to maintain a clear airway will improve Outcome: Progressing Goal: Levels of oxygenation will improve Outcome: Progressing Goal: Ability to maintain adequate ventilation will improve Outcome: Progressing

## 2022-10-07 NOTE — Plan of Care (Signed)
  Problem: Respiratory: Goal: Ability to maintain adequate ventilation will improve Outcome: Progressing   Problem: Fluid Volume: Goal: Ability to maintain a balanced intake and output will improve Outcome: Progressing   Problem: Education: Goal: Knowledge of General Education information will improve Description: Including pain rating scale, medication(s)/side effects and non-pharmacologic comfort measures Outcome: Progressing   Problem: Health Behavior/Discharge Planning: Goal: Ability to manage health-related needs will improve Outcome: Progressing

## 2022-10-07 NOTE — Progress Notes (Signed)
Progress Note   Patient: Alex Gomez [id number] DOB: 11-Oct-1945 DOA: 10/01/2022     6 DOS: the patient was seen and examined on 10/07/2022   Brief hospital course:  Alex Gomez is a 77 y.o. male with medical history significant for COPD/CAD, diabetes, HTN/ AVR/ seizures coming for SOB/DOE and orthopnea,Pt also has depression. And per reports pt has said he wants to die.Pt denies any SI.Wife at bedside. Spoke to  Wife at bedside gives history as patient has hearing difficulty states that patient has been weak over the past few days is no longer able to ambulate his coughing and short of breath.  Last echocardiogram noted a EF at 55%.  Patient does not have a history of heart failure however has had valve replacement and follows with cardiology at the [facility] clinic. Patient currently is alert awake oriented in no distress not using accessory muscles of respiration currently on nasal cannula. Initial vitals show patient's O2 sats at 88% on room air with his weight of 229.4 pounds, respirations of 22 and blood pressure 149/69.  EKG done today shows sinus rhythm at 69 with normal axis normal intervals no ST-T wave changes.  Metabolic panel shows glucose of 128 sodium 134 normal kidney function hepatic function is added on, BNP elevated at 104 normal troponin.  Respiratory panel  negative for flu RSV and COVID.  Urinalysis is clear and yellow with no leukocytes and nitrate negative. Patient had a CT angio negative for pulmonary embolism and viability basilar atelectasis without effusion no other focal abnormality and aortic atherosclerosis.  10/2: Developed mild hyponatremia, did receive an IV Lasix yesterday.  Clinically appears dry so giving some IV fluid.  Alcohol abuse can be contributory.  10/3: Vital stable, hyponatremia improving.  No bed offer yet.  Echocardiogram with normal EF, no regional wall motion abnormalities and grade 1 diastolic dysfunction.  Per wife patient uses CPAP to sleep at night  which was ordered.  Remained on 2 L of oxygen  10/4: Vital stable, slight worsening of lower extremity edema, ordered 1 dose of 20 mg of Lasix. Pending SNF.  10/5: Persistent lower extremity edema-giving 40 mg of Lasix x 1.  Left elbow pain and patient was concerned about getting another gouty attack, restarting home allopurinol and colchicine.  Uric acid at 6.7.  10/6: Worsening right elbow pain with no obvious swelling or erythema.  Added 3 days of prednisone and lidocaine patch.  Assessment and Plan:  Acute hypoxic respiratory failure Unclear etiology and may be secondary to possible acute COPD exacerbation No known history of CHF.  Noted to have bilateral lower extremity swelling, exertional shortness of breath Obtain 2D echocardiogram to assess LVEF.  Cardiology consult CT angiogram showed no evidence of pulmonary emboli. Bibasilar atelectasis without effusion. Able to wean back to room air. -Continue to monitor and supplemental oxygen as needed  Gout. Patient was complaining of left elbow pain, stating it seems like another gout attack. No erythema or edema noted on exam.  Uric acid 6.7 -Restarting home colchicine and allopurinol -Also added 3 days of prednisone and lidocaine patch due to his complaint of worsening pain.  TIA rule out CVA Had an episode of aphasia that resolved Initial CT scan of the head was negative for bleed MRI of the brain shows no acute intracranial abnormality. Mild chronic small vessel ischemic disease with small chronic left frontal, left parietal, and bilateral cerebellar infarcts. Continue aspirin and statins  Hyponatremia.  Improving with sodium at 132.  Patient did receive IV Lasix yesterday.  Likely multifactorial with diuretic and history of alcohol abuse.  Clinically appears dry.  Normal serum osmolality with mildly low urine osmolality -Monitor sodium  COPD with acute exacerbation Patient with complaints of worsening shortness of breath from  his baseline and also has a wet sounding cough Place patient on scheduled and as needed bronchodilator therapy Place patient antitussives Start inhaled steroid   Diabetes mellitus Maintain consistent carbohydrate diet Control sliding scale insulin  Thrombocytopenia No evidence of bleeding Most likely related to history of alcohol abuse  Seizure disorder Continue Keppra  Constipation.  Recent ED visit due to fecal impaction.  No bowel movement since Sunday. -Continue with bowel regimen  History of alcohol abuse Monitor closely for signs and symptoms of alcohol withdrawal Place patient on CIWA protocol and administer lorazepam for CIWA score of 8 or greater     Subjective: Patient was complaining of worsening right elbow pain.  Physical Exam: Vitals:   10/07/22 0400 10/07/22 0801 10/07/22 1000 10/07/22 1137  BP: 117/70 127/63  132/69  Pulse: 85 78  86  Resp: 20 16  16   Temp: 98.6 F (37 C) 98 F (36.7 C)  98 F (36.7 C)  TempSrc:  Oral  Oral  SpO2: 91% 91% 92% 90%  Weight:      Height:       General.  Frail elderly man, in no acute distress. Pulmonary.  Lungs clear bilaterally, normal respiratory effort. CV.  Regular rate and rhythm, no JVD, rub or murmur. Abdomen.  Soft, nontender, nondistended, BS positive. CNS.  Alert and oriented .  No focal neurologic deficit. Extremities.  No edema, no cyanosis, pulses intact and symmetrical.  No erythema or edema but restricted range of motion at right elbow due to pain Psychiatry.  Judgment and insight appears normal.   Data Reviewed: Prior data reviewed  Family Communication: Discussed with patient and wife at bedside.  Disposition: Status is: Inpatient Remains inpatient appropriate because: Workup for acute respiratory failure  Planned Discharge Destination: SNF  Time spent: 39 minutes  This record has been created using Conservation officer, historic buildings. Errors have been sought and corrected,but may not always  be located. Such creation errors do not reflect on the standard of care.   Author: Arnetha Courser, MD 10/07/2022 3:47 PM  For on call review [email or web address].

## 2022-10-08 DIAGNOSIS — R569 Unspecified convulsions: Secondary | ICD-10-CM | POA: Diagnosis not present

## 2022-10-08 DIAGNOSIS — I10 Essential (primary) hypertension: Secondary | ICD-10-CM | POA: Diagnosis not present

## 2022-10-08 DIAGNOSIS — R0602 Shortness of breath: Secondary | ICD-10-CM | POA: Diagnosis not present

## 2022-10-08 DIAGNOSIS — I2510 Atherosclerotic heart disease of native coronary artery without angina pectoris: Secondary | ICD-10-CM | POA: Diagnosis not present

## 2022-10-08 LAB — GLUCOSE, CAPILLARY
Glucose-Capillary: 136 mg/dL — ABNORMAL HIGH (ref 70–99)
Glucose-Capillary: 179 mg/dL — ABNORMAL HIGH (ref 70–99)
Glucose-Capillary: 296 mg/dL — ABNORMAL HIGH (ref 70–99)
Glucose-Capillary: 283 mg/dL — ABNORMAL HIGH (ref 70–99)

## 2022-10-08 LAB — CBC
HCT: 33.8 % — ABNORMAL LOW (ref 39.0–52.0)
Hemoglobin: 10.7 g/dL — ABNORMAL LOW (ref 13.0–17.0)
MCH: 24.9 pg — ABNORMAL LOW (ref 26.0–34.0)
MCHC: 31.7 g/dL (ref 30.0–36.0)
MCV: 78.8 fL — ABNORMAL LOW (ref 80.0–100.0)
Platelets: 94 10*3/uL — ABNORMAL LOW (ref 150–400)
RBC: 4.29 MIL/uL (ref 4.22–5.81)
RDW: 19.2 % — ABNORMAL HIGH (ref 11.5–15.5)
WBC: 9.7 10*3/uL (ref 4.0–10.5)
nRBC: 0 % (ref 0.0–0.2)

## 2022-10-08 LAB — BASIC METABOLIC PANEL
Anion gap: 7 (ref 5–15)
BUN: 28 mg/dL — ABNORMAL HIGH (ref 8–23)
CO2: 29 mmol/L (ref 22–32)
Calcium: 8 mg/dL — ABNORMAL LOW (ref 8.9–10.3)
Chloride: 95 mmol/L — ABNORMAL LOW (ref 98–111)
Creatinine, Ser: 0.82 mg/dL (ref 0.61–1.24)
GFR, Estimated: >60 mL/min (ref >60)
Glucose, Bld: 130 mg/dL — ABNORMAL HIGH (ref 70–99)
Potassium: 4.4 mmol/L (ref 3.5–5.1)
Sodium: 131 mmol/L — ABNORMAL LOW (ref 135–145)

## 2022-10-08 MED ORDER — TORSEMIDE 20 MG PO TABS
20.0000 mg | ORAL_TABLET | Freq: Every day | ORAL | Status: DC
  Administered 2022-10-08 – 2022-10-10 (×3): 20 mg via ORAL
  Filled 2022-10-08 (×3): qty 1

## 2022-10-08 NOTE — Progress Notes (Signed)
Progress Note   Patient: Alex Gomez ZOX:[id number] DOB: 1945/02/21 DOA: 10/01/2022     7 DOS: the patient was seen and examined on 10/08/2022   Brief hospital course:  Alex Gomez is a 77 y.o. male with medical history significant for COPD/CAD, diabetes, HTN/ AVR/ seizures coming for SOB/DOE and orthopnea,Pt also has depression. And per reports pt has said he wants to die.Pt denies any SI.Wife at bedside. Spoke to  Wife at bedside gives history as patient has hearing difficulty states that patient has been weak over the past few days is no longer able to ambulate his coughing and short of breath.  Last echocardiogram noted a EF at 55%.  Patient does not have a history of heart failure however has had valve replacement and follows with cardiology at the [facility] clinic. Patient currently is alert awake oriented in no distress not using accessory muscles of respiration currently on nasal cannula. Initial vitals show patient's O2 sats at 88% on room air with his weight of 229.4 pounds, respirations of 22 and blood pressure 149/69.  EKG done today shows sinus rhythm at 69 with normal axis normal intervals no ST-T wave changes.  Metabolic panel shows glucose of 128 sodium 134 normal kidney function hepatic function is added on, BNP elevated at 104 normal troponin.  Respiratory panel  negative for flu RSV and COVID.  Urinalysis is clear and yellow with no leukocytes and nitrate negative. Patient had a CT angio negative for pulmonary embolism and viability basilar atelectasis without effusion no other focal abnormality and aortic atherosclerosis.  10/2: Developed mild hyponatremia, did receive an IV Lasix yesterday.  Clinically appears dry so giving some IV fluid.  Alcohol abuse can be contributory.  10/3: Vital stable, hyponatremia improving.  No bed offer yet.  Echocardiogram with normal EF, no regional wall motion abnormalities and grade 1 diastolic dysfunction.  Per wife patient uses CPAP to sleep at night  which was ordered.  Remained on 2 L of oxygen  10/4: Vital stable, slight worsening of lower extremity edema, ordered 1 dose of 20 mg of Lasix. Pending SNF.  10/5: Persistent lower extremity edema-giving 40 mg of Lasix x 1.  Left elbow pain and patient was concerned about getting another gouty attack, restarting home allopurinol and colchicine.  Uric acid at 6.7.  10/6: Worsening right elbow pain with no obvious swelling or erythema.  Added 3 days of prednisone and lidocaine patch.  10/7: Continue to have right elbow pain, no obvious erythema or edema, unlikely gouty flare, may be arthritis flare.  Still awaiting SNF placement.  Starting on torsemide 20 mg daily due to persistent lower extremity edema.  Assessment and Plan:  Acute hypoxic respiratory failure Unclear etiology and may be secondary to possible acute COPD exacerbation No known history of CHF.  Noted to have bilateral lower extremity swelling, exertional shortness of breath Obtain 2D echocardiogram to assess LVEF.  Cardiology consult CT angiogram showed no evidence of pulmonary emboli. Bibasilar atelectasis without effusion. Able to wean back to room air. -Continue to monitor and supplemental oxygen as needed  Gout. Patient was complaining of left elbow pain, stating it seems like another gout attack. No erythema or edema noted on exam.  Uric acid 6.7 -Restarting home colchicine and allopurinol -Also added 3 days of prednisone and lidocaine patch due to his complaint of worsening pain.  TIA rule out CVA Had an episode of aphasia that resolved Initial CT scan of the head was negative for bleed MRI  of the brain shows no acute intracranial abnormality. Mild chronic small vessel ischemic disease with small chronic left frontal, left parietal, and bilateral cerebellar infarcts. Continue aspirin and statins  Hyponatremia.  Improving with sodium at 131.    Likely multifactorial with diuretic and history of alcohol abuse.   Clinically appears dry.  Normal serum osmolality with mildly low urine osmolality -Monitor sodium  COPD with acute exacerbation Patient with complaints of worsening shortness of breath from his baseline and also has a wet sounding cough Place patient on scheduled and as needed bronchodilator therapy Place patient antitussives Start inhaled steroid   Diabetes mellitus Maintain consistent carbohydrate diet Control sliding scale insulin  Thrombocytopenia No evidence of bleeding Most likely related to history of alcohol abuse  Seizure disorder Continue Keppra  Constipation.  Recent ED visit due to fecal impaction.  No bowel movement since Sunday. -Continue with bowel regimen  History of alcohol abuse Monitor closely for signs and symptoms of alcohol withdrawal Place patient on CIWA protocol and administer lorazepam for CIWA score of 8 or greater     Subjective: Patient was sitting in chair comfortably when seen today.  Continue to have some right elbow pain.  Physical Exam: Vitals:   10/08/22 0100 10/08/22 0435 10/08/22 0752 10/08/22 1124  BP: (!) 146/78 (!) 145/74 117/71 (!) 145/74  Pulse:  70 62 72  Resp: 19 20    Temp: 98.3 F (36.8 C) 98.5 F (36.9 C) 97.8 F (36.6 C) 98.3 F (36.8 C)  TempSrc: Oral Oral    SpO2: 91% 92% 94% 96%  Weight:      Height:       General.  Frail elderly man, in no acute distress. Pulmonary.  Lungs clear bilaterally, normal respiratory effort. CV.  Regular rate and rhythm, no JVD, rub or murmur. Abdomen.  Soft, nontender, nondistended, BS positive. CNS.  Alert and oriented .  No focal neurologic deficit. Extremities.  No edema, no cyanosis, pulses intact and symmetrical. Psychiatry.  Judgment and insight appears normal.    Data Reviewed: Prior data reviewed  Family Communication: Discussed with patient and wife at bedside.  Disposition: Status is: Inpatient Remains inpatient appropriate because: Workup for acute respiratory  failure  Planned Discharge Destination: SNF  Time spent: 40 minutes  This record has been created using Conservation officer, historic buildings. Errors have been sought and corrected,but may not always be located. Such creation errors do not reflect on the standard of care.   Author: Arnetha Courser, MD 10/08/2022 2:40 PM  For on call review [email or web address].

## 2022-10-08 NOTE — Progress Notes (Signed)
Physical Therapy Treatment Patient Details Name: Alex Gomez MRN: [id number] DOB: Sep 30, 1945 Today's Date: 10/08/2022   History of Present Illness Alex Gomez is a 77 y.o. male with medical history significant for COPD/CAD, diabetes, HTN/ AVR/ seizures coming for SOB/DOE and orthopnea.  Presentation concerning for delirium due to polypharmacy.    PT Comments  Pt was pleasant and motivated to participate during the session and put forth good effort throughout. Pt able to stand without use of RW to stand fully upright, though needed multiple attempts and VC's for hand placement with first STS performed. Pt able to perform 2x 100 feet bouts with RW and CGA, therapeutic rest break between, HR and SpO2 remained WNL on 2L throughout session. During amb pt having detectable changes in gait speed/step length, often slowing down or speeding up at random. No imbalance noted, but does rely heavily on RW.  VC's provided for pt to keep RW on ground when making sharp turns but with poor carryover this session. Pt will benefit from continued PT services upon discharge to safely address deficits listed in patient problem list for decreased caregiver assistance and eventual return to PLOF.    If plan is discharge home, recommend the following: A little help with walking and/or transfers;A lot of help with bathing/dressing/bathroom;Assist for transportation;Help with stairs or ramp for entrance   Can travel by private vehicle     Yes  Equipment Recommendations  Other (comment) (Defer to next level of care)    Recommendations for Other Services       Precautions / Restrictions Precautions Precautions: Fall Restrictions Weight Bearing Restrictions: No     Mobility  Bed Mobility               General bed mobility comments: pt seated in chair at start/end of session    Transfers Overall transfer level: Needs assistance Equipment used: None Transfers: Sit to/from Stand Sit to Stand: Contact  guard assist           General transfer comment: slow, needs cues for hand placement. For initial STS pt required multiple attempts to stand completely    Ambulation/Gait Ambulation/Gait assistance: Contact guard assist Gait Distance (Feet): 100 Feet x2 Assistive device: Rolling walker (2 wheels) Gait Pattern/deviations: Step-through pattern, Narrow base of support, Trunk flexed, Decreased step length - right, Decreased step length - left, Decreased stride length Gait velocity: slightly decreased     General Gait Details: With theraputic rest break between each bout. Needing 2L O2 throughout session, noted labored breathing at the end of each bout. Notable inconsistency in step cadence, often slowing down or speeding up without warning. VC's provided for pt to keep RW on ground when making sharp turns in the hallway. Remains overall steady but poor safety awareness   Stairs             Wheelchair Mobility     Tilt Bed    Modified Rankin (Stroke Patients Only)       Balance Overall balance assessment: Needs assistance Sitting-balance support: No upper extremity supported, Feet supported Sitting balance-Leahy Scale: Good     Standing balance support: Single extremity supported, During functional activity Standing balance-Leahy Scale: Fair Standing balance comment: sustain static standing with RW                            Cognition Arousal: Alert Behavior During Therapy: WFL for tasks assessed/performed Overall Cognitive Status: Within Functional Limits for  tasks assessed                                          Exercises      General Comments        Pertinent Vitals/Pain Pain Assessment Pain Assessment: No/denies pain    Home Living                          Prior Function            PT Goals (current goals can now be found in the care plan section) Progress towards PT goals: Progressing toward goals     Frequency    Min 1X/week      PT Plan      Co-evaluation              AM-PAC PT "6 Clicks" Mobility   Outcome Measure  Help needed turning from your back to your side while in a flat bed without using bedrails?: A Little Help needed moving from lying on your back to sitting on the side of a flat bed without using bedrails?: A Little Help needed moving to and from a bed to a chair (including a wheelchair)?: A Little Help needed standing up from a chair using your arms (e.g., wheelchair or bedside chair)?: A Little Help needed to walk in hospital room?: A Little Help needed climbing 3-5 steps with a railing? : A Lot 6 Click Score: 17    End of Session Equipment Utilized During Treatment: Gait belt Activity Tolerance: Patient tolerated treatment well Patient left: with chair alarm set;with call bell/phone within reach;in chair Nurse Communication: Mobility status PT Visit Diagnosis: Unsteadiness on feet (R26.81);Repeated falls (R29.6);Muscle weakness (generalized) (M62.81);History of falling (Z91.81)     Time: 4098-1191 PT Time Calculation (min) (ACUTE ONLY): 24 min  Charges:                            Cecile Sheerer, SPT 10/08/22, 5:24 PM

## 2022-10-08 NOTE — Progress Notes (Signed)
Occupational Therapy Treatment Patient Details Name: Alex Gomez MRN: [id number] DOB: 1945/12/18 Today's Date: 10/08/2022   History of present illness Alex Gomez is a 77 y.o. male with medical history significant for COPD/CAD, diabetes, HTN/ AVR/ seizures coming for SOB/DOE and orthopnea.  Presentation concerning for delirium due to polypharmacy.   OT comments  Alex Gomez was seen for OT treatment on this date. Upon arrival to room pt seated in chair, agreeable to tx. Pt requires CGA for toilet t/f and standing toileting, requires single UE support for urinal use. SETUP don/doff B socks in sitting. Pt making good progress toward goals, will continue to follow POC. Discharge recommendation remains appropriate.        If plan is discharge home, recommend the following:  A little help with walking and/or transfers;A little help with bathing/dressing/bathroom;Help with stairs or ramp for entrance   Equipment Recommendations  BSC/3in1    Recommendations for Other Services      Precautions / Restrictions Precautions Precautions: Fall Restrictions Weight Bearing Restrictions: No       Mobility Bed Mobility Overal bed mobility: Needs Assistance Bed Mobility: Sit to Supine       Sit to supine: Supervision        Transfers Overall transfer level: Needs assistance Equipment used: None Transfers: Sit to/from Stand Sit to Stand: Contact guard assist           General transfer comment: x2 stands from chair     Balance Overall balance assessment: Needs assistance Sitting-balance support: No upper extremity supported, Feet supported Sitting balance-Leahy Scale: Good     Standing balance support: Single extremity supported, During functional activity Standing balance-Leahy Scale: Fair                             ADL either performed or assessed with clinical judgement   ADL Overall ADL's : Needs assistance/impaired                                        General ADL Comments: CGA for toilet t/f and standing toileting. SETUP don/doff B socks in sitting.      Cognition Arousal: Alert Behavior During Therapy: WFL for tasks assessed/performed Overall Cognitive Status: Within Functional Limits for tasks assessed                                                     Pertinent Vitals/ Pain       Pain Assessment Pain Assessment: No/denies pain   Frequency  Min 1X/week        Progress Toward Goals  OT Goals(current goals can now be found in the care plan section)  Progress towards OT goals: Progressing toward goals  Acute Rehab OT Goals Patient Stated Goal: to get stronger OT Goal Formulation: With patient/family Time For Goal Achievement: 10/16/22 Potential to Achieve Goals: Good ADL Goals Pt Will Perform Grooming: with modified independence;standing Pt Will Perform Lower Body Dressing: sit to/from stand;with min assist;with caregiver independent in assisting Pt Will Transfer to Toilet: with modified independence;ambulating;regular height toilet  Plan      Co-evaluation  AM-PAC OT "6 Clicks" Daily Activity     Outcome Measure   Help from another person eating meals?: None Help from another person taking care of personal grooming?: A Little Help from another person toileting, which includes using toliet, bedpan, or urinal?: A Lot Help from another person bathing (including washing, rinsing, drying)?: A Lot Help from another person to put on and taking off regular upper body clothing?: A Little Help from another person to put on and taking off regular lower body clothing?: A Lot 6 Click Score: 16    End of Session    OT Visit Diagnosis: Other abnormalities of gait and mobility (R26.89);Muscle weakness (generalized) (M62.81)   Activity Tolerance Patient tolerated treatment well   Patient Left in bed;with call bell/phone within reach;with bed alarm set;with  family/visitor present   Nurse Communication          Time: 1610-9604 OT Time Calculation (min): 16 min  Charges: OT General Charges $OT Visit: 1 Visit OT Treatments $Self Care/Home Management : 8-22 mins  Kathie Dike, M.S. OTR/L  10/08/22, 3:29 PM  ascom [phone]

## 2022-10-08 NOTE — TOC Progression Note (Signed)
Transition of Care [facility]) - Progression Note    Patient Details  Name: Alex Gomez MRN: [id number] Date of Birth: [birth year]  [facility]) CM/SW Contact  Truddie Hidden, RN Phone Number: 10/08/2022, 3:15 PM  Clinical Narrative:    Spoke with Gena from [facility]. Facility can accept patient tomorrow after noon. MD notified.           Expected Discharge Plan and Services                                               Social Determinants of Health (SDOH) Interventions SDOH Screenings   Food Insecurity: No Food Insecurity (10/03/2022)  Housing: Low Risk  (10/03/2022)  Transportation Needs: No Transportation Needs (10/03/2022)  Utilities: Not At Risk (10/03/2022)  Depression (PHQ2-9): Medium Risk (08/20/2018)  Tobacco Use: Medium Risk (10/01/2022)    Readmission Risk Interventions    10/02/2022    9:50 AM  Readmission Risk Prevention Plan  Transportation Screening Complete  PCP or Specialist Appt within 3-5 Days Complete  HRI or Home Care Consult Complete  Social Work Consult for Recovery Care Planning/Counseling Complete  Palliative Care Screening Not Applicable  Medication Review Oceanographer) Not Complete  Med Review Comments Patient will review medication upon discharge.

## 2022-10-08 NOTE — TOC Progression Note (Addendum)
Transition of Care [facility]) - Progression Note    Patient Details  Name: JERRELLE MICHELSEN MRN: [id number] Date of Birth: 1945-02-19  Transition of Care [facility]) CM/SW Contact  Truddie Hidden, RN Phone Number: 10/08/2022, 10:31 AM  Clinical Narrative:    Sherron Monday with Tiffany from [facility] regarding bed offer. Patient referral being reviewed.   12:35pm Spoke with patient and his wife at bedside regarding bed offers for [facility] and [facility]. Patient wife is adamant they do not want [facility].  They inquired about [facility] and [facility]. [facility] M advised [facility] did not have bed availability and no bed offer was given by [facility].  They are agreeable to  [facility]. MD notified.         Expected Discharge Plan and Services                                               Social Determinants of Health (SDOH) Interventions SDOH Screenings   Food Insecurity: No Food Insecurity (10/03/2022)  Housing: Low Risk  (10/03/2022)  Transportation Needs: No Transportation Needs (10/03/2022)  Utilities: Not At Risk (10/03/2022)  Depression (PHQ2-9): Medium Risk (08/20/2018)  Tobacco Use: Medium Risk (10/01/2022)    Readmission Risk Interventions    10/02/2022    9:50 AM  Readmission Risk Prevention Plan  Transportation Screening Complete  PCP or Specialist Appt within 3-5 Days Complete  HRI or Home Care Consult Complete  Social Work Consult for Recovery Care Planning/Counseling Complete  Palliative Care Screening Not Applicable  Medication Review Oceanographer) Not Complete  Med Review Comments Patient will review medication upon discharge.

## 2022-10-09 DIAGNOSIS — R569 Unspecified convulsions: Secondary | ICD-10-CM | POA: Diagnosis not present

## 2022-10-09 DIAGNOSIS — I10 Essential (primary) hypertension: Secondary | ICD-10-CM | POA: Diagnosis not present

## 2022-10-09 DIAGNOSIS — I2510 Atherosclerotic heart disease of native coronary artery without angina pectoris: Secondary | ICD-10-CM | POA: Diagnosis not present

## 2022-10-09 DIAGNOSIS — R0602 Shortness of breath: Secondary | ICD-10-CM | POA: Diagnosis not present

## 2022-10-09 LAB — GLUCOSE, CAPILLARY
Glucose-Capillary: 133 mg/dL — ABNORMAL HIGH (ref 70–99)
Glucose-Capillary: 134 mg/dL — ABNORMAL HIGH (ref 70–99)
Glucose-Capillary: 219 mg/dL — ABNORMAL HIGH (ref 70–99)
Glucose-Capillary: 236 mg/dL — ABNORMAL HIGH (ref 70–99)

## 2022-10-09 NOTE — Progress Notes (Signed)
Physical Therapy Treatment Patient Details Name: Alex Gomez MRN: [id number] DOB: 04-02-1945 Today's Date: 10/09/2022   History of Present Illness Alex Gomez is a 77 y.o. male with medical history significant for COPD/CAD, diabetes, HTN/ AVR/ seizures coming for SOB/DOE and orthopnea.  Presentation concerning for delirium due to polypharmacy.    PT Comments  Pt was pleasant and motivated to participate during the session and put forth good effort throughout. Pt found on RA, with SpO2 94%; opted to perform mobility on RA, with SpO2 remaining mid 90's throughout session. Pt continues to be CGA for STS transfers without use of AD. He was able to tolerate 100 feet amb bout with RW. He continues to display variability in step cadence; provided cues for a steady pace but pt unable to make changes. Pt reports not wanting to walk another lap today secondary to fatigue, no reported SOB or dizziness. Pt will benefit from continued PT services upon discharge to safely address deficits listed in patient problem list for decreased caregiver assistance and eventual return to PLOF.    If plan is discharge home, recommend the following: A little help with walking and/or transfers;A lot of help with bathing/dressing/bathroom;Assist for transportation;Help with stairs or ramp for entrance   Can travel by private vehicle     Yes  Equipment Recommendations  Other (comment) (Defer to next level of care)    Recommendations for Other Services       Precautions / Restrictions Precautions Precautions: Fall Restrictions Weight Bearing Restrictions: No     Mobility  Bed Mobility               General bed mobility comments: pt seated in chair at start/end of session    Transfers Overall transfer level: Needs assistance Equipment used: None Transfers: Sit to/from Stand Sit to Stand: Contact guard assist           General transfer comment: Slow, able to stand up with good hand placement     Ambulation/Gait Ambulation/Gait assistance: Contact guard assist Gait Distance (Feet): 100 Feet Assistive device: Rolling walker (2 wheels) Gait Pattern/deviations: Step-through pattern, Narrow base of support, Trunk flexed, Decreased step length - right, Decreased step length - left, Decreased stride length Gait velocity: slightly decreased     General Gait Details: Pt continuing with variable cadence, attempted to cue for steady pace but pt unable. Overall steady, but needs continuing cues for RW managment to ensure safe use. Additional cues provided for upright posture.   Stairs             Wheelchair Mobility     Tilt Bed    Modified Rankin (Stroke Patients Only)       Balance Overall balance assessment: Needs assistance Sitting-balance support: No upper extremity supported, Feet supported Sitting balance-Leahy Scale: Good     Standing balance support: Single extremity supported, During functional activity Standing balance-Leahy Scale: Fair Standing balance comment: sustain static standing with RW                            Cognition Arousal: Alert Behavior During Therapy: WFL for tasks assessed/performed Overall Cognitive Status: Within Functional Limits for tasks assessed                                          Exercises  General Comments        Pertinent Vitals/Pain Pain Assessment Pain Assessment: No/denies pain    Home Living                          Prior Function            PT Goals (current goals can now be found in the care plan section) Progress towards PT goals: Progressing toward goals    Frequency    Min 1X/week      PT Plan      Co-evaluation              AM-PAC PT "6 Clicks" Mobility   Outcome Measure  Help needed turning from your back to your side while in a flat bed without using bedrails?: A Little Help needed moving from lying on your back to sitting on the  side of a flat bed without using bedrails?: A Little Help needed moving to and from a bed to a chair (including a wheelchair)?: A Little Help needed standing up from a chair using your arms (e.g., wheelchair or bedside chair)?: A Little Help needed to walk in hospital room?: A Little Help needed climbing 3-5 steps with a railing? : A Lot 6 Click Score: 17    End of Session Equipment Utilized During Treatment: Gait belt Activity Tolerance: Patient tolerated treatment well Patient left: with chair alarm set;with call bell/phone within reach;in chair;with family/visitor present Nurse Communication: Mobility status PT Visit Diagnosis: Unsteadiness on feet (R26.81);Repeated falls (R29.6);Muscle weakness (generalized) (M62.81);History of falling (Z91.81)     Time: 3557-3220 PT Time Calculation (min) (ACUTE ONLY): 15 min  Charges:                            Cecile Sheerer, SPT 10/09/22, 3:52 PM

## 2022-10-09 NOTE — Progress Notes (Signed)
Occupational Therapy Treatment Patient Details Name: COLBERT BUREK MRN: [id number] DOB: 1945/12/30 Today's Date: 10/09/2022   History of present illness MUSSIE ADGER is a 77 y.o. male with medical history significant for COPD/CAD, diabetes, HTN/ AVR/ seizures coming for SOB/DOE and orthopnea.  Presentation concerning for delirium due to polypharmacy.   OT comments  Pt. sitting up in the recliner chair upon arrival with a full meal tray sitting on the bedside table. Pt. reported that he is not eating lunch, and is waiting from his wife to return with a different lunch for him.  Pt. Is independent donning Nonskid footie socks. Pt. required supervision sit to stand with RW. Pt. standing balance was challenged with reaching in multiple planes. Pt. presents with impulsivity, and some leaning posteriorly. Pt. is able to right self with cues. Pt. reports "getting winded" during standing tasks. Pt. education was provided about pursed lip breathing techniques. Pt. Was able to demonstrate PLB techniques. SpO2 95-97%, HR 83, BP 143/79.  Pt. continues to benefit from OT services for ADL training, A/E training, and pt. Education about  PLB techniques, home modification, and DME.       If plan is discharge home, recommend the following:  A little help with walking and/or transfers;A little help with bathing/dressing/bathroom;Help with stairs or ramp for entrance   Equipment Recommendations  BSC/3in1    Recommendations for Other Services      Precautions / Restrictions Precautions Precautions: Fall Restrictions Weight Bearing Restrictions: No       Mobility Bed Mobility               General bed mobility comments: Pt. up in recliner chair upon arrival.    Transfers Overall transfer level: Needs assistance Equipment used: Rolling walker (2 wheels) Transfers: Sit to/from Stand Sit to Stand: Supervision                 Balance                                            ADL either performed or assessed with clinical judgement   ADL                                         General ADL Comments: CGA functional transfers, Indepndent  donning nonskid footie socks    Extremity/Trunk Assessment   [facility]            Vision       Perception     Praxis      Cognition Arousal: Alert  Behavior During Therapy: WFL for tasks assessed/performed Overall Cognitive Status: Within Functional Limits for tasks assessed                                          Exercises      Shoulder Instructions       General Comments      Pertinent Vitals/ Pain         No denies Pain  Home Living  Prior Functioning/Environment              Frequency  Min 1X/week        Progress Toward Goals  OT Goals(current goals can now be found in the care plan section)  Progress towards OT goals: Progressing toward goals  Acute Rehab OT Goals Patient Stated Goal: To get stronger OT Goal Formulation: With patient/family Time For Goal Achievement: 10/16/22 Potential to Achieve Goals: Good  Plan      Co-evaluation      Reason for Co-Treatment: Necessary to address cognition/behavior during functional activity;To address functional/ADL transfers PT goals addressed during session: Mobility/safety with mobility;Balance;Proper use of DME;Strengthening/ROM        AM-PAC OT "6 Clicks" Daily Activity     Outcome Measure   Help from another person eating meals?: None Help from another person taking care of personal grooming?: A Little Help from another person toileting, which includes using toliet, bedpan, or urinal?: A Lot Help from another person bathing (including washing, rinsing, drying)?: A Lot Help from another person to put on and taking off regular upper body clothing?: A Little Help from another person to put on and taking off regular lower body  clothing?: A Little 6 Click Score: 17    End of Session Equipment Utilized During Treatment: Rolling walker (2 wheels)  OT Visit Diagnosis: Other abnormalities of gait and mobility (R26.89);Muscle weakness (generalized) (M62.81)   Activity Tolerance Patient tolerated treatment well   Patient Left in bed;with call bell/phone within reach;with bed alarm set;with family/visitor present   Nurse Communication Mobility status        Time: 1610-9604 OT Time Calculation (min): 22 min  Charges: OT General Charges $OT Visit: 1 Visit OT Treatments $Self Care/Home Management : 8-22 mins  Olegario Messier, MS, OTR/L   Olegario Messier 10/09/2022, 3:21 PM

## 2022-10-09 NOTE — TOC Progression Note (Addendum)
Transition of Care [facility]) - Progression Note    Patient Details  Name: Alex Gomez MRN: [id number] Date of Birth: [birth year]  Transition of [facility]) CM/SW Contact  Margarito Liner, LCSW Phone Number: 10/09/2022, 11:46 AM  Clinical Narrative:  [facility] is unable to accept patient until tomorrow. They also still have to verify his insurance. Patient and wife are aware. Wife wants to see if a bed will be available at [facility]. CSW left admissions coordinator a voicemail. Patient is interested in [facility] services once he returns home. He and wife understand that the hospital cannot set this up but they are agreeable to a referral to either [facility] or [facility] for assistance. CSW sent secure email to financial counselor to see if she can pull up patient's Medicare and BCBS and add them to the facesheet. Wife looked for his insurance cards but did not have them.   1:03 pm: Artist confirmed they verified his Medicare and BCBS supplemental insurance. CSW sent information in a secure email to the [facility] admissions assistant. She confirmed they will have a private room tomorrow (room 806) but timing for admission will not be known until 9:30 or 10:00. They will have to wait on the current patient in that room to discharge so they can clean it. CSW made referral to [facility]. Liaison will likely reach out to wife today. CSW updated patient and wife.  Expected Discharge Plan and Services                                               Social Determinants of Health (SDOH) Interventions SDOH Screenings   Food Insecurity: No Food Insecurity (10/03/2022)  Housing: Low Risk  (10/03/2022)  Transportation Needs: No Transportation Needs (10/03/2022)  Utilities: Not At Risk (10/03/2022)  Depression (PHQ2-9): Medium Risk (08/20/2018)  Tobacco Use: Medium Risk (10/01/2022)    Readmission Risk Interventions    10/02/2022    9:50 AM  Readmission  Risk Prevention Plan  Transportation Screening Complete  PCP or Specialist Appt within 3-5 Days Complete  HRI or Home Care Consult Complete  Social Work Consult for Recovery Care Planning/Counseling Complete  Palliative Care Screening Not Applicable  Medication Review Oceanographer) Not Complete  Med Review Comments Patient will review medication upon discharge.

## 2022-10-09 NOTE — Plan of Care (Signed)

## 2022-10-09 NOTE — Progress Notes (Signed)
Progress Note   Patient: Alex Gomez YQM:[id number] DOB: 02/10/1945 DOA: 10/01/2022     8 DOS: the patient was seen and examined on 10/09/2022   Brief hospital course:  Alex Gomez is a 77 y.o. male with medical history significant for COPD/CAD, diabetes, HTN/ AVR/ seizures coming for SOB/DOE and orthopnea,Pt also has depression. And per reports pt has said he wants to die.Pt denies any SI.Wife at bedside. Spoke to  Wife at bedside gives history as patient has hearing difficulty states that patient has been weak over the past few days is no longer able to ambulate his coughing and short of breath.  Last echocardiogram noted a EF at 55%.  Patient does not have a history of heart failure however has had valve replacement and follows with cardiology at the [facility]. Patient currently is alert awake oriented in no distress not using accessory muscles of respiration currently on nasal cannula. Initial vitals show patient's O2 sats at 88% on room air with his weight of 229.4 pounds, respirations of 22 and blood pressure 149/69.  EKG done today shows sinus rhythm at 69 with normal axis normal intervals no ST-T wave changes.  Metabolic panel shows glucose of 128 sodium 134 normal kidney function hepatic function is added on, BNP elevated at 104 normal troponin.  Respiratory panel  negative for flu RSV and COVID.  Urinalysis is clear and yellow with no leukocytes and nitrate negative. Patient had a CT angio negative for pulmonary embolism and viability basilar atelectasis without effusion no other focal abnormality and aortic atherosclerosis.  10/2: Developed mild hyponatremia, did receive an IV Lasix yesterday.  Clinically appears dry so giving some IV fluid.  Alcohol abuse can be contributory.  10/3: Vital stable, hyponatremia improving.  No bed offer yet.  Echocardiogram with normal EF, no regional wall motion abnormalities and grade 1 diastolic dysfunction.  Per wife patient uses CPAP to sleep at night  which was ordered.  Remained on 2 L of oxygen  10/4: Vital stable, slight worsening of lower extremity edema, ordered 1 dose of 20 mg of Lasix. Pending SNF.  10/5: Persistent lower extremity edema-giving 40 mg of Lasix x 1.  Left elbow pain and patient was concerned about getting another gouty attack, restarting home allopurinol and colchicine.  Uric acid at 6.7.  10/6: Worsening right elbow pain with no obvious swelling or erythema.  Added 3 days of prednisone and lidocaine patch.  10/7: Continue to have right elbow pain, no obvious erythema or edema, unlikely gouty flare, may be arthritis flare.  Still awaiting SNF placement.  Starting on torsemide 20 mg daily due to persistent lower extremity edema.  10/8: Remains stable with pending insurance, hopefully can go tomorrow morning.  Assessment and Plan:  Acute hypoxic respiratory failure Unclear etiology and may be secondary to possible acute COPD exacerbation No known history of CHF.  Noted to have bilateral lower extremity swelling, exertional shortness of breath Obtain 2D echocardiogram to assess LVEF.  Cardiology consult CT angiogram showed no evidence of pulmonary emboli. Bibasilar atelectasis without effusion. Able to wean back to room air. -Continue to monitor and supplemental oxygen as needed  Gout. Patient was complaining of left elbow pain, stating it seems like another gout attack. No erythema or edema noted on exam.  Uric acid 6.7 -Restarting home colchicine and allopurinol -Also added 3 days of prednisone and lidocaine patch due to his complaint of worsening pain.  TIA rule out CVA Had an episode of aphasia that  resolved Initial CT scan of the head was negative for bleed MRI of the brain shows no acute intracranial abnormality. Mild chronic small vessel ischemic disease with small chronic left frontal, left parietal, and bilateral cerebellar infarcts. Continue aspirin and statins  Hyponatremia.  Improving with sodium at  131.    Likely multifactorial with diuretic and history of alcohol abuse.  Clinically appears dry.  Normal serum osmolality with mildly low urine osmolality -Monitor sodium  COPD with acute exacerbation Patient with complaints of worsening shortness of breath from his baseline and also has a wet sounding cough Place patient on scheduled and as needed bronchodilator therapy Place patient antitussives Start inhaled steroid   Diabetes mellitus Maintain consistent carbohydrate diet Control sliding scale insulin  Thrombocytopenia No evidence of bleeding Most likely related to history of alcohol abuse  Seizure disorder Continue Keppra  Constipation.  Recent ED visit due to fecal impaction.  No bowel movement since Sunday. -Continue with bowel regimen  History of alcohol abuse Monitor closely for signs and symptoms of alcohol withdrawal Place patient on CIWA protocol and administer lorazepam for CIWA score of 8 or greater     Subjective: Patient was sitting comfortably in chair when seen today.  No new concern.  He wants to get out of the hospital.  Physical Exam: Vitals:   10/08/22 2043 10/08/22 2248 10/09/22 0754 10/09/22 1205  BP: (!) 147/78 (!) 147/63 (!) 151/55 (!) 143/79  Pulse: 70 78 70 73  Resp: 18 18    Temp:  98.5 F (36.9 C) 97.7 F (36.5 C) (!) 97.5 F (36.4 C)  TempSrc:      SpO2: 95% 95% 92% 95%  Weight:      Height:       General.  Frail elderly man, in no acute distress. Pulmonary.  Lungs clear bilaterally, normal respiratory effort. CV.  Regular rate and rhythm, no JVD, rub or murmur. Abdomen.  Soft, nontender, nondistended, BS positive. CNS.  Alert and oriented .  No focal neurologic deficit. Extremities.  No edema, no cyanosis, pulses intact and symmetrical. Psychiatry.  Judgment and insight appears normal.   Data Reviewed: Prior data reviewed  Family Communication: Discussed with patient and wife at bedside.  Disposition: Status is:  Inpatient Remains inpatient appropriate because: Workup for acute respiratory failure  Planned Discharge Destination: SNF  Time spent: 39 minutes  This record has been created using Conservation officer, historic buildings. Errors have been sought and corrected,but may not always be located. Such creation errors do not reflect on the standard of care.   Author: Arnetha Courser, MD 10/09/2022 3:41 PM  For on call review [email or web address].

## 2022-10-10 DIAGNOSIS — R0602 Shortness of breath: Secondary | ICD-10-CM | POA: Diagnosis not present

## 2022-10-10 LAB — GLUCOSE, CAPILLARY
Glucose-Capillary: 115 mg/dL — ABNORMAL HIGH (ref 70–99)
Glucose-Capillary: 196 mg/dL — ABNORMAL HIGH (ref 70–99)

## 2022-10-10 MED ORDER — MAGNESIUM HYDROXIDE 400 MG/5ML PO SUSP: 30.0000 mL | Freq: Every day | ORAL | Status: DC

## 2022-10-10 MED ORDER — BISACODYL 10 MG RE SUPP: 10.0000 mg | Freq: Every day | RECTAL | Status: DC | PRN

## 2022-10-10 MED ORDER — TORSEMIDE 20 MG PO TABS: 20.0000 mg | ORAL_TABLET | Freq: Every day | ORAL | Status: DC

## 2022-10-10 MED ORDER — POLYETHYLENE GLYCOL 3350 17 G PO PACK: 17.0000 g | PACK | Freq: Two times a day (BID) | ORAL | Status: DC

## 2022-10-10 MED ORDER — ONDANSETRON HCL 4 MG PO TABS: 4.0000 mg | ORAL_TABLET | Freq: Four times a day (QID) | ORAL | Status: DC | PRN

## 2022-10-10 MED ORDER — ENSURE MAX PROTEIN PO LIQD: 11.0000 [oz_av] | Freq: Every day | ORAL | Status: DC

## 2022-10-10 MED ORDER — ACETAMINOPHEN 325 MG PO TABS: 650.0000 mg | ORAL_TABLET | Freq: Four times a day (QID) | ORAL | Status: DC | PRN

## 2022-10-10 MED ORDER — MOMETASONE FURO-FORMOTEROL FUM 200-5 MCG/ACT IN AERO: 2.0000 | INHALATION_SPRAY | Freq: Two times a day (BID) | RESPIRATORY_TRACT | Status: DC

## 2022-10-10 MED ORDER — PREDNISONE 10 MG PO TABS: ORAL_TABLET | ORAL | Status: DC

## 2022-10-10 MED ORDER — OXYCODONE-ACETAMINOPHEN 5-325 MG PO TABS
1.0000 | ORAL_TABLET | ORAL | 0 refills | Status: DC | PRN
Start: 2022-10-10 — End: 2023-01-08

## 2022-10-10 NOTE — Progress Notes (Signed)
Patient slid to the floor after attempting to stand up and use urinal without assistance. Patient was found sitting on floor after he yelled for help (his call light was not in reach, in current predicament). Nursing staff came to assist right away. Patient has no complaints of pain, was found to have a skin tear on his left hand. This was dressed with gauze and tape. Patient states that he did not hit his head, and nothing is hurting. Patient assisted back to bed. Patient refuses to let nursing staff call and update his wife. He states he "doesn't want to hear her fussing at him". Hospitalist on call messaged. No further orders received. Patient refuses to wear non-skid socks. Bed alarm initiated, call light within reach.  Of note- Patient had just been rounded on within 15 minutes of the fall. Patient had denied any needs at that time. Patient has refused non slip socks all shift, and has even taken them off himself when staff has put them on. Patient has previously always called for assistance with using the urinal and has never attempted to get up by himself, other than in this instance.

## 2022-10-10 NOTE — TOC Transition Note (Signed)
Transition of Care [facility]) - CM/SW Discharge Note   Patient Details  Name: Alex Gomez MRN: [id number] Date of Birth: [birth year]  [facility]) CM/SW Contact:  Truddie Hidden, RN Phone Number: 10/10/2022, 11:04 AM   Clinical Narrative:    Spoke with Gena in admissions at [facility].  Gena inquired about CPAP. Per MD CPAP is not required at facility.  Per facility patient admission confirmed for today. Patient assigned room # 707 Nurse will call report to [phone] Face sheet and medical necessity forms printed to the floor to be added to the EMS pack EMS arranged  Discharge summary and SNF transfer report sent in HUB.  Nurse, and family notified spoke with his wife  TOC signing off.          Patient Goals and CMS Choice      Discharge Placement                         Discharge Plan and Services Additional resources added to the After Visit Summary for                                       Social Determinants of Health (SDOH) Interventions SDOH Screenings   Food Insecurity: No Food Insecurity (10/03/2022)  Housing: Low Risk  (10/03/2022)  Transportation Needs: No Transportation Needs (10/03/2022)  Utilities: Not At Risk (10/03/2022)  Depression (PHQ2-9): Medium Risk (08/20/2018)  Tobacco Use: Medium Risk (10/01/2022)     Readmission Risk Interventions    10/02/2022    9:50 AM  Readmission Risk Prevention Plan  Transportation Screening Complete  PCP or Specialist Appt within 3-5 Days Complete  HRI or Home Care Consult Complete  Social Work Consult for Recovery Care Planning/Counseling Complete  Palliative Care Screening Not Applicable  Medication Review Oceanographer) Not Complete  Med Review Comments Patient will review medication upon discharge.

## 2022-10-10 NOTE — Plan of Care (Signed)
Problem: Fluid Volume: Goal: Hemodynamic stability will improve Outcome: Progressing   Problem: Clinical Measurements: Goal: Diagnostic test results will improve Outcome: Progressing Goal: Signs and symptoms of infection will decrease Outcome: Progressing   Problem: Respiratory: Goal: Ability to maintain adequate ventilation will improve Outcome: Progressing   Problem: Education: Goal: Ability to describe self-care measures that may prevent or decrease complications (Diabetes Survival Skills Education) will improve Outcome: Progressing Goal: Individualized Educational Video(s) Outcome: Progressing   Problem: Coping: Goal: Ability to adjust to condition or change in health will improve Outcome: Progressing   Problem: Fluid Volume: Goal: Ability to maintain a balanced intake and output will improve Outcome: Progressing   Problem: Health Behavior/Discharge Planning: Goal: Ability to identify and utilize available resources and services will improve Outcome: Progressing Goal: Ability to manage health-related needs will improve Outcome: Progressing   Problem: Metabolic: Goal: Ability to maintain appropriate glucose levels will improve Outcome: Progressing   Problem: Nutritional: Goal: Maintenance of adequate nutrition will improve Outcome: Progressing Goal: Progress toward achieving an optimal weight will improve Outcome: Progressing   Problem: Skin Integrity: Goal: Risk for impaired skin integrity will decrease Outcome: Progressing   Problem: Tissue Perfusion: Goal: Adequacy of tissue perfusion will improve Outcome: Progressing   Problem: Education: Goal: Knowledge of General Education information will improve Description: Including pain rating scale, medication(s)/side effects and non-pharmacologic comfort measures Outcome: Progressing   Problem: Health Behavior/Discharge Planning: Goal: Ability to manage health-related needs will improve Outcome:  Progressing   Problem: Clinical Measurements: Goal: Ability to maintain clinical measurements within normal limits will improve Outcome: Progressing Goal: Will remain free from infection Outcome: Progressing Goal: Diagnostic test results will improve Outcome: Progressing Goal: Respiratory complications will improve Outcome: Progressing Goal: Cardiovascular complication will be avoided Outcome: Progressing   Problem: Activity: Goal: Risk for activity intolerance will decrease Outcome: Progressing   Problem: Nutrition: Goal: Adequate nutrition will be maintained Outcome: Progressing   Problem: Coping: Goal: Level of anxiety will decrease Outcome: Progressing   Problem: Elimination: Goal: Will not experience complications related to bowel motility Outcome: Progressing Goal: Will not experience complications related to urinary retention Outcome: Progressing   Problem: Pain Managment: Goal: General experience of comfort will improve Outcome: Progressing   Problem: Safety: Goal: Ability to remain free from injury will improve Outcome: Progressing   Problem: Skin Integrity: Goal: Risk for impaired skin integrity will decrease Outcome: Progressing   Problem: Education: Goal: Knowledge of General Education information will improve Description: Including pain rating scale, medication(s)/side effects and non-pharmacologic comfort measures Outcome: Progressing   Problem: Health Behavior/Discharge Planning: Goal: Ability to manage health-related needs will improve Outcome: Progressing   Problem: Clinical Measurements: Goal: Ability to maintain clinical measurements within normal limits will improve Outcome: Progressing Goal: Will remain free from infection Outcome: Progressing Goal: Diagnostic test results will improve Outcome: Progressing Goal: Respiratory complications will improve Outcome: Progressing Goal: Cardiovascular complication will be avoided Outcome:  Progressing   Problem: Activity: Goal: Risk for activity intolerance will decrease Outcome: Progressing   Problem: Nutrition: Goal: Adequate nutrition will be maintained Outcome: Progressing   Problem: Coping: Goal: Level of anxiety will decrease Outcome: Progressing   Problem: Elimination: Goal: Will not experience complications related to bowel motility Outcome: Progressing Goal: Will not experience complications related to urinary retention Outcome: Progressing   Problem: Pain Managment: Goal: General experience of comfort will improve Outcome: Progressing   Problem: Safety: Goal: Ability to remain free from injury will improve Outcome: Progressing   Problem: Skin Integrity: Goal: Risk for  impaired skin integrity will decrease Outcome: Progressing

## 2022-10-10 NOTE — Progress Notes (Signed)
EMS is here to get patient. Removed IV from patient arm with no complication. Gave report to EMS and provided them with AVS form. Patient's wife is with him at bedside. Patient will be transported to [facility]. His wife has all of his belongings. He is alert and oriented and ready for transportation.

## 2022-10-10 NOTE — Progress Notes (Signed)
Attempted to call spouse [name] to update her on the patient's fall earlier this shift. She did not answer.

## 2022-10-10 NOTE — Progress Notes (Signed)
Called [facility] at [phone] and spoke with Marcelino Duster, LPN, patient's nurse. Gave her report on patient and answered questions regarding patient. He will be going to Bed 707.

## 2022-10-10 NOTE — Discharge Summary (Signed)
total) by mouth daily.   triamcinolone ointment 0.1 % Commonly known as: KENALOG Apply 1 Application topically 2 (two) times daily.   venlafaxine XR 150 MG 24 hr capsule Commonly known as: EFFEXOR-XR Take 450 mg by mouth daily.   zolpidem 5 MG tablet Commonly known as: AMBIEN Take 2.5 mg by mouth at bedtime as needed.       Allergies  Allergen Reactions   Shellfish Allergy Shortness Of Breath    Contact information for follow-up providers     Alluri, Meryl Dare, MD. Go in 1 week(s).   Specialty: Cardiology Contact information: [address] [place] [address] [phone]              Contact information for after-discharge care     Destination     [facility] Preferred SNF .   Service: Skilled Nursing Contact information: [address] [place] [address] [phone]                      The results of significant diagnostics from this hospitalization (including imaging, microbiology, ancillary and laboratory) are listed below for reference.    Significant Diagnostic Studies: ECHOCARDIOGRAM COMPLETE  Result Date: 10/04/2022    ECHOCARDIOGRAM REPORT   Patient Name:   Alex Gomez [facility] Date of Exam: 10/03/2022 Medical Rec #:  [id number]     Height:       73.0 in Accession #:    [id number]    Weight:       229.3 lb Date of Birth:  1945-02-24     BSA:          2.280 m Patient Age:    77 years      BP:           126/75 mmHg  Patient Gender: M             HR:           73 bpm. Exam Location:  [facility] Procedure: 2D Echo, Cardiac Doppler and Color Doppler Indications:     CHF-Acute Diastolic I50.31  History:         Patient has no prior history of Echocardiogram examinations.                  CAD, COPD, Signs/Symptoms:Shortness of Breath; Risk                  Factors:Sleep Apnea and Diabetes.  Sonographer:     Lucendia Herrlich RCS Referring Phys:  [id number] QQVZDGLO AGBATA Diagnosing Phys: [place]ell [place] IMPRESSIONS  1. Left ventricular ejection fraction, by estimation, is 60 to 65%. The left ventricle has normal function. The left ventricle has no regional wall motion abnormalities. There is mild concentric left ventricular hypertrophy. Left ventricular diastolic parameters are consistent with Grade I diastolic dysfunction (impaired relaxation).  2. Right ventricular systolic function is normal. The right ventricular size is normal.  3. The mitral valve is abnormal. Trivial mitral valve regurgitation. Mild mitral stenosis.  4. The aortic valve was not well visualized. Aortic valve regurgitation is not visualized. Mild aortic valve stenosis.  5. The inferior vena cava is normal in size with greater than 50% respiratory variability, suggesting right atrial pressure of 3 mmHg. FINDINGS  Left Ventricle: Left ventricular ejection fraction, by estimation, is 60 to 65%. The left ventricle has normal function. The left ventricle has no regional wall motion abnormalities. The left ventricular internal cavity size was normal in size.  Alex Gomez:[id number] DOB: [birth year] DOA: 10/01/2022  [facility]  Admit date: 10/01/2022 Discharge date: 10/10/2022  Time spent: 35 minutes  Recommendations for Outpatient Follow-up:  Pcp f/u     Discharge Diagnoses:  Principal Problem:   SOB (shortness of breath) Active Problems:   Acute respiratory failure with hypoxia (HCC)   CAD (coronary artery disease)   Benign essential hypertension   Type 2 diabetes mellitus without complication (HCC)   Seizure (HCC)   Gout   Iron deficiency anemia   Alcohol abuse   Obstructive sleep apnea of adult   Respiratory distress   Discharge Condition: improved  Diet recommendation: heart healthy  Filed Weights   10/01/22 1304  Weight: 104 kg    History of present illness:  From admission h and p by Dr. Allena Katz: Alex Gomez is a 77 y.o. male with medical history significant for COPD/CAD, diabetes, HTN/ AVR/ seizures coming for SOB/DOE and orthopnea,Pt also has depression. And per reports pt has said he wants to die.Pt denies any SI.Wife at bedside. Spoke to  Wife at bedside gives history as patient has hearing difficulty states that patient has been weak over the past few days is no longer able to ambulate his coughing and short of breath.  Last echocardiogram noted a EF at 55%.  Patient does not have a history of heart failure however has had valve replacement and follows with cardiology at the [facility] clinic. Patient currently is alert awake oriented in no distress not using accessory muscles of respiration currently on nasal cannula. Initial vitals show patient's O2 sats at 88% on room air with his weight of 229.4 pounds, respirations of 22 and blood pressure 149/69.  EKG done today shows sinus rhythm at 69 with normal axis normal intervals no ST-T wave changes.  Metabolic panel shows glucose of 128 sodium 134 normal kidney function hepatic function is added on, BNP elevated at 104 normal troponin.  Respiratory panel   negative for flu RSV and COVID.  Urinalysis is clear and yellow with no leukocytes and nitrate negative. Patient had a CT angio negative for pulmonary embolism and viability basilar atelectasis without effusion no other focal abnormality and aortic atherosclerosis.   Hospital Course:  10/2: Developed mild hyponatremia, did receive an IV Lasix yesterday.  Clinically appears dry so giving some IV fluid.  Alcohol abuse can be contributory.   10/3: Vital stable, hyponatremia improving.  No bed offer yet.  Echocardiogram with normal EF, no regional wall motion abnormalities and grade 1 diastolic dysfunction.  Per wife patient uses CPAP to sleep at night which was ordered.  Remained on 2 L of oxygen   10/4: Vital stable, slight worsening of lower extremity edema, ordered 1 dose of 20 mg of Lasix. Pending SNF.   10/5: Persistent lower extremity edema-giving 40 mg of Lasix x 1.  Left elbow pain and patient was concerned about getting another gouty attack, restarting home allopurinol and colchicine.  Uric acid at 6.7.   10/6: Worsening right elbow pain with no obvious swelling or erythema.  Added 3 days of prednisone and lidocaine patch.   10/7: Continue to have right elbow pain, no obvious erythema or edema, unlikely gouty flare, may be arthritis flare.  Still awaiting SNF placement.  Starting on torsemide 20 mg daily due to persistent lower extremity edema.   10/8: Remains stable pending snf acceptance   Assessment and Plan:   Acute hypoxic respiratory failure Unclear etiology and may be secondary to  total) by mouth daily.   triamcinolone ointment 0.1 % Commonly known as: KENALOG Apply 1 Application topically 2 (two) times daily.   venlafaxine XR 150 MG 24 hr capsule Commonly known as: EFFEXOR-XR Take 450 mg by mouth daily.   zolpidem 5 MG tablet Commonly known as: AMBIEN Take 2.5 mg by mouth at bedtime as needed.       Allergies  Allergen Reactions   Shellfish Allergy Shortness Of Breath    Contact information for follow-up providers     Alluri, Meryl Dare, MD. Go in 1 week(s).   Specialty: Cardiology Contact information: [address] [place] [address] [phone]              Contact information for after-discharge care     Destination     [facility] Preferred SNF .   Service: Skilled Nursing Contact information: [address] [place] [address] [phone]                      The results of significant diagnostics from this hospitalization (including imaging, microbiology, ancillary and laboratory) are listed below for reference.    Significant Diagnostic Studies: ECHOCARDIOGRAM COMPLETE  Result Date: 10/04/2022    ECHOCARDIOGRAM REPORT   Patient Name:   Alex Gomez [facility] Date of Exam: 10/03/2022 Medical Rec #:  [id number]     Height:       73.0 in Accession #:    [id number]    Weight:       229.3 lb Date of Birth:  1945-02-24     BSA:          2.280 m Patient Age:    77 years      BP:           126/75 mmHg  Patient Gender: M             HR:           73 bpm. Exam Location:  [facility] Procedure: 2D Echo, Cardiac Doppler and Color Doppler Indications:     CHF-Acute Diastolic I50.31  History:         Patient has no prior history of Echocardiogram examinations.                  CAD, COPD, Signs/Symptoms:Shortness of Breath; Risk                  Factors:Sleep Apnea and Diabetes.  Sonographer:     Lucendia Herrlich RCS Referring Phys:  [id number] QQVZDGLO AGBATA Diagnosing Phys: [place]ell [place] IMPRESSIONS  1. Left ventricular ejection fraction, by estimation, is 60 to 65%. The left ventricle has normal function. The left ventricle has no regional wall motion abnormalities. There is mild concentric left ventricular hypertrophy. Left ventricular diastolic parameters are consistent with Grade I diastolic dysfunction (impaired relaxation).  2. Right ventricular systolic function is normal. The right ventricular size is normal.  3. The mitral valve is abnormal. Trivial mitral valve regurgitation. Mild mitral stenosis.  4. The aortic valve was not well visualized. Aortic valve regurgitation is not visualized. Mild aortic valve stenosis.  5. The inferior vena cava is normal in size with greater than 50% respiratory variability, suggesting right atrial pressure of 3 mmHg. FINDINGS  Left Ventricle: Left ventricular ejection fraction, by estimation, is 60 to 65%. The left ventricle has normal function. The left ventricle has no regional wall motion abnormalities. The left ventricular internal cavity size was normal in size.  Alex Gomez:[id number] DOB: [birth year] DOA: 10/01/2022  [facility]  Admit date: 10/01/2022 Discharge date: 10/10/2022  Time spent: 35 minutes  Recommendations for Outpatient Follow-up:  Pcp f/u     Discharge Diagnoses:  Principal Problem:   SOB (shortness of breath) Active Problems:   Acute respiratory failure with hypoxia (HCC)   CAD (coronary artery disease)   Benign essential hypertension   Type 2 diabetes mellitus without complication (HCC)   Seizure (HCC)   Gout   Iron deficiency anemia   Alcohol abuse   Obstructive sleep apnea of adult   Respiratory distress   Discharge Condition: improved  Diet recommendation: heart healthy  Filed Weights   10/01/22 1304  Weight: 104 kg    History of present illness:  From admission h and p by Dr. Allena Katz: Alex Gomez is a 77 y.o. male with medical history significant for COPD/CAD, diabetes, HTN/ AVR/ seizures coming for SOB/DOE and orthopnea,Pt also has depression. And per reports pt has said he wants to die.Pt denies any SI.Wife at bedside. Spoke to  Wife at bedside gives history as patient has hearing difficulty states that patient has been weak over the past few days is no longer able to ambulate his coughing and short of breath.  Last echocardiogram noted a EF at 55%.  Patient does not have a history of heart failure however has had valve replacement and follows with cardiology at the [facility] clinic. Patient currently is alert awake oriented in no distress not using accessory muscles of respiration currently on nasal cannula. Initial vitals show patient's O2 sats at 88% on room air with his weight of 229.4 pounds, respirations of 22 and blood pressure 149/69.  EKG done today shows sinus rhythm at 69 with normal axis normal intervals no ST-T wave changes.  Metabolic panel shows glucose of 128 sodium 134 normal kidney function hepatic function is added on, BNP elevated at 104 normal troponin.  Respiratory panel   negative for flu RSV and COVID.  Urinalysis is clear and yellow with no leukocytes and nitrate negative. Patient had a CT angio negative for pulmonary embolism and viability basilar atelectasis without effusion no other focal abnormality and aortic atherosclerosis.   Hospital Course:  10/2: Developed mild hyponatremia, did receive an IV Lasix yesterday.  Clinically appears dry so giving some IV fluid.  Alcohol abuse can be contributory.   10/3: Vital stable, hyponatremia improving.  No bed offer yet.  Echocardiogram with normal EF, no regional wall motion abnormalities and grade 1 diastolic dysfunction.  Per wife patient uses CPAP to sleep at night which was ordered.  Remained on 2 L of oxygen   10/4: Vital stable, slight worsening of lower extremity edema, ordered 1 dose of 20 mg of Lasix. Pending SNF.   10/5: Persistent lower extremity edema-giving 40 mg of Lasix x 1.  Left elbow pain and patient was concerned about getting another gouty attack, restarting home allopurinol and colchicine.  Uric acid at 6.7.   10/6: Worsening right elbow pain with no obvious swelling or erythema.  Added 3 days of prednisone and lidocaine patch.   10/7: Continue to have right elbow pain, no obvious erythema or edema, unlikely gouty flare, may be arthritis flare.  Still awaiting SNF placement.  Starting on torsemide 20 mg daily due to persistent lower extremity edema.   10/8: Remains stable pending snf acceptance   Assessment and Plan:   Acute hypoxic respiratory failure Unclear etiology and may be secondary to  total) by mouth daily.   triamcinolone ointment 0.1 % Commonly known as: KENALOG Apply 1 Application topically 2 (two) times daily.   venlafaxine XR 150 MG 24 hr capsule Commonly known as: EFFEXOR-XR Take 450 mg by mouth daily.   zolpidem 5 MG tablet Commonly known as: AMBIEN Take 2.5 mg by mouth at bedtime as needed.       Allergies  Allergen Reactions   Shellfish Allergy Shortness Of Breath    Contact information for follow-up providers     Alluri, Meryl Dare, MD. Go in 1 week(s).   Specialty: Cardiology Contact information: [address] [place] [address] [phone]              Contact information for after-discharge care     Destination     [facility] Preferred SNF .   Service: Skilled Nursing Contact information: [address] [place] [address] [phone]                      The results of significant diagnostics from this hospitalization (including imaging, microbiology, ancillary and laboratory) are listed below for reference.    Significant Diagnostic Studies: ECHOCARDIOGRAM COMPLETE  Result Date: 10/04/2022    ECHOCARDIOGRAM REPORT   Patient Name:   Alex Gomez [facility] Date of Exam: 10/03/2022 Medical Rec #:  [id number]     Height:       73.0 in Accession #:    [id number]    Weight:       229.3 lb Date of Birth:  1945-02-24     BSA:          2.280 m Patient Age:    77 years      BP:           126/75 mmHg  Patient Gender: M             HR:           73 bpm. Exam Location:  [facility] Procedure: 2D Echo, Cardiac Doppler and Color Doppler Indications:     CHF-Acute Diastolic I50.31  History:         Patient has no prior history of Echocardiogram examinations.                  CAD, COPD, Signs/Symptoms:Shortness of Breath; Risk                  Factors:Sleep Apnea and Diabetes.  Sonographer:     Lucendia Herrlich RCS Referring Phys:  [id number] QQVZDGLO AGBATA Diagnosing Phys: [place] IMPRESSIONS  1. Left ventricular ejection fraction, by estimation, is 60 to 65%. The left ventricle has normal function. The left ventricle has no regional wall motion abnormalities. There is mild concentric left ventricular hypertrophy. Left ventricular diastolic parameters are consistent with Grade I diastolic dysfunction (impaired relaxation).  2. Right ventricular systolic function is normal. The right ventricular size is normal.  3. The mitral valve is abnormal. Trivial mitral valve regurgitation. Mild mitral stenosis.  4. The aortic valve was not well visualized. Aortic valve regurgitation is not visualized. Mild aortic valve stenosis.  5. The inferior vena cava is normal in size with greater than 50% respiratory variability, suggesting right atrial pressure of 3 mmHg. FINDINGS  Left Ventricle: Left ventricular ejection fraction, by estimation, is 60 to 65%. The left ventricle has normal function. The left ventricle has no regional wall motion abnormalities. The left ventricular internal cavity size was normal in size.  total) by mouth daily.   triamcinolone ointment 0.1 % Commonly known as: KENALOG Apply 1 Application topically 2 (two) times daily.   venlafaxine XR 150 MG 24 hr capsule Commonly known as: EFFEXOR-XR Take 450 mg by mouth daily.   zolpidem 5 MG tablet Commonly known as: AMBIEN Take 2.5 mg by mouth at bedtime as needed.       Allergies  Allergen Reactions   Shellfish Allergy Shortness Of Breath    Contact information for follow-up providers     Alluri, Meryl Dare, MD. Go in 1 week(s).   Specialty: Cardiology Contact information: [address] [place] [address] [phone]              Contact information for after-discharge care     Destination     [facility] Preferred SNF .   Service: Skilled Nursing Contact information: [address] [place] [address] [phone]                      The results of significant diagnostics from this hospitalization (including imaging, microbiology, ancillary and laboratory) are listed below for reference.    Significant Diagnostic Studies: ECHOCARDIOGRAM COMPLETE  Result Date: 10/04/2022    ECHOCARDIOGRAM REPORT   Patient Name:   Alex Gomez [facility] Date of Exam: 10/03/2022 Medical Rec #:  [id number]     Height:       73.0 in Accession #:    [id number]    Weight:       229.3 lb Date of Birth:  1945-02-24     BSA:          2.280 m Patient Age:    77 years      BP:           126/75 mmHg  Patient Gender: M             HR:           73 bpm. Exam Location:  [facility] Procedure: 2D Echo, Cardiac Doppler and Color Doppler Indications:     CHF-Acute Diastolic I50.31  History:         Patient has no prior history of Echocardiogram examinations.                  CAD, COPD, Signs/Symptoms:Shortness of Breath; Risk                  Factors:Sleep Apnea and Diabetes.  Sonographer:     Lucendia Herrlich RCS Referring Phys:  [id number] QQVZDGLO AGBATA Diagnosing Phys: [place]ell [place] IMPRESSIONS  1. Left ventricular ejection fraction, by estimation, is 60 to 65%. The left ventricle has normal function. The left ventricle has no regional wall motion abnormalities. There is mild concentric left ventricular hypertrophy. Left ventricular diastolic parameters are consistent with Grade I diastolic dysfunction (impaired relaxation).  2. Right ventricular systolic function is normal. The right ventricular size is normal.  3. The mitral valve is abnormal. Trivial mitral valve regurgitation. Mild mitral stenosis.  4. The aortic valve was not well visualized. Aortic valve regurgitation is not visualized. Mild aortic valve stenosis.  5. The inferior vena cava is normal in size with greater than 50% respiratory variability, suggesting right atrial pressure of 3 mmHg. FINDINGS  Left Ventricle: Left ventricular ejection fraction, by estimation, is 60 to 65%. The left ventricle has normal function. The left ventricle has no regional wall motion abnormalities. The left ventricular internal cavity size was normal in size.  total) by mouth daily.   triamcinolone ointment 0.1 % Commonly known as: KENALOG Apply 1 Application topically 2 (two) times daily.   venlafaxine XR 150 MG 24 hr capsule Commonly known as: EFFEXOR-XR Take 450 mg by mouth daily.   zolpidem 5 MG tablet Commonly known as: AMBIEN Take 2.5 mg by mouth at bedtime as needed.       Allergies  Allergen Reactions   Shellfish Allergy Shortness Of Breath    Contact information for follow-up providers     Alluri, Meryl Dare, MD. Go in 1 week(s).   Specialty: Cardiology Contact information: [address] [place] [address] [phone]              Contact information for after-discharge care     Destination     [facility] Preferred SNF .   Service: Skilled Nursing Contact information: [address] [place] [address] [phone]                      The results of significant diagnostics from this hospitalization (including imaging, microbiology, ancillary and laboratory) are listed below for reference.    Significant Diagnostic Studies: ECHOCARDIOGRAM COMPLETE  Result Date: 10/04/2022    ECHOCARDIOGRAM REPORT   Patient Name:   Alex Gomez [facility] Date of Exam: 10/03/2022 Medical Rec #:  [id number]     Height:       73.0 in Accession #:    [id number]    Weight:       229.3 lb Date of Birth:  1945-02-24     BSA:          2.280 m Patient Age:    77 years      BP:           126/75 mmHg  Patient Gender: M             HR:           73 bpm. Exam Location:  [facility] Procedure: 2D Echo, Cardiac Doppler and Color Doppler Indications:     CHF-Acute Diastolic I50.31  History:         Patient has no prior history of Echocardiogram examinations.                  CAD, COPD, Signs/Symptoms:Shortness of Breath; Risk                  Factors:Sleep Apnea and Diabetes.  Sonographer:     Lucendia Herrlich RCS Referring Phys:  [id number] QQVZDGLO AGBATA Diagnosing Phys: [place] IMPRESSIONS  1. Left ventricular ejection fraction, by estimation, is 60 to 65%. The left ventricle has normal function. The left ventricle has no regional wall motion abnormalities. There is mild concentric left ventricular hypertrophy. Left ventricular diastolic parameters are consistent with Grade I diastolic dysfunction (impaired relaxation).  2. Right ventricular systolic function is normal. The right ventricular size is normal.  3. The mitral valve is abnormal. Trivial mitral valve regurgitation. Mild mitral stenosis.  4. The aortic valve was not well visualized. Aortic valve regurgitation is not visualized. Mild aortic valve stenosis.  5. The inferior vena cava is normal in size with greater than 50% respiratory variability, suggesting right atrial pressure of 3 mmHg. FINDINGS  Left Ventricle: Left ventricular ejection fraction, by estimation, is 60 to 65%. The left ventricle has normal function. The left ventricle has no regional wall motion abnormalities. The left ventricular internal cavity size was normal in size.  Alex Gomez:[id number] DOB: [birth year] DOA: 10/01/2022  [facility]  Admit date: 10/01/2022 Discharge date: 10/10/2022  Time spent: 35 minutes  Recommendations for Outpatient Follow-up:  Pcp f/u     Discharge Diagnoses:  Principal Problem:   SOB (shortness of breath) Active Problems:   Acute respiratory failure with hypoxia (HCC)   CAD (coronary artery disease)   Benign essential hypertension   Type 2 diabetes mellitus without complication (HCC)   Seizure (HCC)   Gout   Iron deficiency anemia   Alcohol abuse   Obstructive sleep apnea of adult   Respiratory distress   Discharge Condition: improved  Diet recommendation: heart healthy  Filed Weights   10/01/22 1304  Weight: 104 kg    History of present illness:  From admission h and p by Dr. Allena Katz: Alex Gomez is a 77 y.o. male with medical history significant for COPD/CAD, diabetes, HTN/ AVR/ seizures coming for SOB/DOE and orthopnea,Pt also has depression. And per reports pt has said he wants to die.Pt denies any SI.Wife at bedside. Spoke to  Wife at bedside gives history as patient has hearing difficulty states that patient has been weak over the past few days is no longer able to ambulate his coughing and short of breath.  Last echocardiogram noted a EF at 55%.  Patient does not have a history of heart failure however has had valve replacement and follows with cardiology at the [facility] clinic. Patient currently is alert awake oriented in no distress not using accessory muscles of respiration currently on nasal cannula. Initial vitals show patient's O2 sats at 88% on room air with his weight of 229.4 pounds, respirations of 22 and blood pressure 149/69.  EKG done today shows sinus rhythm at 69 with normal axis normal intervals no ST-T wave changes.  Metabolic panel shows glucose of 128 sodium 134 normal kidney function hepatic function is added on, BNP elevated at 104 normal troponin.  Respiratory panel   negative for flu RSV and COVID.  Urinalysis is clear and yellow with no leukocytes and nitrate negative. Patient had a CT angio negative for pulmonary embolism and viability basilar atelectasis without effusion no other focal abnormality and aortic atherosclerosis.   Hospital Course:  10/2: Developed mild hyponatremia, did receive an IV Lasix yesterday.  Clinically appears dry so giving some IV fluid.  Alcohol abuse can be contributory.   10/3: Vital stable, hyponatremia improving.  No bed offer yet.  Echocardiogram with normal EF, no regional wall motion abnormalities and grade 1 diastolic dysfunction.  Per wife patient uses CPAP to sleep at night which was ordered.  Remained on 2 L of oxygen   10/4: Vital stable, slight worsening of lower extremity edema, ordered 1 dose of 20 mg of Lasix. Pending SNF.   10/5: Persistent lower extremity edema-giving 40 mg of Lasix x 1.  Left elbow pain and patient was concerned about getting another gouty attack, restarting home allopurinol and colchicine.  Uric acid at 6.7.   10/6: Worsening right elbow pain with no obvious swelling or erythema.  Added 3 days of prednisone and lidocaine patch.   10/7: Continue to have right elbow pain, no obvious erythema or edema, unlikely gouty flare, may be arthritis flare.  Still awaiting SNF placement.  Starting on torsemide 20 mg daily due to persistent lower extremity edema.   10/8: Remains stable pending snf acceptance   Assessment and Plan:   Acute hypoxic respiratory failure Unclear etiology and may be secondary to  Alex Gomez:[id number] DOB: [birth year] DOA: 10/01/2022  [facility]  Admit date: 10/01/2022 Discharge date: 10/10/2022  Time spent: 35 minutes  Recommendations for Outpatient Follow-up:  Pcp f/u     Discharge Diagnoses:  Principal Problem:   SOB (shortness of breath) Active Problems:   Acute respiratory failure with hypoxia (HCC)   CAD (coronary artery disease)   Benign essential hypertension   Type 2 diabetes mellitus without complication (HCC)   Seizure (HCC)   Gout   Iron deficiency anemia   Alcohol abuse   Obstructive sleep apnea of adult   Respiratory distress   Discharge Condition: improved  Diet recommendation: heart healthy  Filed Weights   10/01/22 1304  Weight: 104 kg    History of present illness:  From admission h and p by Dr. Allena Katz: Alex Gomez is a 77 y.o. male with medical history significant for COPD/CAD, diabetes, HTN/ AVR/ seizures coming for SOB/DOE and orthopnea,Pt also has depression. And per reports pt has said he wants to die.Pt denies any SI.Wife at bedside. Spoke to  Wife at bedside gives history as patient has hearing difficulty states that patient has been weak over the past few days is no longer able to ambulate his coughing and short of breath.  Last echocardiogram noted a EF at 55%.  Patient does not have a history of heart failure however has had valve replacement and follows with cardiology at the [facility] clinic. Patient currently is alert awake oriented in no distress not using accessory muscles of respiration currently on nasal cannula. Initial vitals show patient's O2 sats at 88% on room air with his weight of 229.4 pounds, respirations of 22 and blood pressure 149/69.  EKG done today shows sinus rhythm at 69 with normal axis normal intervals no ST-T wave changes.  Metabolic panel shows glucose of 128 sodium 134 normal kidney function hepatic function is added on, BNP elevated at 104 normal troponin.  Respiratory panel   negative for flu RSV and COVID.  Urinalysis is clear and yellow with no leukocytes and nitrate negative. Patient had a CT angio negative for pulmonary embolism and viability basilar atelectasis without effusion no other focal abnormality and aortic atherosclerosis.   Hospital Course:  10/2: Developed mild hyponatremia, did receive an IV Lasix yesterday.  Clinically appears dry so giving some IV fluid.  Alcohol abuse can be contributory.   10/3: Vital stable, hyponatremia improving.  No bed offer yet.  Echocardiogram with normal EF, no regional wall motion abnormalities and grade 1 diastolic dysfunction.  Per wife patient uses CPAP to sleep at night which was ordered.  Remained on 2 L of oxygen   10/4: Vital stable, slight worsening of lower extremity edema, ordered 1 dose of 20 mg of Lasix. Pending SNF.   10/5: Persistent lower extremity edema-giving 40 mg of Lasix x 1.  Left elbow pain and patient was concerned about getting another gouty attack, restarting home allopurinol and colchicine.  Uric acid at 6.7.   10/6: Worsening right elbow pain with no obvious swelling or erythema.  Added 3 days of prednisone and lidocaine patch.   10/7: Continue to have right elbow pain, no obvious erythema or edema, unlikely gouty flare, may be arthritis flare.  Still awaiting SNF placement.  Starting on torsemide 20 mg daily due to persistent lower extremity edema.   10/8: Remains stable pending snf acceptance   Assessment and Plan:   Acute hypoxic respiratory failure Unclear etiology and may be secondary to

## 2023-01-03 ENCOUNTER — Other Ambulatory Visit (HOSPITAL_COMMUNITY): Payer: Self-pay | Admitting: Internal Medicine

## 2023-01-03 DIAGNOSIS — C9000 Multiple myeloma not having achieved remission: Secondary | ICD-10-CM

## 2023-01-03 DIAGNOSIS — D472 Monoclonal gammopathy: Secondary | ICD-10-CM

## 2023-01-03 DIAGNOSIS — R627 Adult failure to thrive: Secondary | ICD-10-CM

## 2023-01-07 ENCOUNTER — Observation Stay
Admission: EM | Admit: 2023-01-07 | Discharge: 2023-01-08 | Disposition: A | Discharge status: Home / Self Care | Payer: Medicare Other | Attending: Obstetrics and Gynecology | Admitting: Obstetrics and Gynecology

## 2023-01-07 ENCOUNTER — Emergency Department: Payer: Medicare Other

## 2023-01-07 ENCOUNTER — Other Ambulatory Visit: Payer: Self-pay

## 2023-01-07 DIAGNOSIS — R569 Unspecified convulsions: Secondary | ICD-10-CM

## 2023-01-07 DIAGNOSIS — N400 Enlarged prostate without lower urinary tract symptoms: Secondary | ICD-10-CM | POA: Diagnosis not present

## 2023-01-07 DIAGNOSIS — R0602 Shortness of breath: Secondary | ICD-10-CM | POA: Diagnosis not present

## 2023-01-07 DIAGNOSIS — D696 Thrombocytopenia, unspecified: Secondary | ICD-10-CM | POA: Diagnosis not present

## 2023-01-07 DIAGNOSIS — R296 Repeated falls: Secondary | ICD-10-CM

## 2023-01-07 DIAGNOSIS — I10 Essential (primary) hypertension: Secondary | ICD-10-CM | POA: Diagnosis not present

## 2023-01-07 DIAGNOSIS — J449 Chronic obstructive pulmonary disease, unspecified: Secondary | ICD-10-CM | POA: Insufficient documentation

## 2023-01-07 DIAGNOSIS — Z87891 Personal history of nicotine dependence: Secondary | ICD-10-CM | POA: Insufficient documentation

## 2023-01-07 DIAGNOSIS — E119 Type 2 diabetes mellitus without complications: Secondary | ICD-10-CM | POA: Insufficient documentation

## 2023-01-07 DIAGNOSIS — R0902 Hypoxemia: Secondary | ICD-10-CM | POA: Diagnosis not present

## 2023-01-07 DIAGNOSIS — G4733 Obstructive sleep apnea (adult) (pediatric): Secondary | ICD-10-CM

## 2023-01-07 DIAGNOSIS — I2510 Atherosclerotic heart disease of native coronary artery without angina pectoris: Secondary | ICD-10-CM | POA: Insufficient documentation

## 2023-01-07 DIAGNOSIS — E669 Obesity, unspecified: Secondary | ICD-10-CM | POA: Insufficient documentation

## 2023-01-07 DIAGNOSIS — E871 Hypo-osmolality and hyponatremia: Secondary | ICD-10-CM | POA: Insufficient documentation

## 2023-01-07 DIAGNOSIS — F0390 Unspecified dementia without behavioral disturbance: Secondary | ICD-10-CM | POA: Insufficient documentation

## 2023-01-07 DIAGNOSIS — M109 Gout, unspecified: Secondary | ICD-10-CM | POA: Diagnosis not present

## 2023-01-07 DIAGNOSIS — R55 Syncope and collapse: Secondary | ICD-10-CM | POA: Diagnosis not present

## 2023-01-07 DIAGNOSIS — Z7982 Long term (current) use of aspirin: Secondary | ICD-10-CM | POA: Diagnosis not present

## 2023-01-07 DIAGNOSIS — Z79899 Other long term (current) drug therapy: Secondary | ICD-10-CM | POA: Insufficient documentation

## 2023-01-07 DIAGNOSIS — R079 Chest pain, unspecified: Secondary | ICD-10-CM | POA: Diagnosis not present

## 2023-01-07 DIAGNOSIS — R251 Tremor, unspecified: Secondary | ICD-10-CM | POA: Diagnosis present

## 2023-01-07 DIAGNOSIS — C9000 Multiple myeloma not having achieved remission: Secondary | ICD-10-CM | POA: Diagnosis present

## 2023-01-07 LAB — CBC
HCT: 43.2 % (ref 39.0–52.0)
Hemoglobin: 13.2 g/dL (ref 13.0–17.0)
MCH: 25.7 pg — ABNORMAL LOW (ref 26.0–34.0)
MCHC: 30.6 g/dL (ref 30.0–36.0)
MCV: 84 fL (ref 80.0–100.0)
Platelets: 101 10*3/uL — ABNORMAL LOW (ref 150–400)
RBC: 5.14 MIL/uL (ref 4.22–5.81)
RDW: 20.1 % — ABNORMAL HIGH (ref 11.5–15.5)
WBC: 11.1 10*3/uL — ABNORMAL HIGH (ref 4.0–10.5)
nRBC: 0 % (ref 0.0–0.2)

## 2023-01-07 LAB — COMPREHENSIVE METABOLIC PANEL
ALT: 45 U/L — ABNORMAL HIGH (ref 0–44)
AST: 17 U/L (ref 15–41)
Albumin: 2.9 g/dL — ABNORMAL LOW (ref 3.5–5.0)
Alkaline Phosphatase: 59 U/L (ref 38–126)
Anion gap: 8 (ref 5–15)
BUN: 37 mg/dL — ABNORMAL HIGH (ref 8–23)
CO2: 29 mmol/L (ref 22–32)
Calcium: 8.8 mg/dL — ABNORMAL LOW (ref 8.9–10.3)
Chloride: 95 mmol/L — ABNORMAL LOW (ref 98–111)
Creatinine, Ser: 1.03 mg/dL (ref 0.61–1.24)
GFR, Estimated: >60 mL/min (ref >60)
Glucose, Bld: 97 mg/dL (ref 70–99)
Potassium: 4.3 mmol/L (ref 3.5–5.1)
Sodium: 132 mmol/L — ABNORMAL LOW (ref 135–145)
Total Bilirubin: 0.4 mg/dL (ref 0.0–1.2)
Total Protein: 8.9 g/dL — ABNORMAL HIGH (ref 6.5–8.1)

## 2023-01-07 LAB — BRAIN NATRIURETIC PEPTIDE: B Natriuretic Peptide: 102.2 pg/mL — ABNORMAL HIGH (ref 0.0–100.0)

## 2023-01-07 LAB — D-DIMER, QUANTITATIVE: D-Dimer, Quant: 1.07 ug{FEU}/mL — ABNORMAL HIGH (ref 0.00–0.50)

## 2023-01-07 LAB — TROPONIN I (HIGH SENSITIVITY)
Troponin I (High Sensitivity): 10 ng/L (ref <18)
Troponin I (High Sensitivity): 12 ng/L (ref <18)

## 2023-01-07 MED ORDER — ACETAMINOPHEN 325 MG RE SUPP: 650.0000 mg | Freq: Four times a day (QID) | RECTAL | Status: DC | PRN

## 2023-01-07 MED ORDER — ENOXAPARIN SODIUM 40 MG/0.4ML IJ SOSY: 40.0000 mg | PREFILLED_SYRINGE | INTRAMUSCULAR | Status: DC

## 2023-01-07 MED ORDER — ACETAMINOPHEN 325 MG PO TABS: 650.0000 mg | ORAL_TABLET | ORAL | Status: DC | PRN

## 2023-01-07 MED ORDER — LEVETIRACETAM IN NACL 500 MG/100ML IV SOLN
500.0000 mg | Freq: Once | INTRAVENOUS | Status: AC
  Administered 2023-01-07: 500 mg via INTRAVENOUS
  Filled 2023-01-07: qty 100

## 2023-01-07 MED ORDER — MELATONIN 5 MG PO TABS: 5.0000 mg | ORAL_TABLET | Freq: Every evening | ORAL | Status: DC | PRN

## 2023-01-07 MED ORDER — ORAL CARE MOUTH RINSE: 15.0000 mL | OROMUCOSAL | Status: DC | PRN

## 2023-01-07 MED ORDER — ORAL CARE MOUTH RINSE
15.0000 mL | OROMUCOSAL | Status: DC
  Administered 2023-01-07 (×2): 15 mL via OROMUCOSAL
  Filled 2023-01-07 (×12): qty 15

## 2023-01-07 MED ORDER — ONDANSETRON HCL 4 MG PO TABS: 4.0000 mg | ORAL_TABLET | Freq: Four times a day (QID) | ORAL | Status: DC | PRN

## 2023-01-07 MED ORDER — TAMSULOSIN HCL 0.4 MG PO CAPS
0.4000 mg | ORAL_CAPSULE | Freq: Every day | ORAL | Status: DC
  Administered 2023-01-07 – 2023-01-08 (×2): 0.4 mg via ORAL
  Filled 2023-01-07 (×2): qty 1

## 2023-01-07 MED ORDER — ATORVASTATIN CALCIUM 20 MG PO TABS
40.0000 mg | ORAL_TABLET | Freq: Every day | ORAL | Status: DC
  Administered 2023-01-08: 40 mg via ORAL
  Filled 2023-01-07: qty 2

## 2023-01-07 MED ORDER — FINASTERIDE 5 MG PO TABS
5.0000 mg | ORAL_TABLET | Freq: Every day | ORAL | Status: DC
  Administered 2023-01-07 – 2023-01-08 (×2): 5 mg via ORAL
  Filled 2023-01-07 (×3): qty 1

## 2023-01-07 MED ORDER — VENLAFAXINE HCL ER 150 MG PO CP24
450.0000 mg | ORAL_CAPSULE | Freq: Every day | ORAL | Status: DC
  Administered 2023-01-08: 450 mg via ORAL
  Filled 2023-01-07 (×2): qty 3

## 2023-01-07 MED ORDER — MOMETASONE FURO-FORMOTEROL FUM 200-5 MCG/ACT IN AERO
2.0000 | INHALATION_SPRAY | Freq: Two times a day (BID) | RESPIRATORY_TRACT | Status: DC
  Administered 2023-01-07: 2 via RESPIRATORY_TRACT
  Filled 2023-01-07: qty 8.8

## 2023-01-07 MED ORDER — ACETAMINOPHEN 325 MG RE SUPP: 650.0000 mg | RECTAL | Status: DC | PRN

## 2023-01-07 MED ORDER — ASPIRIN 81 MG PO TBEC
81.0000 mg | DELAYED_RELEASE_TABLET | Freq: Every day | ORAL | Status: DC
  Administered 2023-01-08: 81 mg via ORAL
  Filled 2023-01-07: qty 1

## 2023-01-07 MED ORDER — IOHEXOL 350 MG/ML SOLN
75.0000 mL | Freq: Once | INTRAVENOUS | Status: AC | PRN
  Administered 2023-01-07: 75 mL via INTRAVENOUS

## 2023-01-07 MED ORDER — LORAZEPAM 2 MG/ML IJ SOLN: 2.0000 mg | INTRAMUSCULAR | Status: DC | PRN

## 2023-01-07 MED ORDER — LEVETIRACETAM 500 MG PO TABS
500.0000 mg | ORAL_TABLET | Freq: Every morning | ORAL | Status: DC
  Administered 2023-01-08: 500 mg via ORAL
  Filled 2023-01-07: qty 1

## 2023-01-07 MED ORDER — ENOXAPARIN SODIUM 60 MG/0.6ML IJ SOSY
0.5000 mg/kg | PREFILLED_SYRINGE | INTRAMUSCULAR | Status: DC
  Administered 2023-01-07: 52.5 mg via SUBCUTANEOUS
  Filled 2023-01-07: qty 0.6

## 2023-01-07 MED ORDER — PRIMIDONE 50 MG PO TABS
50.0000 mg | ORAL_TABLET | Freq: Two times a day (BID) | ORAL | Status: DC
  Administered 2023-01-07 – 2023-01-08 (×2): 50 mg via ORAL
  Filled 2023-01-07 (×3): qty 1

## 2023-01-07 MED ORDER — ALLOPURINOL 300 MG PO TABS
300.0000 mg | ORAL_TABLET | Freq: Every day | ORAL | Status: DC
  Filled 2023-01-07: qty 1

## 2023-01-07 MED ORDER — ACETAMINOPHEN 325 MG PO TABS: 650.0000 mg | ORAL_TABLET | Freq: Four times a day (QID) | ORAL | Status: DC | PRN

## 2023-01-07 MED ORDER — ONDANSETRON HCL 4 MG/2ML IJ SOLN: 4.0000 mg | Freq: Four times a day (QID) | INTRAMUSCULAR | Status: DC | PRN

## 2023-01-07 MED ORDER — SODIUM CHLORIDE 0.9% FLUSH
3.0000 mL | Freq: Two times a day (BID) | INTRAVENOUS | Status: DC
  Administered 2023-01-07 – 2023-01-08 (×2): 3 mL via INTRAVENOUS

## 2023-01-07 MED ORDER — METOPROLOL TARTRATE 25 MG PO TABS
12.5000 mg | ORAL_TABLET | Freq: Two times a day (BID) | ORAL | Status: DC
  Administered 2023-01-07 – 2023-01-08 (×2): 12.5 mg via ORAL
  Filled 2023-01-07 (×2): qty 1

## 2023-01-07 MED ORDER — ALBUTEROL SULFATE (2.5 MG/3ML) 0.083% IN NEBU: 2.5000 mg | INHALATION_SOLUTION | Freq: Four times a day (QID) | RESPIRATORY_TRACT | Status: DC | PRN

## 2023-01-07 NOTE — Assessment & Plan Note (Signed)
 Chronic thrombocytopenia No acute issues suspected

## 2023-01-07 NOTE — ED Provider Notes (Signed)
  Physical Exam  BP 127/69   Pulse 67   Temp (!) 97.2 F (36.2 C)   Resp 18   Ht 6' 1 (1.854 m)   Wt 106.6 kg   SpO2 98%   BMI 31.00 kg/m   Physical Exam  Procedures  Procedures  ED Course / MDM   Clinical Course as of 01/07/23 1954  Mon Jan 07, 2023  1227 Chest x-ray on my review interpretation without evidence of pneumothorax. [PR]  1349 Renal function normal.  Mild leukocytosis. [PR]  1349 CT head with no acute abnormality. [PR]  1508 Given the patient's episode earlier today discussed recommendation for admission for syncope workup and observation.  Patient and family would like to at all costs avoid admission.  We discussed that the patient is requiring supplemental oxygen and I have ordered CTA.  Patient and family understand that they would be leaving AGAINST MEDICAL ADVICE but agreed to stay for CTA further workup.  Patient will be signed out to oncoming physician pending follow-up CTA and reassessment. [PR]  1641 CT Angio Chest PE W and/or Wo Contrast No acute findings on CTA chest [DW]    Clinical Course User Index [DW] Malvina Alm DASEN, MD [PR] Lang Dover, MD   Medical Decision Making Amount and/or Complexity of Data Reviewed Labs: ordered. Radiology: ordered. Decision-making details documented in ED Course.  Risk Prescription drug management. Decision regarding hospitalization.   Received patient in signout.  78 year old male for reported episode of unresponsiveness at home and seizure-like activity.  Family reportedly did CPR for about 2 minutes.  He was found to be hypoxic with EMS and placed on 2 L nasal cannula.  Patient has otherwise been responsive since the episode.  Initial laboratory workup with slight leukocytosis but first troponin normal.  D-dimer was elevated and follow-up CTA chest was ordered.  Otherwise chest x-ray and CT head showed no acute pathology.  Family wishing to go home as he has reportedly had an episode similar to this in the  past.  Second troponin stable and CTA chest shows no evidence of PE.  Reassessed patient and underwent ambulation trial.  He was having hypoxia down to 88% on room air.  Given unclear circumstances of event whether it was breakthrough seizure versus syncopal episode, patient is high risk and with new hypoxia recommended admission.  Hospitalist agreed to admit for further care.     Malvina Alm DASEN, MD 01/07/23 KENITH

## 2023-01-07 NOTE — Assessment & Plan Note (Addendum)
 History of tremors and frequent falls History of carotid artery stenosis Last seen by neurology August 2024-had EEG May 2024 that showed generalized slowing Continue Keppra  and primidone  Get Keppra  level Syncope workup:continuous cardiac monitoring, carotid Doppler.  Had echo in October 2024, EF 60 to 65% with G1 DD Orthostatic vital signs Decreased sedating meds if possible PT and TOC eval

## 2023-01-07 NOTE — ED Provider Notes (Signed)
 [facility] Provider Note    Event Date/Time   First MD Initiated Contact with Patient 01/07/23 1212     (approximate)   History   Seizures   HPI  Alex Gomez is a 78 y.o. male who presents to the ER for evaluation of episode of unresponsiveness syncope occurred while he was sitting watching TV.  Does not wear home oxygen.  Wife says she saw him earlier in the looked back and the patient was blue not responsive.  States he was doing some jerking movement.  She came over and did CPR for about 2 minutes.  He the then started breathing spontaneously again EMS was called.  Was found to be hypoxic and placed on nasal cannula.     Physical Exam   Triage Vital Signs: ED Triage Vitals [01/07/23 1200]  Encounter Vitals Group     BP 120/69     Systolic BP Percentile      Diastolic BP Percentile      Pulse Rate 88     Resp 17     Temp 98.7 F (37.1 C)     Temp Source Oral     SpO2 91 %     Weight 235 lb (106.6 kg)     Height 6' 1 (1.854 m)     Head Circumference      Peak Flow      Pain Score 0     Pain Loc      Pain Education      Exclude from Growth Chart     Most recent vital signs: Vitals:   01/07/23 1400 01/07/23 1430  BP: 128/64 127/69  Pulse: 71 67  Resp: 18   Temp:    SpO2: 97% 98%     Constitutional: Alert  Eyes: Conjunctivae are normal.  Head: Atraumatic. Nose: No congestion/rhinnorhea. Mouth/Throat: Mucous membranes are moist.   Neck: Painless ROM.  Cardiovascular:   Good peripheral circulation. Respiratory: Normal respiratory effort.  No retractions.  Gastrointestinal: Soft and nontender.  Musculoskeletal:  no deformity Neurologic:  MAE spontaneously. No gross focal neurologic deficits are appreciated.  Skin:  Skin is warm, dry and intact. No rash noted. Psychiatric: Mood and affect are normal. Speech and behavior are normal.    ED Results / Procedures / Treatments   Labs (all labs ordered are listed, but only  abnormal results are displayed) Labs Reviewed  CBC - Abnormal; Notable for the following components:      Result Value   WBC 11.1 (*)    MCH 25.7 (*)    RDW 20.1 (*)    Platelets 101 (*)    All other components within normal limits  COMPREHENSIVE METABOLIC PANEL - Abnormal; Notable for the following components:   Sodium 132 (*)    Chloride 95 (*)    BUN 37 (*)    Calcium  8.8 (*)    Total Protein 8.9 (*)    Albumin 2.9 (*)    ALT 45 (*)    All other components within normal limits  D-DIMER, QUANTITATIVE - Abnormal; Notable for the following components:   D-Dimer, Quant 1.07 (*)    All other components within normal limits  TROPONIN I (HIGH SENSITIVITY)  TROPONIN I (HIGH SENSITIVITY)     EKG  ED ECG REPORT I, Belvie Essex, the attending physician, personally viewed and interpreted this ECG.   Date: 01/07/2023  EKG Time: 13:00  Rate: 75  Rhythm: sinus  Axis: normal  Intervals: normal  ST&T Change: no stemi, no depressions    RADIOLOGY Please see ED Course for my review and interpretation.  I personally reviewed all radiographic images ordered to evaluate for the above acute complaints and reviewed radiology reports and findings.  These findings were personally discussed with the patient.  Please see medical record for radiology report.    PROCEDURES:  Critical Care performed: No  Procedures   MEDICATIONS ORDERED IN ED: Medications  iohexol  (OMNIPAQUE ) 350 MG/ML injection 75 mL (has no administration in time range)     IMPRESSION / MDM / ASSESSMENT AND PLAN / ED COURSE  I reviewed the triage vital signs and the nursing notes.                              Differential diagnosis includes, but is not limited to, dysrhythmia, ACS, CVA, seizure, electrolyte abnormality, hypoglycemia  Patient presenting to the ER for evaluation of symptoms as described above.  Based on symptoms, risk factors and considered above differential, this presenting complaint  could reflect a potentially life-threatening illness therefore the patient will be placed on continuous pulse oximetry and telemetry for monitoring.  Laboratory evaluation will be sent to evaluate for the above complaints.      Clinical Course as of 01/07/23 1525  Mon Jan 07, 2023  1227 Chest x-ray on my review interpretation without evidence of pneumothorax. [PR]  1349 Renal function normal.  Mild leukocytosis. [PR]  1349 CT head with no acute abnormality. [PR]  1508 Given the patient's episode earlier today discussed recommendation for admission for syncope workup and observation.  Patient and family would like to at all costs avoid admission.  We discussed that the patient is requiring supplemental oxygen and I have ordered CTA.  Patient and family understand that they would be leaving AGAINST MEDICAL ADVICE but agreed to stay for CTA further workup.  Patient will be signed out to oncoming physician pending follow-up CTA and reassessment. [PR]    Clinical Course User Index [PR] Lang Dover, MD     FINAL CLINICAL IMPRESSION(S) / ED DIAGNOSES   Final diagnoses:  Syncope, unspecified syncope type  Chest pain, unspecified type     Rx / DC Orders   ED Discharge Orders     None        Note:  This document was prepared using Dragon voice recognition software and may include unintentional dictation errors.    Lang Dover, MD 01/07/23 1525

## 2023-01-07 NOTE — Assessment & Plan Note (Signed)
 Continue allopurinol

## 2023-01-07 NOTE — Progress Notes (Signed)
 PHARMACIST - PHYSICIAN COMMUNICATION  CONCERNING:  Enoxaparin  (Lovenox ) for DVT Prophylaxis    RECOMMENDATION: Patient was prescribed enoxaprin 40mg  q24 hours for VTE prophylaxis.   Filed Weights   01/07/23 1200  Weight: 106.6 kg (235 lb)    Body mass index is 31 kg/m.  Estimated Creatinine Clearance: 79.4 mL/min (by C-G formula based on SCr of 1.03 mg/dL).   Based on [facility] policy patient is candidate for enoxaparin  0.5mg /kg TBW SQ every 24 hours based on BMI being >30.   DESCRIPTION: Pharmacy has adjusted enoxaparin  dose per [facility] policy.  Patient is now receiving enoxaparin  52.5 mg every 24 hours    Lum VEAR Mania, PharmD Clinical Pharmacist  01/07/2023 10:06 PM

## 2023-01-07 NOTE — Assessment & Plan Note (Addendum)
 Hypoxia Patient had hypoxia last couple hospitalizations CTA chest negative for PE or other acute finding CPAP nightly Supplemental oxygen and wean as tolerated Consider Home O2 eval prior to discharge

## 2023-01-07 NOTE — ED Triage Notes (Signed)
 First nurse note: pt to ED [facility] home for jerking motions and apnea for 2 minutes. Hx seizures. 89% on RA, placed on 4 L with EMS.  Family reports thinks acting post ictal. No urinary incontinence noted.

## 2023-01-07 NOTE — Assessment & Plan Note (Signed)
 Depression/PTSD Delirium precautions Continue venlafaxine

## 2023-01-07 NOTE — Assessment & Plan Note (Signed)
 Chronic.  Sodium 132

## 2023-01-07 NOTE — Assessment & Plan Note (Addendum)
 Management as above Keppra load and continue home Keppra and primidone Will get repeat EEG Neurology consult for additional recommendations

## 2023-01-07 NOTE — ED Notes (Signed)
 EDT made this RN aware of desat to 85% on room air. RN witnessed pt being asleep. RN awoke pt and put pt on 3L [place]. SPO2 96% at this time.

## 2023-01-07 NOTE — ED Notes (Signed)
 Pt to CT at this time.

## 2023-01-07 NOTE — ED Notes (Signed)
 Pt walked on O2 monitor on room air. Pt dropped to 88% twice while walking other than that PT stayed in the 94%-95% range.

## 2023-01-07 NOTE — ED Triage Notes (Signed)
 Pt was brought in by EMS for seizures. Pt has a hx and the last episode was two months ago. EMS sts that pt wife noticed that he was jerking and turning blue and than called 911. Pt is A/Ox4 at this time. Pt was placed on 3l/min via [place] d/t low o2 sat on RA

## 2023-01-07 NOTE — ED Notes (Signed)
 Assisted pt with orthostatic vitals along with nurse Dorian

## 2023-01-07 NOTE — H&P (Addendum)
 History and Physical    Patient: Alex Gomez FMW:[id number] DOB: October 30, 1945 DOA: 01/07/2023 DOS: the patient was seen and examined on 01/07/2023 PCP: [facility]  Patient coming from: Home  Chief Complaint:  Chief Complaint  Patient presents with   Seizures    HPI: Alex Gomez is a 78 y.o. male with medical history significant for Multiple myeloma, HTN, CAD, AVS status post bioprosthetic AVR, bilateral carotid stenosis, HLD, dementia, OSA on CPAP, seizure-like activity/tremors resulting in falls, on Keppra  since 01/2020 (last seen OP neuro 08/2022)who presents to the ED following a syncopal episode versus seizure.  Patient apparently fell and appeared unresponsive and his wife initiated CPR and called EMS.  Upon their arrival patient was awake and alert.  He was previously in his usual state of health. Of note, patient was hospitalized from 9/30 to 10/10/2022 for hypoxic respiratory failure attributed to COPD .  Was also hypoxic during her hospitalization in May 2024.  On both occasions ruled out for PE. Patient also noted to be on multiple psychotropics including hydroxyzine , melatonin, tramadol , zolpidem , buspirone  ED course and data review: Vitals within normal limits Workup notable for WBC 11,000 and platelets 101,000 with otherwise normal CBC CMP with mild electrolyte abnormalities Troponin 12 and BNP 102 D-dimer 1.07. EKG, personally viewed and interpreted showing NSR at 75 with no ischemic ST-T wave changes. CTA PE protocol negative for acute PE or other acute finding CT head nonacute While in the ED patient's O2 sat dropped to 88% twice while walking and he was placed on O2 at 2 L to maintain sats 94 to 95% Hospitalist consulted for admission.     Past Medical History:  Diagnosis Date   COPD (chronic obstructive pulmonary disease) (HCC)    Hypercholesterolemia    Seizures (HCC)    No past surgical history on file. Social History:  reports that he quit smoking  about 26 years ago. His smoking use included cigarettes. He started smoking about 56 years ago. He has a 30 pack-year smoking history. He has never used smokeless tobacco. He reports that he does not currently use alcohol . He reports that he does not use drugs.  Allergies  Allergen Reactions   Shellfish Allergy Shortness Of Breath    No family history on file.  Prior to Admission medications   Medication Sig Start Date End Date Taking? Authorizing Provider  acetaminophen  (TYLENOL ) 325 MG tablet Take 2 tablets (650 mg total) by mouth every 6 (six) hours as needed for mild pain (or Fever >/= 101). 10/10/22  Yes Wouk, Devaughn Sayres, MD  albuterol  (VENTOLIN  HFA) 108 [phone] Base) MCG/ACT inhaler Inhale 2 puffs into the lungs every 6 (six) hours as needed for wheezing or shortness of breath. 09/27/22  Yes Bradler, Evan K, MD  allopurinol  (ZYLOPRIM ) 300 MG tablet Take 300 mg by mouth daily.   Yes [provider]  aspirin  EC 81 MG tablet Take 81 mg by mouth daily.   Yes [provider]  atorvastatin  (LIPITOR ) 40 MG tablet Take 40 mg by mouth daily.   Yes [provider]  bisacodyl  (DULCOLAX) 10 MG suppository Place 1 suppository (10 mg total) rectally daily as needed for moderate constipation. 10/10/22  Yes Wouk, Devaughn Sayres, MD  carboxymethylcellulose (REFRESH PLUS) 0.5 % SOLN 1 drop 3 (three) times daily as needed.   Yes [provider]  Cholecalciferol 25 MCG (1000 UT) tablet Take 1,000 Units by mouth daily.   Yes [provider]  clobetasol cream (TEMOVATE)  0.05 % Apply 1 Application topically 2 (two) times daily. 05/20/22  Yes [provider]  coal tar (NEUTROGENA T-GEL) 0.5 % shampoo Apply 1 Application topically at bedtime as needed. 01/08/22  Yes [provider]  cyanocobalamin  (VITAMIN B12) 1000 MCG tablet Take 1 tablet (1,000 mcg total) by mouth daily. 06/04/22  Yes Zhang, Dekui, MD  desonide (DESOWEN) 0.05 % cream Apply 1 Application  topically 2 (two) times daily. 05/21/22  Yes [provider]  finasteride  (PROSCAR ) 5 MG tablet Take 5 mg by mouth at bedtime.   Yes [provider]  furosemide  (LASIX ) 40 MG tablet Take by mouth.  TAKE ONE TABLET BY MOUTH MONDAY, WEDNESDAY AND FRIDAY FOR FLUID 10/19/22  Yes [provider]  hydrOXYzine  (ATARAX ) 10 MG tablet Take 10 mg by mouth daily as needed. 05/20/22  Yes [provider]  ipratropium (ATROVENT) 0.06 % nasal spray Place 2 sprays into both nostrils daily as needed for rhinitis.   Yes [provider]  ketoconazole (NIZORAL) 2 % cream Apply 1 application topically daily. Apply 1 application to scales on sides of nose 09/11/19  Yes [provider]  levETIRAcetam  (KEPPRA ) 500 MG tablet Take 500 mg by mouth every morning. 1 tablet (500mg ) qam and 1/2 tablet (250mg ) in PM. Verified with pt's wife and using pill container on 06/01/22   Yes [provider]  lidocaine  (LIDODERM ) 5 % Place 1 patch onto the skin every 12 (twelve) hours. Remove & Discard patch within 12 hours or as directed by MD 09/26/22 09/26/23 Yes Lang Dover, MD  magnesium  hydroxide (MILK OF MAGNESIA) 400 MG/5ML suspension Take 30 mLs by mouth daily. 10/10/22  Yes Wouk, Devaughn Sayres, MD  melatonin 5 MG TABS Take 5 mg by mouth at bedtime as needed.   Yes [provider]  metoprolol  tartrate (LOPRESSOR ) 25 MG tablet Take 12.5 mg by mouth 2 (two) times daily.   Yes [provider]  mometasone -formoterol  (DULERA ) 200-5 MCG/ACT AERO Inhale 2 puffs into the lungs 2 (two) times daily. 10/10/22  Yes Wouk, Devaughn Sayres, MD  Multiple Vitamins-Minerals (MULTIVITAMIN WITH MINERALS) tablet Take 1 tablet by mouth daily.   Yes [provider]  polyethylene glycol (MIRALAX  / GLYCOLAX ) 17 g packet Take 17 g by mouth 2 (two) times daily. 10/10/22  Yes Wouk, Devaughn Sayres, MD  prazosin  (MINIPRESS ) 5 MG capsule Take 10 mg by mouth at bedtime.   Yes [provider]  primidone  (MYSOLINE ) 50 MG tablet Take 50 mg by mouth 2 (two) times daily. 05/23/22  Yes [provider]  tamsulosin  (FLOMAX ) 0.4 MG CAPS capsule Take 0.4 mg by mouth daily. bedtime   Yes [provider]  triamcinolone ointment (KENALOG) 0.1 % Apply 1 Application topically 2 (two) times daily. 12/03/11  Yes [provider]  venlafaxine  XR (EFFEXOR -XR) 150 MG 24 hr capsule Take 450 mg by mouth daily.   Yes [provider]  colchicine  0.6 MG tablet Take 1 tablet (0.6 mg total) by mouth 2 (two) times daily for 10 days. 08/20/22 08/30/22  Woods, Jaclyn M, PA-C  Ensure Max Protein (ENSURE MAX PROTEIN) LIQD Take 330 mLs (11 oz total) by mouth daily. 10/10/22   Wouk, Devaughn Sayres, MD  ferrous sulfate  325 (65 FE) MG EC tablet Take 1 tablet (325 mg total) by mouth daily with breakfast. 06/04/22 10/01/22  Zhang, Dekui, MD  ondansetron  (ZOFRAN ) 4 MG tablet Take 1 tablet (4 mg total) by mouth every 6 (six) hours as needed for  nausea. Patient not taking: Reported on 01/07/2023 10/10/22   Wouk, Devaughn Sayres, MD  oxyCODONE -acetaminophen  (PERCOCET) 5-325 MG tablet Take 1 tablet by mouth every 4 (four) hours as needed for severe pain. Patient not taking: Reported on 01/07/2023 10/10/22 10/10/23  Wouk, Devaughn Sayres, MD  predniSONE  (DELTASONE ) 10 MG tablet 4 tabs daily for 3 days, then 3 tabs daily for 3 days, then 2 tabs daily for 3 days, then 1 tab daily for 3 days Patient not taking: Reported on 01/07/2023 10/10/22   Wouk, Devaughn Sayres, MD  Spacer/Aero-Holding Chambers (AEROCHAMBER MV) inhaler Use as instructed 09/27/22   Bradler, Evan K, MD  torsemide  (DEMADEX ) 20 MG tablet Take 1 tablet (20 mg total) by mouth daily. Patient not taking: Reported on 01/07/2023 10/10/22   Wouk, Devaughn Sayres, MD  zolpidem  (AMBIEN ) 5 MG tablet Take 2.5 mg by mouth at bedtime as needed. Patient not taking: Reported on 01/07/2023 04/24/22   [provider]    Physical Exam: Vitals:   01/07/23  1630 01/07/23 1700 01/07/23 1845 01/07/23 2143  BP: (!) 142/86 139/83    Pulse: (!) 41 64    Resp: 16 16    Temp:   98.5 F (36.9 C) 99.5 F (37.5 C)  TempSrc:   Oral Oral  SpO2: 96% 92%    Weight:      Height:       Physical Exam Vitals and nursing note reviewed.  Constitutional:      General: He is not in acute distress. HENT:     Head: Normocephalic and atraumatic.  Cardiovascular:     Rate and Rhythm: Normal rate and regular rhythm.     Heart sounds: Normal heart sounds.  Pulmonary:     Effort: Pulmonary effort is normal.     Breath sounds: Normal breath sounds.  Abdominal:     Palpations: Abdomen is soft.     Tenderness: There is no abdominal tenderness.  Musculoskeletal:     Right lower leg: 2+ Edema present.     Left lower leg: 2+ Edema present.  Neurological:     Mental Status: Mental status is at baseline.     Labs on Admission: I have personally reviewed following labs and imaging studies  CBC: Recent Labs  Lab 01/07/23 1304  WBC 11.1*  HGB 13.2  HCT 43.2  MCV 84.0  PLT 101*   Basic Metabolic Panel: Recent Labs  Lab 01/07/23 1304  NA 132*  K 4.3  CL 95*  CO2 29  GLUCOSE 97  BUN 37*  CREATININE 1.03  CALCIUM  8.8*   GFR: Estimated Creatinine Clearance: 79.4 mL/min (by C-G formula based on SCr of 1.03 mg/dL). Liver Function Tests: Recent Labs  Lab 01/07/23 1304  AST 17  ALT 45*  ALKPHOS 59  BILITOT 0.4  PROT 8.9*  ALBUMIN 2.9*   No results for input(s): LIPASE, AMYLASE in the last 168 hours. No results for input(s): AMMONIA in the last 168 hours. Coagulation Profile: No results for input(s): INR, PROTIME in the last 168 hours. Cardiac Enzymes: No results for input(s): CKTOTAL, CKMB, CKMBINDEX, TROPONINI in the last 168 hours. BNP (last 3 results) No results for input(s): PROBNP in the last 8760 hours. HbA1C: No results for input(s): HGBA1C in the last 72 hours. CBG: No results for input(s): GLUCAP in  the last 168 hours. Lipid Profile: No results for input(s): CHOL, HDL, LDLCALC, TRIG, CHOLHDL, LDLDIRECT in the last 72 hours. Thyroid Function Tests: No results for input(s): TSH, T4TOTAL, FREET4,  T3FREE, THYROIDAB in the last 72 hours. Anemia Panel: No results for input(s): VITAMINB12, FOLATE, FERRITIN, TIBC, IRON , RETICCTPCT in the last 72 hours. Urine analysis:    Component Value Date/Time   COLORURINE YELLOW (A) 09/26/2022 1752   APPEARANCEUR CLEAR (A) 09/26/2022 1752   LABSPEC >1.046 (H) 09/26/2022 1752   PHURINE 5.0 09/26/2022 1752   GLUCOSEU NEGATIVE 09/26/2022 1752   HGBUR NEGATIVE 09/26/2022 1752   BILIRUBINUR NEGATIVE 09/26/2022 1752   KETONESUR NEGATIVE 09/26/2022 1752   PROTEINUR NEGATIVE 09/26/2022 1752   NITRITE NEGATIVE 09/26/2022 1752   LEUKOCYTESUR NEGATIVE 09/26/2022 1752    Radiological Exams on Admission: CT Angio Chest PE W and/or Wo Contrast Result Date: 01/07/2023 CLINICAL DATA:  Pulmonary embolism (PE) suspected, low to intermediate prob, positive D-dimer. Seizure. EXAM: CT ANGIOGRAPHY CHEST WITH CONTRAST TECHNIQUE: Multidetector CT imaging of the chest was performed using the standard protocol during bolus administration of intravenous contrast. Multiplanar CT image reconstructions and MIPs were obtained to evaluate the vascular anatomy. RADIATION DOSE REDUCTION: This exam was performed according to the departmental dose-optimization program which includes automated exposure control, adjustment of the mA and/or kV according to patient size and/or use of iterative reconstruction technique. CONTRAST:  75mL OMNIPAQUE  IOHEXOL  350 MG/ML SOLN COMPARISON:  CT angiography chest from 10/01/2022. FINDINGS: Cardiovascular: No evidence of embolism to the proximal subsegmental pulmonary artery level. Mild cardiomegaly. No pericardial effusion. No aortic aneurysm. There are coronary artery calcifications, in keeping with coronary artery disease.  There are also mild peripheral atherosclerotic vascular calcifications of thoracic aorta and its major branches. 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 There are moderate mitral annulus calcifications. Mediastinum/Nodes: Visualized thyroid gland appears grossly unremarkable. No solid / cystic mediastinal masses. The esophagus is nondistended precluding optimal assessment. No axillary, mediastinal or hilar lymphadenopathy by size criteria. Lungs/Pleura: The central tracheo-bronchial tree is patent. There are subsegmental atelectatic changes throughout bilateral lungs with lower lobe predominance. There are trace bilateral pleural effusions. No consolidation or mass. No pneumothorax. There is a stable 6 x 9 mm oval noncalcified opacity in the left major fissure which is unchanged since the prior study from 05/31/2022. No new or suspicious lung nodule. Upper Abdomen: There is a small sliding hiatal hernia. There are at least 3, sub 4 mm, non obstructing calculi in the right kidney and at least 2, sub 4 mm nonobstructing calculi in the left kidney. Remaining visualized upper abdominal viscera within normal limits. Musculoskeletal: The visualized soft tissues of the chest wall are grossly unremarkable. No suspicious osseous lesions. There are mild to moderate multilevel degenerative changes in the visualized spine. Review of the MIP images confirms the above findings. IMPRESSION: 1. No embolism to the proximal subsegmental pulmonary artery level. 2. Redemonstration of subsegmental atelectatic changes in bilateral lungs. There are bilateral trace pleural effusions. No lung mass, consolidation or pneumothorax. No new or suspicious lung nodule. 3. Multiple other nonacute observations, as described above. Aortic Atherosclerosis (ICD10-I70.0). Electronically Signed   By: Ree Molt M.D.   On: 01/07/2023 16:33   CT HEAD WO CONTRAST ( ) Result Date: 01/07/2023 CLINICAL DATA:  Syncope/presyncope, cerebrovascular cause suspected.  EXAM: CT HEAD WITHOUT CONTRAST TECHNIQUE: Contiguous axial images were obtained from the base of the skull through the vertex without intravenous contrast. RADIATION DOSE REDUCTION: This exam was performed according to the departmental dose-optimization program which includes automated exposure control, adjustment of the mA and/or kV according to patient size and/or use of iterative reconstruction technique. COMPARISON:  10/02/2022. FINDINGS: Brain: No evidence of acute infarction, hemorrhage,  hydrocephalus, extra-axial collection or mass lesion/mass effect. There is bilateral periventricular hypodensity, which is non-specific but most likely seen in the settings of microvascular ischemic changes. Mild in extent. Otherwise normal appearance of brain parenchyma. Ventricles are normal. Cerebral volume is age appropriate. Vascular: No hyperdense vessel or unexpected calcification. Intracranial arteriosclerosis. Skull: Normal. Negative for fracture or focal lesion. Sinuses/Orbits: No acute finding. Other: Visualized mastoid air cells are unremarkable. Trace right mastoid effusion. Unremarkable left mastoid air cells. IMPRESSION: *No acute intracranial abnormality. Electronically Signed   By: Ree Molt M.D.   On: 01/07/2023 12:59   DG Chest 2 View Result Date: 01/07/2023 CLINICAL DATA:  Low oxygen saturation. EXAM: CHEST - 2 VIEW COMPARISON:  10/01/2022. FINDINGS: Redemonstration of left middle lateral chest wall and right lower lateral chest wall pleural thickening, similar to the prior studies. There are also peripheral linear opacities overlying the left mid lower lung zones, favored to represent underlying fibrosis/atelectasis, similar to the prior study. Bilateral lung fields are otherwise clear. No acute consolidation or lung collapse. No frank pulmonary edema. There are persistent bilateral trace pleural effusions. Note is made of elevated right hemidiaphragm. Stable cardio-mediastinal silhouette.  Sternotomy wires and prosthetic aortic valve noted. No acute osseous abnormalities. The soft tissues are within normal limits. IMPRESSION: *Essentially stable exam.  No acute cardiopulmonary abnormality. Electronically Signed   By: Ree Molt M.D.   On: 01/07/2023 12:56     Data Reviewed: Relevant notes from primary care and specialist visits, past discharge summaries as available in EHR, including Care Everywhere. Prior diagnostic testing as pertinent to current admission diagnoses Updated medications and problem lists for reconciliation ED course, including vitals, labs, imaging, treatment and response to treatment Triage notes, nursing and pharmacy notes and ED provider's notes Notable results as noted in HPI   Assessment and Plan: * Syncope and collapse History of tremors and frequent falls History of carotid artery stenosis Last seen by neurology August 2024-had EEG May 2024 that showed generalized slowing Continue Keppra  and primidone  Get Keppra  level Syncope workup:continuous cardiac monitoring, carotid Doppler.  Had echo in October 2024, EF 60 to 65% with G1 DD Orthostatic vital signs Decreased sedating meds if possible PT and TOC eval  History of Seizure-like activity (HCC) Management as above Keppra  load and continue home Keppra  and primidone  Will get repeat EEG Neurology consult for additional recommendations   OSA on CPAP Hypoxia Patient had hypoxia last couple hospitalizations CTA chest negative for PE or other acute finding CPAP nightly Supplemental oxygen and wean as tolerated Consider Home O2 eval prior to discharge  CAD (coronary artery disease) No complaints of chest pain and EKG nonacute, troponin normal Continue metoprolol , atorvastatin  and aspirin   Gout Continue allopurinol   Hyponatremia Chronic.  Sodium 132  Multiple myeloma (HCC) Chronic thrombocytopenia No acute issues suspected  Benign prostatic hyperplasia without lower urinary tract  symptoms Continue tamsulosin  and finasteride   Dementia without behavioral disturbance (HCC) Depression/PTSD Delirium precautions Continue venlafaxine      DVT prophylaxis: Lovenox   Consults: neurology  Advance Care Planning:   Code Status: Prior   Family Communication: none  Disposition Plan: Back to previous home environment  Severity of Illness: The appropriate patient status for this patient is OBSERVATION. Observation status is judged to be reasonable and necessary in order to provide the required intensity of service to ensure the patient's safety. The patient's presenting symptoms, physical exam findings, and initial radiographic and laboratory data in the context of their medical condition is felt to place them at  decreased risk for further clinical deterioration. Furthermore, it is anticipated that the patient will be medically stable for discharge from the hospital within 2 midnights of admission.   Author: Delayne LULLA Solian, MD 01/07/2023 9:49 PM  For on call review [email or web address].

## 2023-01-07 NOTE — Assessment & Plan Note (Signed)
 No complaints of chest pain and EKG nonacute, troponin normal Continue metoprolol, atorvastatin and aspirin

## 2023-01-07 NOTE — Assessment & Plan Note (Signed)
 Continue tamsulosin and finasteride

## 2023-01-08 DIAGNOSIS — R55 Syncope and collapse: Secondary | ICD-10-CM | POA: Diagnosis not present

## 2023-01-08 LAB — BASIC METABOLIC PANEL
Anion gap: 9 (ref 5–15)
BUN: 22 mg/dL (ref 8–23)
CO2: 26 mmol/L (ref 22–32)
Calcium: 8.4 mg/dL — ABNORMAL LOW (ref 8.9–10.3)
Chloride: 96 mmol/L — ABNORMAL LOW (ref 98–111)
Creatinine, Ser: 0.82 mg/dL (ref 0.61–1.24)
GFR, Estimated: >60 mL/min (ref >60)
Glucose, Bld: 120 mg/dL — ABNORMAL HIGH (ref 70–99)
Potassium: 3.9 mmol/L (ref 3.5–5.1)
Sodium: 131 mmol/L — ABNORMAL LOW (ref 135–145)

## 2023-01-08 LAB — CBC
HCT: 37.5 % — ABNORMAL LOW (ref 39.0–52.0)
Hemoglobin: 11.6 g/dL — ABNORMAL LOW (ref 13.0–17.0)
MCH: 25.3 pg — ABNORMAL LOW (ref 26.0–34.0)
MCHC: 30.9 g/dL (ref 30.0–36.0)
MCV: 81.9 fL (ref 80.0–100.0)
Platelets: 99 10*3/uL — ABNORMAL LOW (ref 150–400)
RBC: 4.58 MIL/uL (ref 4.22–5.81)
RDW: 19.9 % — ABNORMAL HIGH (ref 11.5–15.5)
WBC: 9.4 10*3/uL (ref 4.0–10.5)
nRBC: 0 % (ref 0.0–0.2)

## 2023-01-08 MED ORDER — LEVETIRACETAM 250 MG PO TABS: 250.0000 mg | ORAL_TABLET | Freq: Every day | ORAL | Status: DC

## 2023-01-08 NOTE — Consult Note (Signed)
 NEUROLOGY CONSULT NOTE   Date of service: January 08, 2023 Patient Name: Alex Gomez MRN:  [id number] DOB:  12-07-1945 Chief Complaint: Syncope versus seizure Requesting Provider: Kandis Devaughn Sayres, MD  History of Present Illness  78 y.o. male with a PMHx significant for multiple myeloma, HTN, CAD, AVS status post bioprosthetic AVR, bilateral carotid stenosis, HLD, dementia, OSA on CPAP, seizure-like activity/tremors resulting in falls, on Keppra  since 01/2020 (last seen OP neuro 08/2022) who presents to the ED following an episode of unresponsiveness at home. The episode occurred while he was sitting watching TV. He had jerking motions and apnea for 2 minutes. Wife stated that he turned blue during the episode. Wife says she saw him earlier then looked looked back and the patient was blue and not responsive. Wife came over and did CPR for about 2 minutes. He the then started breathing spontaneously again. EMS was called. Upon EMS arrival, the patient was awake and alert. EMS noted him to be hypoxic and placed him on nasal cannula. He was previously in his usual state of health.   In the ED, wife reported that she felt the patient was postictal, but no urinary incontinece was noted. Initial DDx after evaluation by Hospitalist was syncopal episode versus seizure. His last seizure episode was 2 months ago. Of note, the patient is taking multiple psychotropic medications at home, including hydroxyzine , melatonin, tramadol , zolpidem  and buspirone . While in the ED the patient's O2 sat dropped to 88% twice while walking and he was placed on O2 at 2 L to maintain sats 94 to 95%.  Chart review reveals that the patient was hospitalized from 9/30 to 10/10/2022 for hypoxic respiratory failure attributed to COPD. He was also hypoxic during a prior hospitalization in May 2024. On both occasions he was ruled out for PE.  ED course:  WBC 11,000 and platelets 101,000 CMP with mild electrolyte abnormalities Troponin  12 and BNP 102 D-dimer 1.07. EKG NSR at 75 with no ischemic ST-T wave changes. CTA PE protocol negative for acute PE or other acute finding CT head with no acute abnormalities    ROS  Comprehensive ROS performed and pertinent positives documented in HPI    Past History   Past Medical History:  Diagnosis Date   COPD (chronic obstructive pulmonary disease) (HCC)    Hypercholesterolemia    Seizures (HCC)     No past surgical history on file.  Family History: No family history on file.  Social History  reports that he quit smoking about 26 years ago. His smoking use included cigarettes. He started smoking about 56 years ago. He has a 30 pack-year smoking history. He has never used smokeless tobacco. He reports that he does not currently use alcohol . He reports that he does not use drugs.  Allergies  Allergen Reactions   Shellfish Allergy Shortness Of Breath    Medications   Current Facility-Administered Medications:    acetaminophen  (TYLENOL ) tablet 650 mg, 650 mg, Oral, Q4H PRN **OR** acetaminophen  (TYLENOL ) suppository 650 mg, 650 mg, Rectal, Q4H PRN, Cleatus Delayne GAILS, MD   albuterol  (PROVENTIL ) (2.5 MG/3ML) 0.083% nebulizer solution 2.5 mg, 2.5 mg, Inhalation, Q6H PRN, Cleatus Delayne GAILS, MD   allopurinol  (ZYLOPRIM ) tablet 300 mg, 300 mg, Oral, Daily, Cleatus Delayne GAILS, MD   aspirin  EC tablet 81 mg, 81 mg, Oral, Daily, Cleatus Delayne V, MD   atorvastatin  (LIPITOR ) tablet 40 mg, 40 mg, Oral, Daily, Cleatus Delayne V, MD   enoxaparin  (LOVENOX ) injection 52.5 mg, 0.5 mg/kg,  Subcutaneous, Q24H, Hunt, Madison H, RPH, 52.5 mg at 01/07/23 2244   finasteride  (PROSCAR ) tablet 5 mg, 5 mg, Oral, Daily, Cleatus Hoof V, MD, 5 mg at 01/07/23 2240   levETIRAcetam  (KEPPRA ) tablet 500 mg, 500 mg, Oral, q morning, Duncan, Hazel V, MD   LORazepam  (ATIVAN ) injection 2 mg, 2 mg, Intravenous, Q5 Min x 2 PRN, Cleatus Hoof GAILS, MD   melatonin tablet 5 mg, 5 mg, Oral, QHS PRN, Cleatus Hoof GAILS, MD    metoprolol  tartrate (LOPRESSOR ) tablet 12.5 mg, 12.5 mg, Oral, BID, Cleatus Hoof GAILS, MD, 12.5 mg at 01/07/23 2236   mometasone -formoterol  (DULERA ) 200-5 MCG/ACT inhaler 2 puff, 2 puff, Inhalation, BID, Cleatus Hoof GAILS, MD, 2 puff at 01/07/23 2240   ondansetron  (ZOFRAN ) tablet 4 mg, 4 mg, Oral, Q6H PRN **OR** ondansetron  (ZOFRAN ) injection 4 mg, 4 mg, Intravenous, Q6H PRN, Cleatus Hoof GAILS, MD   Oral care mouth rinse, 15 mL, Mouth Rinse, Q2H, Cleatus Hoof GAILS, MD, 15 mL at 01/07/23 2312   Oral care mouth rinse, 15 mL, Mouth Rinse, PRN, Cleatus Hoof GAILS, MD   primidone  (MYSOLINE ) tablet 50 mg, 50 mg, Oral, BID, Cleatus Hoof GAILS, MD, 50 mg at 01/07/23 2239   sodium chloride  flush (NS) 0.9 % injection 3 mL, 3 mL, Intravenous, Q12H, Cleatus Hoof GAILS, MD, 3 mL at 01/07/23 2243   tamsulosin  (FLOMAX ) capsule 0.4 mg, 0.4 mg, Oral, Daily, Cleatus Hoof V, MD, 0.4 mg at 01/07/23 2236   venlafaxine  XR (EFFEXOR -XR) 24 hr capsule 450 mg, 450 mg, Oral, Daily, Cleatus Hoof GAILS, MD  Current Outpatient Medications:    acetaminophen  (TYLENOL ) 325 MG tablet, Take 2 tablets (650 mg total) by mouth every 6 (six) hours as needed for mild pain (or Fever >/= 101)., Disp: , Rfl:    albuterol  (VENTOLIN  HFA) 108 (90 Base) MCG/ACT inhaler, Inhale 2 puffs into the lungs every 6 (six) hours as needed for wheezing or shortness of breath., Disp: 8 g, Rfl: 2   allopurinol  (ZYLOPRIM ) 300 MG tablet, Take 300 mg by mouth daily., Disp: , Rfl:    aspirin  EC 81 MG tablet, Take 81 mg by mouth daily., Disp: , Rfl:    atorvastatin  (LIPITOR ) 40 MG tablet, Take 40 mg by mouth daily., Disp: , Rfl:    bisacodyl  (DULCOLAX) 10 MG suppository, Place 1 suppository (10 mg total) rectally daily as needed for moderate constipation., Disp: , Rfl:    carboxymethylcellulose (REFRESH PLUS) 0.5 % SOLN, 1 drop 3 (three) times daily as needed., Disp: , Rfl:    Cholecalciferol 25 MCG (1000 UT) tablet, Take 1,000 Units by mouth daily., Disp: , Rfl:    clobetasol  cream (TEMOVATE) 0.05 %, Apply 1 Application topically 2 (two) times daily., Disp: , Rfl:    coal tar (NEUTROGENA T-GEL) 0.5 % shampoo, Apply 1 Application topically at bedtime as needed., Disp: , Rfl:    cyanocobalamin  (VITAMIN B12) 1000 MCG tablet, Take 1 tablet (1,000 mcg total) by mouth daily., Disp: 30 tablet, Rfl: 0   desonide (DESOWEN) 0.05 % cream, Apply 1 Application topically 2 (two) times daily., Disp: , Rfl:    finasteride  (PROSCAR ) 5 MG tablet, Take 5 mg by mouth at bedtime., Disp: , Rfl:    furosemide  (LASIX ) 40 MG tablet, Take by mouth.  TAKE ONE TABLET BY MOUTH MONDAY, WEDNESDAY AND FRIDAY FOR FLUID, Disp: , Rfl:    hydrOXYzine  (ATARAX ) 10 MG tablet, Take 10 mg by mouth daily as needed., Disp: , Rfl:    ipratropium (  ATROVENT) 0.06 % nasal spray, Place 2 sprays into both nostrils daily as needed for rhinitis., Disp: , Rfl:    ketoconazole (NIZORAL) 2 % cream, Apply 1 application topically daily. Apply 1 application to scales on sides of nose, Disp: , Rfl:    levETIRAcetam  (KEPPRA ) 500 MG tablet, Take 500 mg by mouth every morning. 1 tablet (500mg ) qam and 1/2 tablet (250mg ) in PM. Verified with pt's wife and using pill container on 06/01/22, Disp: , Rfl:    lidocaine  (LIDODERM ) 5 %, Place 1 patch onto the skin every 12 (twelve) hours. Remove & Discard patch within 12 hours or as directed by MD, Disp: 10 patch, Rfl: 0   magnesium  hydroxide (MILK OF MAGNESIA) 400 MG/5ML suspension, Take 30 mLs by mouth daily., Disp: , Rfl:    melatonin 5 MG TABS, Take 5 mg by mouth at bedtime as needed., Disp: , Rfl:    metoprolol  tartrate (LOPRESSOR ) 25 MG tablet, Take 12.5 mg by mouth 2 (two) times daily., Disp: , Rfl:    mometasone -formoterol  (DULERA ) 200-5 MCG/ACT AERO, Inhale 2 puffs into the lungs 2 (two) times daily., Disp: , Rfl:    Multiple Vitamins-Minerals (MULTIVITAMIN WITH MINERALS) tablet, Take 1 tablet by mouth daily., Disp: , Rfl:    polyethylene glycol (MIRALAX  / GLYCOLAX ) 17 g packet,  Take 17 g by mouth 2 (two) times daily., Disp: , Rfl:    prazosin  (MINIPRESS ) 5 MG capsule, Take 10 mg by mouth at bedtime., Disp: , Rfl:    primidone  (MYSOLINE ) 50 MG tablet, Take 50 mg by mouth 2 (two) times daily., Disp: , Rfl:    tamsulosin  (FLOMAX ) 0.4 MG CAPS capsule, Take 0.4 mg by mouth daily. bedtime, Disp: , Rfl:    triamcinolone ointment (KENALOG) 0.1 %, Apply 1 Application topically 2 (two) times daily., Disp: , Rfl:    venlafaxine  XR (EFFEXOR -XR) 150 MG 24 hr capsule, Take 450 mg by mouth daily., Disp: , Rfl:    colchicine  0.6 MG tablet, Take 1 tablet (0.6 mg total) by mouth 2 (two) times daily for 10 days., Disp: 20 tablet, Rfl: 0   Ensure Max Protein (ENSURE MAX PROTEIN) LIQD, Take 330 mLs (11 oz total) by mouth daily., Disp: , Rfl:    ferrous sulfate  325 (65 FE) MG EC tablet, Take 1 tablet (325 mg total) by mouth daily with breakfast., Disp: 30 tablet, Rfl: 0   ondansetron  (ZOFRAN ) 4 MG tablet, Take 1 tablet (4 mg total) by mouth every 6 (six) hours as needed for nausea. (Patient not taking: Reported on 01/07/2023), Disp: , Rfl:    oxyCODONE -acetaminophen  (PERCOCET) 5-325 MG tablet, Take 1 tablet by mouth every 4 (four) hours as needed for severe pain. (Patient not taking: Reported on 01/07/2023), Disp: 8 tablet, Rfl: 0   predniSONE  (DELTASONE ) 10 MG tablet, 4 tabs daily for 3 days, then 3 tabs daily for 3 days, then 2 tabs daily for 3 days, then 1 tab daily for 3 days (Patient not taking: Reported on 01/07/2023), Disp: , Rfl:    Spacer/Aero-Holding Chambers (AEROCHAMBER MV) inhaler, Use as instructed, Disp: 1 each, Rfl: 0   torsemide  (DEMADEX ) 20 MG tablet, Take 1 tablet (20 mg total) by mouth daily. (Patient not taking: Reported on 01/07/2023), Disp: , Rfl:    zolpidem  (AMBIEN ) 5 MG tablet, Take 2.5 mg by mouth at bedtime as needed. (Patient not taking: Reported on 01/07/2023), Disp: , Rfl:   Vitals   Vitals:   01/07/23 2237 01/07/23 2300 01/07/23 2330 01/08/23 0759  BP: 111/70 (!) 145/73  133/72 136/75  Pulse: 83 63 70 76  Resp: (!) 22 18 13 18   Temp:    97.7 F (36.5 C)  TempSrc:    Oral  SpO2: 95% 97% 99% 94%  Weight:      Height:        Body mass index is 31 kg/m.  Physical Exam   Physical Exam HEENT- [place]/AT   Lungs- Respirations unlabored Extremities- Warm and well-perfused  Neurological Examination Mental Status: Awake and alert. Fully oriented. Thought content appropriate.  Speech fluent without evidence of aphasia.  Able to follow all commands without difficulty.  Cranial Nerves: II: Temporal visual fields intact with no extinction to DSS. PERRL. III,IV, VI: No ptosis. EOMI. No nystagmus. V: Temp sensation equal bilaterally VII: Smile symmetric VIII: Hearing intact to voice IX,X: No hypophonia or hoarseness XI: Symmetric XII: Midline tongue extension Motor: RUE: 5/5 LUE: 5/5 RLE: 5/5 LLE: 5/5 Sensory: Temp and FT intact x 4. No extinction to DSS. Deep Tendon Reflexes: 2+ and symmetric bilateral biceps, brachioradialis and patellae Cerebellar: No ataxia with FNF bilaterally Gait: Deferred   Labs/Imaging/Neurodiagnostic studies   CBC:  Recent Labs  Lab Jan 27, 2023 1304 01/08/23 0550  WBC 11.1* 9.4  HGB 13.2 11.6*  HCT 43.2 37.5*  MCV 84.0 81.9  PLT 101* 99*   Basic Metabolic Panel:  Lab Results  Component Value Date   NA 131 (L) 01/08/2023   K 3.9 01/08/2023   CO2 26 01/08/2023   GLUCOSE 120 (H) 01/08/2023   BUN 22 01/08/2023   CREATININE 0.82 01/08/2023   CALCIUM  8.4 (L) 01/08/2023   GFRNONAA >60 01/08/2023   Lipid Panel: No results found for: LDLCALC HgbA1c:  Lab Results  Component Value Date   HGBA1C 7.5 (H) 10/02/2022   Urine Drug Screen:     Component Value Date/Time   LABOPIA NONE DETECTED 01/29/2020 1840   COCAINSCRNUR NONE DETECTED 01/29/2020 1840   LABBENZ NONE DETECTED 01/29/2020 1840   AMPHETMU NONE DETECTED 01/29/2020 1840   THCU NONE DETECTED 01/29/2020 1840   LABBARB NONE DETECTED 01/29/2020 1840     Alcohol  Level No results found for: [facility] INR  Lab Results  Component Value Date   INR 1.2 06/01/2022   APTT  Lab Results  Component Value Date   APTT 31 06/01/2022     ASSESSMENT  78 year old male with and history of hypoxic respiratory failure and seizures, presenting after an episode of unresponsiveness and hypoxia at home, associated with jerking movements. Received supplemental IV Keppra  loading dose of 500 mg yesterday evening.  - Exam reveals no focal neurological deficits - CT head: No evidence of acute infarction, hemorrhage, hydrocephalus, extra-axial collection or mass lesion/mass effect. There is bilateral periventricular hypodensity, which is non-specific but most likely seen in the settings of microvascular ischemic changes. Mild in extent. Otherwise normal appearance of brain parenchyma. - DDx for presentation includes atypical seizure (on further interview it is revealed that he was in an eyes-open, semiresponsive state during the episode of unresponsiveness at home) versus hypoxia with AMS versus presyncopal/hypotensive state due to possible arrhyrthmia.   RECOMMENDATIONS  - Continue Keppra  500 mg QAM, 250 mg QPM - Outpatient EEG - May need Zio patch or other long term cardiac monitoring to assess for possible arrhythmia during his spells, which wife states occur about once per month on average.  - Orthostatics - Limit sedating/psychotropic medications, as you are doing - Inpatient seizure precautions - Keppra  level (ordered) - Evaluation  and management of respiratory abnormalities per primary team - Outpatient Neurology follow up.   ______________________________________________________________________    Bonney SHARK, Glady Ouderkirk, MD [facility]

## 2023-01-08 NOTE — ED Notes (Signed)
 CPAP initiated by respiratory at this time

## 2023-01-08 NOTE — Evaluation (Signed)
 Physical Therapy Evaluation Patient Details Name: GUHAN BRUINGTON MRN: [id number] DOB: 1945-05-28 Today's Date: 01/08/2023  History of Present Illness  78 y/o male presented to ED on 01/06/22 for jerking motions and apnea x 2 minutes. PMH: hx of seizures, HTN, multiple myeloma, CAD, dementia, AVS s/p biprosthetic AVR, OSA on CPAP  Clinical Impression  Patient admitted with the above. PTA, patient lives with wife who provides assistance with ADLs and supervision for mobility. Patient typically ambulates with no AD and is currently active with OPP[facility] at [facility]. Has an aide 18 hours/week. Patient presents with weakness, impaired balance, and decreased activity tolerance. Patient required modA for bed mobility and CGA for sit to stand. Ambulated 50' with no AD and CGA with no LOB noted. Patient will benefit from skilled PT services during acute stay to address listed deficits. No follow up therapy recommended as patient is already active with OPPT. Wife wanting to take him home this date and will closely monitor patient.       If plan is discharge home, recommend the following: Supervision due to cognitive status;A little help with bathing/dressing/bathroom;Direct supervision/assist for medications management;Direct supervision/assist for financial management   Can travel by private vehicle        Equipment Recommendations None recommended by PT  Recommendations for Other Services       Functional Status Assessment Patient has had a recent decline in their functional status and demonstrates the ability to make significant improvements in function in a reasonable and predictable amount of time.     Precautions / Restrictions Precautions Precautions: Fall Restrictions Weight Bearing Restrictions Per Provider Order: No      Mobility  Bed Mobility Overal bed mobility: Needs Assistance Bed Mobility: Supine to Sit, Sit to Supine     Supine to sit: Mod assist Sit to supine: Contact guard  assist        Transfers Overall transfer level: Needs assistance Equipment used: None Transfers: Sit to/from Stand Sit to Stand: Contact guard assist                Ambulation/Gait Ambulation/Gait assistance: Contact guard assist Gait Distance (Feet): 70 Feet Assistive device: None Gait Pattern/deviations: Step-through pattern, Decreased stride length, Trunk flexed Gait velocity: decreased     General Gait Details: CGA for safety. No LOB noted.  Stairs            Wheelchair Mobility     Tilt Bed    Modified Rankin (Stroke Patients Only)       Balance Overall balance assessment: Needs assistance, History of Falls Sitting-balance support: No upper extremity supported, Feet supported Sitting balance-Leahy Scale: Good     Standing balance support: No upper extremity supported, During functional activity Standing balance-Leahy Scale: Fair                               Pertinent Vitals/Pain Pain Assessment Pain Assessment: No/denies pain    Home Living Family/patient expects to be discharged to:: Private residence Living Arrangements: Spouse/significant other Available Help at Discharge: Family;Available 24 hours/day;Personal care attendant Type of Home: House Home Access: Stairs to enter Entrance Stairs-Rails: Can reach both Entrance Stairs-Number of Steps: 3 Alternate Level Stairs-Number of Steps: stair lift Home Layout: Two level;Bed/bath upstairs Home Equipment: Cane - single point;Shower seat;Standard Building Surveyor (2 wheels) Additional Comments: Has aide 18 hours/week, active with OPPT at [facility] for balance and strengthening    Prior Function Prior Level of  Function : Needs assist;History of Falls (last six months)             Mobility Comments: OPPT working on balance and strength. No AD utilized ADLs Comments: wife assist with ADLs/IADLs     Extremity/Trunk Assessment   Upper Extremity Assessment Upper  Extremity Assessment: Overall WFL for tasks assessed    Lower Extremity Assessment Lower Extremity Assessment: Generalized weakness    Cervical / Trunk Assessment Cervical / Trunk Assessment: Kyphotic  Communication   Communication Communication: Hearing impairment  Cognition Arousal: Alert Behavior During Therapy: WFL for tasks assessed/performed Overall Cognitive Status: History of cognitive impairments - at baseline                                          General Comments      Exercises     Assessment/Plan    PT Assessment Patient needs continued PT services  PT Problem List Decreased strength;Decreased activity tolerance;Decreased balance;Decreased mobility;Decreased cognition;Decreased knowledge of use of DME;Decreased knowledge of precautions;Decreased safety awareness       PT Treatment Interventions DME instruction;Stair training;Gait training;Functional mobility training;Therapeutic activities;Balance training;Therapeutic exercise;Patient/family education    PT Goals (Current goals can be found in the Care Plan section)  Acute Rehab PT Goals Patient Stated Goal: to go home soon PT Goal Formulation: With family Time For Goal Achievement: 01/22/23 Potential to Achieve Goals: Good    Frequency Min 1X/week     Co-evaluation               AM-PAC PT 6 Clicks Mobility  Outcome Measure Help needed turning from your back to your side while in a flat bed without using bedrails?: A Little Help needed moving from lying on your back to sitting on the side of a flat bed without using bedrails?: A Little Help needed moving to and from a bed to a chair (including a wheelchair)?: A Little Help needed standing up from a chair using your arms (e.g., wheelchair or bedside chair)?: A Little Help needed to walk in hospital room?: A Little Help needed climbing 3-5 steps with a railing? : A Little 6 Click Score: 18    End of Session   Activity  Tolerance: Patient tolerated treatment well Patient left: in bed;with call bell/phone within reach;with family/visitor present Nurse Communication: Mobility status PT Visit Diagnosis: Unsteadiness on feet (R26.81);Muscle weakness (generalized) (M62.81)    Time: 9068-9054 PT Time Calculation (min) (ACUTE ONLY): 14 min   Charges:   PT Evaluation $PT Eval Low Complexity: 1 Low   PT General Charges $$ ACUTE PT VISIT: 1 Visit         Maryanne Finder, PT, DPT Physical Therapist - [facility]   Jalyn Rosero A Najia Hurlbutt 01/08/2023, 9:53 AM

## 2023-01-08 NOTE — ED Notes (Signed)
 Pt continues to yell out and is redirectable when at bedside but continues to yell after leaving bedside. Pt repositioned in bed and cleansed of urinary incontinence.

## 2023-01-08 NOTE — ED Notes (Addendum)
 Pt repeatedly yelling nurse and asking this RN to stay in room because pt does not want to be alone. Pt repeatedly stating don't leave me. Pt oriented to person, place, and time. Pt disoriented to situation. This RN had similar experience with pt when here previously. Pt has personal cell phone in hand and has repeatedly been calling spouse. This RN attempted to call spouse per pt request with no answer when called. Bed fall alarm remains in place and toileting offered to pt. Pt moved into hall as to be closer to nursing station.

## 2023-01-08 NOTE — TOC CM/SW Note (Signed)
 Transition of Care [facility]) - Inpatient Brief Assessment   Patient Details  Name: Alex Gomez MRN: [id number] Date of Birth: 1945-08-19  Transition of Care [facility]) CM/SW Contact:    Lauraine JAYSON Carpen, LCSW Phone Number: 01/08/2023, 11:47 AM   Clinical Narrative: CSW acknowledges consult for home health/DME needs. PT has evaluated patient and determined no follow up needs and no DME recommendations. CSW reviewed chart. No other TOC needs identified. Please place another [facility] consult if any needs arise.  Transition of Care Asessment: Insurance and Status: Insurance coverage has been reviewed Patient has primary care physician: Yes Home environment has been reviewed: Single family home Prior level of function:: Needs assist Prior/Current Home Services: No current home services Social Drivers of Health Review: SDOH reviewed no interventions necessary Readmission risk has been reviewed: Yes Transition of care needs: no transition of care needs at this time

## 2023-01-08 NOTE — ED Notes (Signed)
 Provided patient with warm blanket

## 2023-01-08 NOTE — ED Notes (Signed)
 Wife at bedside requesting that patient be discharged home, she plans on monitoring him at home and will follow up with his team of doctors. MD Wouk made aware.

## 2023-01-08 NOTE — Discharge Summary (Signed)
 Alex Gomez FMW:[id number] DOB: [birth year] DOA: 01/07/2023  PCP: [facility]  Admit date: 01/07/2023 Discharge date: 01/08/2023  Time spent: 35  minutes  Recommendations for Outpatient Follow-up:  Pcp f/u Neurology f/u  Cardiology f/u     Discharge Diagnoses:  Principal Problem:   Syncope and collapse Active Problems:   History of Seizure-like activity (HCC)   Frequent falls   Tremor   OSA on CPAP   CAD (coronary artery disease)   Benign essential hypertension   Type 2 diabetes mellitus without complication (HCC)   Gout   Thrombocytopenia (HCC)   Dementia without behavioral disturbance (HCC)   Hypoxia   Benign prostatic hyperplasia without lower urinary tract symptoms   Multiple myeloma (HCC)   Hyponatremia   Obesity (BMI 30-39.9)   Discharge Condition: stable  Diet recommendation: heart healthy  Filed Weights   01/07/23 1200  Weight: 106.6 kg    History of present illness:  From admission h and p Alex Gomez is a 78 y.o. male with medical history significant for Multiple myeloma, HTN, CAD, AVS status post bioprosthetic AVR, bilateral carotid stenosis, HLD, dementia, OSA on CPAP, seizure-like activity/tremors resulting in falls, on Keppra  since 01/2020 (last seen OP neuro 08/2022)who presents to the ED following a syncopal episode versus seizure.  Patient apparently fell and appeared unresponsive and his wife initiated CPR and called EMS.  Upon their arrival patient was awake and alert.  He was previously in his usual state of health. Of note, patient was hospitalized from 9/30 to 10/10/2022 for hypoxic respiratory failure attributed to COPD .  Was also hypoxic during her hospitalization in May 2024.  On both occasions ruled out for PE.  Hospital Course:  Patient presents with a transient episode of unresponsiveness. No syncope. No clear seizure symptoms. Promptly returned to his baseline. No significant lab abnormalities, rhythm disturtance. Nothing acute on  CT of head or CTA of chest. Evaluated by neurology, keppra  level obtained. Precise etiology of this event unclear. Patient feeling back to baseline and wife requests discharge home. Neurology advises close outpt neurology f/u, keppra  level should result by then and keppra  dose can be adjusted as needed. Limit psychotropic medications. Follow-up with cardiology and consider event monitor.   Procedures: none   Consultations: neurology  Discharge Exam: Vitals:   01/08/23 0759 01/08/23 1100  BP: 136/75 130/69  Pulse: 76 74  Resp: 18 18  Temp: 97.7 F (36.5 C) (!) 97.5 F (36.4 C)  SpO2: 94% 95%    General: NAD Cardiovascular: RRR Respiratory: CTAB  Discharge Instructions   Discharge Instructions     Diet - low sodium heart healthy   Complete by: As directed    Increase activity slowly   Complete by: As directed       Allergies as of 01/08/2023       Reactions   Shellfish Allergy Shortness Of Breath        Medication List     STOP taking these medications    ondansetron  4 MG tablet Commonly known as: ZOFRAN    oxyCODONE -acetaminophen  5-325 MG tablet Commonly known as: Percocet   predniSONE  10 MG tablet Commonly known as: DELTASONE    torsemide  20 MG tablet Commonly known as: DEMADEX    zolpidem  5 MG tablet Commonly known as: AMBIEN        TAKE these medications    acetaminophen  325 MG tablet Commonly known as: TYLENOL  Take 2 tablets (650 mg total) by mouth every 6 (six) hours as needed for mild  pain (or Fever >/= 101).   AeroChamber MV inhaler Use as instructed   albuterol  108 (90 Base) MCG/ACT inhaler Commonly known as: VENTOLIN  HFA Inhale 2 puffs into the lungs every 6 (six) hours as needed for wheezing or shortness of breath.   allopurinol  300 MG tablet Commonly known as: ZYLOPRIM  Take 300 mg by mouth daily.   aspirin  EC 81 MG tablet Take 81 mg by mouth daily.   atorvastatin  40 MG tablet Commonly known as: LIPITOR  Take 40 mg by mouth  daily.   bisacodyl  10 MG suppository Commonly known as: DULCOLAX Place 1 suppository (10 mg total) rectally daily as needed for moderate constipation.   carboxymethylcellulose 0.5 % Soln Commonly known as: REFRESH PLUS 1 drop 3 (three) times daily as needed.   Cholecalciferol 25 MCG (1000 UT) tablet Take 1,000 Units by mouth daily.   clobetasol cream 0.05 % Commonly known as: TEMOVATE Apply 1 Application topically 2 (two) times daily.   coal tar 0.5 % shampoo Commonly known as: NEUTROGENA T-GEL Apply 1 Application topically at bedtime as needed.   colchicine  0.6 MG tablet Take 1 tablet (0.6 mg total) by mouth 2 (two) times daily for 10 days.   cyanocobalamin  1000 MCG tablet Commonly known as: VITAMIN B12 Take 1 tablet (1,000 mcg total) by mouth daily.   desonide 0.05 % cream Commonly known as: DESOWEN Apply 1 Application topically 2 (two) times daily.   Ensure Max Protein Liqd Take 330 mLs (11 oz total) by mouth daily.   ferrous sulfate  325 (65 FE) MG EC tablet Take 1 tablet (325 mg total) by mouth daily with breakfast.   finasteride  5 MG tablet Commonly known as: PROSCAR  Take 5 mg by mouth at bedtime.   furosemide  40 MG tablet Commonly known as: LASIX  Take by mouth.  TAKE ONE TABLET BY MOUTH MONDAY, WEDNESDAY AND FRIDAY FOR FLUID   hydrOXYzine  10 MG tablet Commonly known as: ATARAX  Take 10 mg by mouth daily as needed.   ipratropium 0.06 % nasal spray Commonly known as: ATROVENT Place 2 sprays into both nostrils daily as needed for rhinitis.   ketoconazole 2 % cream Commonly known as: NIZORAL Apply 1 application topically daily. Apply 1 application to scales on sides of nose   levETIRAcetam  500 MG tablet Commonly known as: KEPPRA  Take 500 mg by mouth every morning. 1 tablet (500mg ) qam and 1/2 tablet (250mg ) in PM. Verified with pt's wife and using pill container on 06/01/22   lidocaine  5 % Commonly known as: Lidoderm  Place 1 patch onto the skin every 12  (twelve) hours. Remove & Discard patch within 12 hours or as directed by MD   magnesium  hydroxide 400 MG/5ML suspension Commonly known as: MILK OF MAGNESIA Take 30 mLs by mouth daily.   melatonin 5 MG Tabs Take 5 mg by mouth at bedtime as needed.   metoprolol  tartrate 25 MG tablet Commonly known as: LOPRESSOR  Take 12.5 mg by mouth 2 (two) times daily.   mometasone -formoterol  200-5 MCG/ACT Aero Commonly known as: DULERA  Inhale 2 puffs into the lungs 2 (two) times daily.   multivitamin with minerals tablet Take 1 tablet by mouth daily.   polyethylene glycol 17 g packet Commonly known as: MIRALAX  / GLYCOLAX  Take 17 g by mouth 2 (two) times daily.   prazosin  5 MG capsule Commonly known as: MINIPRESS  Take 10 mg by mouth at bedtime.   primidone  50 MG tablet Commonly known as: MYSOLINE  Take 50 mg by mouth 2 (two) times daily.  tamsulosin  0.4 MG Caps capsule Commonly known as: FLOMAX  Take 0.4 mg by mouth daily. bedtime   triamcinolone ointment 0.1 % Commonly known as: KENALOG Apply 1 Application topically 2 (two) times daily.   venlafaxine  XR 150 MG 24 hr capsule Commonly known as: EFFEXOR -XR Take 450 mg by mouth daily.       Allergies  Allergen Reactions   Shellfish Allergy Shortness Of Breath    Follow-up Information     [facility] Follow up.   Contact information: [address] [facility] [place] [address] [phone]         Thomasena Cantor C, PA-C Follow up.   Contact information: [address] [place] [address] [phone]         Wilburn Keller BROCKS, MD Follow up.   Specialty: Cardiology Contact information: [address] [place] [address] [phone]                  The results of significant diagnostics from this hospitalization (including imaging, microbiology, ancillary and laboratory) are listed below for reference.    Significant Diagnostic Studies: CT Angio Chest PE  W and/or Wo Contrast Result Date: 01/07/2023 CLINICAL DATA:  Pulmonary embolism (PE) suspected, low to intermediate prob, positive D-dimer. Seizure. EXAM: CT ANGIOGRAPHY CHEST WITH CONTRAST TECHNIQUE: Multidetector CT imaging of the chest was performed using the standard protocol during bolus administration of intravenous contrast. Multiplanar CT image reconstructions and MIPs were obtained to evaluate the vascular anatomy. RADIATION DOSE REDUCTION: This exam was performed according to the departmental dose-optimization program which includes automated exposure control, adjustment of the mA and/or kV according to patient size and/or use of iterative reconstruction technique. CONTRAST:  75mL OMNIPAQUE  IOHEXOL  350 MG/ML SOLN COMPARISON:  CT angiography chest from 10/01/2022. FINDINGS: Cardiovascular: No evidence of embolism to the proximal subsegmental pulmonary artery level. Mild cardiomegaly. No pericardial effusion. No aortic aneurysm. There are coronary artery calcifications, in keeping with coronary artery disease. There are also mild peripheral atherosclerotic vascular calcifications of thoracic aorta and its major branches. 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 There are moderate mitral annulus calcifications. Mediastinum/Nodes: Visualized thyroid gland appears grossly unremarkable. No solid / cystic mediastinal masses. The esophagus is nondistended precluding optimal assessment. No axillary, mediastinal or hilar lymphadenopathy by size criteria. Lungs/Pleura: The central tracheo-bronchial tree is patent. There are subsegmental atelectatic changes throughout bilateral lungs with lower lobe predominance. There are trace bilateral pleural effusions. No consolidation or mass. No pneumothorax. There is a stable 6 x 9 mm oval noncalcified opacity in the left major fissure which is unchanged since the prior study from 05/31/2022. No new or suspicious lung nodule. Upper Abdomen: There is a small sliding hiatal hernia. There  are at least 3, sub 4 mm, non obstructing calculi in the right kidney and at least 2, sub 4 mm nonobstructing calculi in the left kidney. Remaining visualized upper abdominal viscera within normal limits. Musculoskeletal: The visualized soft tissues of the chest wall are grossly unremarkable. No suspicious osseous lesions. There are mild to moderate multilevel degenerative changes in the visualized spine. Review of the MIP images confirms the above findings. IMPRESSION: 1. No embolism to the proximal subsegmental pulmonary artery level. 2. Redemonstration of subsegmental atelectatic changes in bilateral lungs. There are bilateral trace pleural effusions. No lung mass, consolidation or pneumothorax. No new or suspicious lung nodule. 3. Multiple other nonacute observations, as described above. Aortic Atherosclerosis (ICD10-I70.0). Electronically Signed   By: Ree Molt M.D.   On: 01/07/2023 16:33  CT HEAD WO CONTRAST ( ) Result Date: 01/07/2023 CLINICAL DATA:  Syncope/presyncope, cerebrovascular cause suspected. EXAM: CT HEAD WITHOUT CONTRAST TECHNIQUE: Contiguous axial images were obtained from the base of the skull through the vertex without intravenous contrast. RADIATION DOSE REDUCTION: This exam was performed according to the departmental dose-optimization program which includes automated exposure control, adjustment of the mA and/or kV according to patient size and/or use of iterative reconstruction technique. COMPARISON:  10/02/2022. FINDINGS: Brain: No evidence of acute infarction, hemorrhage, hydrocephalus, extra-axial collection or mass lesion/mass effect. There is bilateral periventricular hypodensity, which is non-specific but most likely seen in the settings of microvascular ischemic changes. Mild in extent. Otherwise normal appearance of brain parenchyma. Ventricles are normal. Cerebral volume is age appropriate. Vascular: No hyperdense vessel or unexpected calcification. Intracranial  arteriosclerosis. Skull: Normal. Negative for fracture or focal lesion. Sinuses/Orbits: No acute finding. Other: Visualized mastoid air cells are unremarkable. Trace right mastoid effusion. Unremarkable left mastoid air cells. IMPRESSION: *No acute intracranial abnormality. Electronically Signed   By: Ree Molt M.D.   On: 01/07/2023 12:59   DG Chest 2 View Result Date: 01/07/2023 CLINICAL DATA:  Low oxygen saturation. EXAM: CHEST - 2 VIEW COMPARISON:  10/01/2022. FINDINGS: Redemonstration of left middle lateral chest wall and right lower lateral chest wall pleural thickening, similar to the prior studies. There are also peripheral linear opacities overlying the left mid lower lung zones, favored to represent underlying fibrosis/atelectasis, similar to the prior study. Bilateral lung fields are otherwise clear. No acute consolidation or lung collapse. No frank pulmonary edema. There are persistent bilateral trace pleural effusions. Note is made of elevated right hemidiaphragm. Stable cardio-mediastinal silhouette. Sternotomy wires and prosthetic aortic valve noted. No acute osseous abnormalities. The soft tissues are within normal limits. IMPRESSION: *Essentially stable exam.  No acute cardiopulmonary abnormality. Electronically Signed   By: Ree Molt M.D.   On: 01/07/2023 12:56    Microbiology: No results found for this or any previous visit (from the past 240 hours).   Labs: Basic Metabolic Panel: Recent Labs  Lab 01/07/23 1304 01/08/23 0550  NA 132* 131*  K 4.3 3.9  CL 95* 96*  CO2 29 26  GLUCOSE 97 120*  BUN 37* 22  CREATININE 1.03 0.82  CALCIUM  8.8* 8.4*   Liver Function Tests: Recent Labs  Lab 01/07/23 1304  AST 17  ALT 45*  ALKPHOS 59  BILITOT 0.4  PROT 8.9*  ALBUMIN 2.9*   No results for input(s): LIPASE, AMYLASE in the last 168 hours. No results for input(s): AMMONIA in the last 168 hours. CBC: Recent Labs  Lab 01/07/23 1304 01/08/23 0550  WBC 11.1*  9.4  HGB 13.2 11.6*  HCT 43.2 37.5*  MCV 84.0 81.9  PLT 101* 99*   Cardiac Enzymes: No results for input(s): CKTOTAL, CKMB, CKMBINDEX, TROPONINI in the last 168 hours. BNP: BNP (last 3 results) Recent Labs    05/31/22 1542 10/01/22 1537 01/07/23 1304  BNP 108.6* 104.0* 102.2*    ProBNP (last 3 results) No results for input(s): PROBNP in the last 8760 hours.  CBG: No results for input(s): GLUCAP in the last 168 hours.     Signed:  Devaughn KATHEE Ban MD.  [facility] 01/08/2023, 12:14 PM

## 2023-01-08 NOTE — ED Notes (Addendum)
 Pt continues to yell out and take off CPAP mask. Mittens had been placed and pt has removed mittens. Mittens put back on pt.  At 0353 pt has removed CPAP and is resting

## 2023-01-09 LAB — LEVETIRACETAM LEVEL
Levetiracetam Lvl: <2 ug/mL — ABNORMAL LOW (ref 10.0–40.0)
Levetiracetam Lvl: 3 ug/mL — ABNORMAL LOW (ref 10.0–40.0)

## 2023-01-17 ENCOUNTER — Other Ambulatory Visit: Payer: Self-pay | Admitting: Radiology

## 2023-01-17 DIAGNOSIS — D472 Monoclonal gammopathy: Secondary | ICD-10-CM

## 2023-01-17 NOTE — H&P (Signed)
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 male  ex smoker with PMH sig for COPD, HLD, seizures, carotid artery stenosis, OSA, CAD, gout, BPH with LUTS, dementia who presents now with smoldering myeloma and failure to thrive.  Patient was treated with chemotherapy 10 months ago and since then has had failure to thrive as well as moderate thrombocytopenia.  Bone marrow biopsy now requested to evaluate for disease progression to myeloma.   Patient is Full Code  Past Medical History:  Diagnosis Date   COPD (chronic obstructive pulmonary disease) (HCC)    Hypercholesterolemia    Seizures (HCC)     No past surgical history on file.  Allergies: Shellfish allergy  Medications: Prior to Admission medications   Medication Sig Start Date End Date Taking? Authorizing Provider  acetaminophen (TYLENOL) 325 MG tablet Take 2 tablets (650 mg total) by mouth every 6 (six) hours as needed for mild pain (or Fever >/= 101). 10/10/22   Wouk, Wilfred Curtis, MD  albuterol (VENTOLIN HFA) 108 (90 Base) MCG/ACT inhaler Inhale 2 puffs into the lungs every 6 (six) hours as needed for wheezing or shortness of breath. 09/27/22   Merwyn Katos, MD  allopurinol (ZYLOPRIM) 300 MG tablet Take 300 mg by mouth daily.    [provider]  aspirin EC 81 MG tablet Take 81 mg by mouth daily.    [provider]  atorvastatin (LIPITOR) 40 MG tablet Take 40 mg by mouth daily.    [provider]  bisacodyl (DULCOLAX) 10 MG suppository Place 1 suppository (10 mg total) rectally daily as needed for moderate constipation. 10/10/22   Wouk, Wilfred Curtis, MD  carboxymethylcellulose (REFRESH PLUS) 0.5 % SOLN 1 drop 3 (three) times daily as needed.    [provider]  Cholecalciferol 25 MCG (1000 UT) tablet  Take 1,000 Units by mouth daily.    [provider]  clobetasol cream (TEMOVATE) 0.05 % Apply 1 Application topically 2 (two) times daily. 05/20/22   [provider]  coal tar (NEUTROGENA T-GEL) 0.5 % shampoo Apply 1 Application topically at bedtime as needed. 01/08/22   [provider]  colchicine 0.6 MG tablet Take 1 tablet (0.6 mg total) by mouth 2 (two) times daily for 10 days. 08/20/22 08/30/22  Orvil Feil, PA-C  cyanocobalamin (VITAMIN B12) 1000 MCG tablet Take 1 tablet (1,000 mcg total) by mouth daily. 06/04/22   Marrion Coy, MD  desonide (DESOWEN) 0.05 % cream Apply 1 Application topically 2 (two) times daily. 05/21/22   [provider]  Ensure Max Protein (ENSURE MAX PROTEIN) LIQD Take 330 mLs (11 oz total) by mouth daily. 10/10/22   Wouk, Wilfred Curtis, MD  ferrous sulfate 325 (65 FE) MG EC tablet Take 1 tablet (325 mg total) by mouth daily with breakfast. 06/04/22 10/01/22  Marrion Coy, MD  finasteride (PROSCAR) 5 MG tablet Take 5 mg by mouth at bedtime.    [provider]  furosemide (LASIX) 40 MG tablet Take by mouth.  TAKE ONE TABLET BY MOUTH MONDAY, WEDNESDAY AND FRIDAY FOR FLUID 10/19/22   [provider]  hydrOXYzine (ATARAX) 10 MG tablet Take 10 mg by mouth daily as needed. 05/20/22   [provider]  ipratropium (ATROVENT) 0.06 % nasal spray Place 2 sprays into both nostrils daily as needed for rhinitis.  [provider]  ketoconazole (NIZORAL) 2 % cream Apply 1 application topically daily. Apply 1 application to scales on sides of nose 09/11/19   [provider]  levETIRAcetam (KEPPRA) 500 MG tablet Take 500 mg by mouth every morning. 1 tablet (500mg ) qam and 1/2 tablet (250mg ) in PM. Verified with pt's wife and using pill container on 06/01/22    [provider]  lidocaine (LIDODERM) 5 % Place 1 patch onto the skin every 12 (twelve) hours. Remove & Discard patch within 12 hours or as directed by MD  09/26/22 09/26/23  Willy Eddy, MD  magnesium hydroxide (MILK OF MAGNESIA) 400 MG/5ML suspension Take 30 mLs by mouth daily. 10/10/22   Wouk, Wilfred Curtis, MD  melatonin 5 MG TABS Take 5 mg by mouth at bedtime as needed.    [provider]  metoprolol tartrate (LOPRESSOR) 25 MG tablet Take 12.5 mg by mouth 2 (two) times daily.    [provider]  mometasone-formoterol (DULERA) 200-5 MCG/ACT AERO Inhale 2 puffs into the lungs 2 (two) times daily. 10/10/22   Wouk, Wilfred Curtis, MD  Multiple Vitamins-Minerals (MULTIVITAMIN WITH MINERALS) tablet Take 1 tablet by mouth daily.    [provider]  polyethylene glycol (MIRALAX / GLYCOLAX) 17 g packet Take 17 g by mouth 2 (two) times daily. 10/10/22   Wouk, Wilfred Curtis, MD  prazosin (MINIPRESS) 5 MG capsule Take 10 mg by mouth at bedtime.    [provider]  primidone (MYSOLINE) 50 MG tablet Take 50 mg by mouth 2 (two) times daily. 05/23/22   [provider]  Spacer/Aero-Holding Chambers (AEROCHAMBER MV) inhaler Use as instructed 09/27/22   Merwyn Katos, MD  tamsulosin (FLOMAX) 0.4 MG CAPS capsule Take 0.4 mg by mouth daily. bedtime    [provider]  triamcinolone ointment (KENALOG) 0.1 % Apply 1 Application topically 2 (two) times daily. 12/03/11   [provider]  venlafaxine XR (EFFEXOR-XR) 150 MG 24 hr capsule Take 450 mg by mouth daily.    [provider]     No family history on file.  Social History   Socioeconomic History   Marital status: Married    Spouse name: Not on file   Number of children: Not on file   Years of education: Not on file   Highest education level: Not on file  Occupational History   Not on file  Tobacco Use   Smoking status: Former    Current packs/day: 0.00    Average packs/day: 1 pack/day for 30.0 years (30.0 ttl pk-yrs)    Types: Cigarettes    Start date: 38    Quit date: 1999    Years since quitting: 26.0   Smokeless tobacco: Never   Substance and Sexual Activity   Alcohol use: Not Currently   Drug use: Never   Sexual activity: Not Currently  Other Topics Concern   Not on file  Social History Narrative   Not on file   Social Drivers of Health   Financial Resource Strain: Not on file  Food Insecurity: No Food Insecurity (10/03/2022)   Hunger Vital Sign    Worried About Running Out of Food in the Last Year: Never true    Ran Out of Food in the Last Year: Never true  Transportation Needs: No Transportation Needs (10/03/2022)   PRAPARE - Administrator, Civil Service (Medical): No    Lack of Transportation (Non-Medical): No  Physical Activity: Not on file  Stress: Not on  file  Social Connections: Not on file       Review of Systems : denies fever,HA,CP,dyspnea, cough, abd/back pain, N/V or bleeding ; he is [facility]  Vital Signs:pending    Advance Care Plan: no documents on file  Physical Exam: awake/alert; chest- CTA bilat; heart- RRR; abd-soft, protuberant, +BS,NT; bilat pretibial edema noted  Imaging: CT Angio Chest PE W and/or Wo Contrast Result Date: 01/07/2023 CLINICAL DATA:  Pulmonary embolism (PE) suspected, low to intermediate prob, positive D-dimer. Seizure. EXAM: CT ANGIOGRAPHY CHEST WITH CONTRAST TECHNIQUE: Multidetector CT imaging of the chest was performed using the standard protocol during bolus administration of intravenous contrast. Multiplanar CT image reconstructions and MIPs were obtained to evaluate the vascular anatomy. RADIATION DOSE REDUCTION: This exam was performed according to the departmental dose-optimization program which includes automated exposure control, adjustment of the mA and/or kV according to patient size and/or use of iterative reconstruction technique. CONTRAST:  75mL OMNIPAQUE IOHEXOL 350 MG/ML SOLN COMPARISON:  CT angiography chest from 10/01/2022. FINDINGS: Cardiovascular: No evidence of embolism to the proximal subsegmental pulmonary artery level. Mild  cardiomegaly. No pericardial effusion. No aortic aneurysm. There are coronary artery calcifications, in keeping with coronary artery disease. There are also mild peripheral atherosclerotic vascular calcifications of thoracic aorta and its major branches. 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 There are moderate mitral annulus calcifications. Mediastinum/Nodes: Visualized thyroid gland appears grossly unremarkable. No solid / cystic mediastinal masses. The esophagus is nondistended precluding optimal assessment. No axillary, mediastinal or hilar lymphadenopathy by size criteria. Lungs/Pleura: The central tracheo-bronchial tree is patent. There are subsegmental atelectatic changes throughout bilateral lungs with lower lobe predominance. There are trace bilateral pleural effusions. No consolidation or mass. No pneumothorax. There is a stable 6 x 9 mm oval noncalcified opacity in the left major fissure which is unchanged since the prior study from 05/31/2022. No new or suspicious lung nodule. Upper Abdomen: There is a small sliding hiatal hernia. There are at least 3, sub 4 mm, non obstructing calculi in the right kidney and at least 2, sub 4 mm nonobstructing calculi in the left kidney. Remaining visualized upper abdominal viscera within normal limits. Musculoskeletal: The visualized soft tissues of the chest wall are grossly unremarkable. No suspicious osseous lesions. There are mild to moderate multilevel degenerative changes in the visualized spine. Review of the MIP images confirms the above findings. IMPRESSION: 1. No embolism to the proximal subsegmental pulmonary artery level. 2. Redemonstration of subsegmental atelectatic changes in bilateral lungs. There are bilateral trace pleural effusions. No lung mass, consolidation or pneumothorax. No new or suspicious lung nodule. 3. Multiple other nonacute observations, as described above. Aortic Atherosclerosis (ICD10-I70.0). Electronically Signed   By: Jules Schick M.D.    On: 01/07/2023 16:33   CT HEAD WO CONTRAST ( ) Result Date: 01/07/2023 CLINICAL DATA:  Syncope/presyncope, cerebrovascular cause suspected. EXAM: CT HEAD WITHOUT CONTRAST TECHNIQUE: Contiguous axial images were obtained from the base of the skull through the vertex without intravenous contrast. RADIATION DOSE REDUCTION: This exam was performed according to the departmental dose-optimization program which includes automated exposure control, adjustment of the mA and/or kV according to patient size and/or use of iterative reconstruction technique. COMPARISON:  10/02/2022. FINDINGS: Brain: No evidence of acute infarction, hemorrhage, hydrocephalus, extra-axial collection or mass lesion/mass effect. There is bilateral periventricular hypodensity, which is non-specific but most likely seen in the settings of microvascular ischemic changes. Mild in extent. Otherwise normal appearance of brain parenchyma. Ventricles are normal. Cerebral volume is age appropriate. Vascular: No hyperdense vessel or  unexpected calcification. Intracranial arteriosclerosis. Skull: Normal. Negative for fracture or focal lesion. Sinuses/Orbits: No acute finding. Other: Visualized mastoid air cells are unremarkable. Trace right mastoid effusion. Unremarkable left mastoid air cells. IMPRESSION: *No acute intracranial abnormality. Electronically Signed   By: Jules Schick M.D.   On: 01/07/2023 12:59   DG Chest 2 View Result Date: 01/07/2023 CLINICAL DATA:  Low oxygen saturation. EXAM: CHEST - 2 VIEW COMPARISON:  10/01/2022. FINDINGS: Redemonstration of left middle lateral chest wall and right lower lateral chest wall pleural thickening, similar to the prior studies. There are also peripheral linear opacities overlying the left mid lower lung zones, favored to represent underlying fibrosis/atelectasis, similar to the prior study. Bilateral lung fields are otherwise clear. No acute consolidation or lung collapse. No frank pulmonary edema. There  are persistent bilateral trace pleural effusions. Note is made of elevated right hemidiaphragm. Stable cardio-mediastinal silhouette. Sternotomy wires and prosthetic aortic valve noted. No acute osseous abnormalities. The soft tissues are within normal limits. IMPRESSION: *Essentially stable exam.  No acute cardiopulmonary abnormality. Electronically Signed   By: Jules Schick M.D.   On: 01/07/2023 12:56    Labs:  CBC: Recent Labs    10/04/22 0417 10/08/22 0413 01/07/23 1304 01/08/23 0550  WBC 7.9 9.7 11.1* 9.4  HGB 10.7* 10.7* 13.2 11.6*  HCT 33.8* 33.8* 43.2 37.5*  PLT 66* 94* 101* 99*    COAGS: Recent Labs    06/01/22 0419  INR 1.2  APTT 31    BMP: Recent Labs    10/06/22 0615 10/08/22 0413 01/07/23 1304 01/08/23 0550  NA 132* 131* 132* 131*  K 3.8 4.4 4.3 3.9  CL 98 95* 95* 96*  CO2 27 29 29 26   GLUCOSE 129* 130* 97 120*  BUN 23 28* 37* 22  CALCIUM 7.9* 8.0* 8.8* 8.4*  CREATININE 0.88 0.82 1.03 0.82  GFRNONAA >60 >60 >60 >60    LIVER FUNCTION TESTS: Recent Labs    09/27/22 1154 10/01/22 1744 10/02/22 1020 01/07/23 1304  BILITOT 0.3 0.1* 0.3 0.4  AST 14* 29 33 17  ALT 40 57* 66* 45*  ALKPHOS 57 55 56 59  PROT 8.3* 7.9 7.7 8.9*  ALBUMIN 2.5* 2.3* 2.1* 2.9*    TUMOR MARKERS: No results for input(s): "AFPTM", "CEA", "CA199", "CHROMGRNA" in the last 8760 hours.  Assessment and Plan: 78 y.o. male  ex smoker with PMH sig for COPD, HLD, seizures, carotid artery stenosis, OSA, CAD, gout, BPH with LUTS, dementia who presents now with smoldering myeloma and failure to thrive.  Patient was treated with chemotherapy 10 months ago and since then has had failure to thrive as well as moderate thrombocytopenia.  Bone marrow biopsy now requested to evaluate for disease progression to myeloma.Risks and benefits of procedure was discussed with the patient /spouse including, but not limited to bleeding, infection, damage to adjacent structures or low yield requiring  additional tests.  All of the questions were answered and there is agreement to proceed.  Consent signed and in chart.     Thank you for allowing our service to participate in SHERVIN LAMERS 's care.  Electronically Signed: D. Jeananne Rama, PA-C   01/17/2023, 3:20 PM      I spent a total of  15 Minutes   in face to face in clinical consultation, greater than 50% of which was counseling/coordinating care for CT guided bone marrow biopsy

## 2023-01-21 ENCOUNTER — Ambulatory Visit (HOSPITAL_COMMUNITY): Admission: RE | Admit: 2023-01-21 | Payer: No Typology Code available for payment source | Source: Ambulatory Visit

## 2023-01-21 ENCOUNTER — Encounter (HOSPITAL_COMMUNITY): Payer: Self-pay

## 2023-01-21 ENCOUNTER — Other Ambulatory Visit (HOSPITAL_COMMUNITY): Payer: Self-pay | Admitting: Internal Medicine

## 2023-01-21 ENCOUNTER — Ambulatory Visit (HOSPITAL_COMMUNITY)
Admission: RE | Admit: 2023-01-21 | Discharge: 2023-01-21 | Disposition: A | Discharge status: Home / Self Care | Payer: No Typology Code available for payment source | Source: Ambulatory Visit | Attending: Internal Medicine | Admitting: Internal Medicine

## 2023-01-21 ENCOUNTER — Other Ambulatory Visit: Payer: Self-pay

## 2023-01-21 DIAGNOSIS — R627 Adult failure to thrive: Secondary | ICD-10-CM

## 2023-01-21 DIAGNOSIS — D696 Thrombocytopenia, unspecified: Secondary | ICD-10-CM | POA: Diagnosis not present

## 2023-01-21 DIAGNOSIS — I2510 Atherosclerotic heart disease of native coronary artery without angina pectoris: Secondary | ICD-10-CM | POA: Insufficient documentation

## 2023-01-21 DIAGNOSIS — C9000 Multiple myeloma not having achieved remission: Secondary | ICD-10-CM

## 2023-01-21 DIAGNOSIS — M109 Gout, unspecified: Secondary | ICD-10-CM | POA: Insufficient documentation

## 2023-01-21 DIAGNOSIS — N401 Enlarged prostate with lower urinary tract symptoms: Secondary | ICD-10-CM | POA: Diagnosis not present

## 2023-01-21 DIAGNOSIS — Z87891 Personal history of nicotine dependence: Secondary | ICD-10-CM | POA: Insufficient documentation

## 2023-01-21 DIAGNOSIS — J449 Chronic obstructive pulmonary disease, unspecified: Secondary | ICD-10-CM | POA: Diagnosis not present

## 2023-01-21 DIAGNOSIS — Z9221 Personal history of antineoplastic chemotherapy: Secondary | ICD-10-CM | POA: Diagnosis not present

## 2023-01-21 DIAGNOSIS — D472 Monoclonal gammopathy: Secondary | ICD-10-CM

## 2023-01-21 DIAGNOSIS — F0390 Unspecified dementia without behavioral disturbance: Secondary | ICD-10-CM | POA: Diagnosis not present

## 2023-01-21 DIAGNOSIS — R569 Unspecified convulsions: Secondary | ICD-10-CM | POA: Insufficient documentation

## 2023-01-21 DIAGNOSIS — G4733 Obstructive sleep apnea (adult) (pediatric): Secondary | ICD-10-CM | POA: Diagnosis not present

## 2023-01-21 HISTORY — PX: IR BONE MARROW BIOPSY & ASPIRATION: IMG5727

## 2023-01-21 HISTORY — DX: Sleep apnea, unspecified: G47.30

## 2023-01-21 LAB — CBC WITH DIFFERENTIAL/PLATELET
Abs Immature Granulocytes: 0.16 10*3/uL — ABNORMAL HIGH (ref 0.00–0.07)
Basophils Absolute: 0.1 10*3/uL (ref 0.0–0.1)
Basophils Relative: 1 %
Eosinophils Absolute: 0.3 10*3/uL (ref 0.0–0.5)
Eosinophils Relative: 4 %
HCT: 38.9 % — ABNORMAL LOW (ref 39.0–52.0)
Hemoglobin: 11.9 g/dL — ABNORMAL LOW (ref 13.0–17.0)
Immature Granulocytes: 2 %
Lymphocytes Relative: 17 %
Lymphs Abs: 1.3 10*3/uL (ref 0.7–4.0)
MCH: 25.6 pg — ABNORMAL LOW (ref 26.0–34.0)
MCHC: 30.6 g/dL (ref 30.0–36.0)
MCV: 83.7 fL (ref 80.0–100.0)
Monocytes Absolute: 0.5 10*3/uL (ref 0.1–1.0)
Monocytes Relative: 7 %
Neutro Abs: 5.3 10*3/uL (ref 1.7–7.7)
Neutrophils Relative %: 69 %
Platelet Morphology: NORMAL
Platelets: 74 10*3/uL — ABNORMAL LOW (ref 150–400)
RBC: 4.65 MIL/uL (ref 4.22–5.81)
RDW: 20.1 % — ABNORMAL HIGH (ref 11.5–15.5)
WBC: 7.7 10*3/uL (ref 4.0–10.5)
nRBC: 0 % (ref 0.0–0.2)

## 2023-01-21 LAB — GLUCOSE, CAPILLARY: Glucose-Capillary: 122 mg/dL — ABNORMAL HIGH (ref 70–99)

## 2023-01-21 MED ORDER — LIDOCAINE HCL (PF) 1 % IJ SOLN
INTRAMUSCULAR | Status: AC
  Filled 2023-01-21: qty 30

## 2023-01-21 MED ORDER — MIDAZOLAM HCL 2 MG/2ML IJ SOLN
INTRAMUSCULAR | Status: AC
  Filled 2023-01-21: qty 2

## 2023-01-21 MED ORDER — MIDAZOLAM HCL 2 MG/2ML IJ SOLN
INTRAMUSCULAR | Status: AC | PRN
  Administered 2023-01-21: 1 mg via INTRAVENOUS

## 2023-01-21 MED ORDER — SODIUM CHLORIDE 0.9 % IV SOLN: INTRAVENOUS | Status: DC

## 2023-01-21 MED ORDER — FENTANYL CITRATE (PF) 100 MCG/2ML IJ SOLN
INTRAMUSCULAR | Status: AC
  Filled 2023-01-21: qty 2

## 2023-01-21 MED ORDER — FENTANYL CITRATE (PF) 100 MCG/2ML IJ SOLN
INTRAMUSCULAR | Status: AC | PRN
  Administered 2023-01-21: 50 ug via INTRAVENOUS

## 2023-01-21 MED ORDER — LIDOCAINE HCL (PF) 1 % IJ SOLN
20.0000 mL | Freq: Once | INTRAMUSCULAR | Status: AC
  Administered 2023-01-21: 10 mL via INTRADERMAL

## 2023-01-21 NOTE — OR Nursing (Signed)
BS checked in pre-op, 122. Allred PA notified, no new orders received.

## 2023-01-21 NOTE — Discharge Instructions (Signed)

## 2023-01-21 NOTE — Procedures (Signed)
Interventional Radiology Procedure:   Indications: Multiple myeloma   Procedure: Image guided bone marrow biopsy  Findings: 2 aspirates and 1 core from right ilium  Complications: None     EBL: Minimal, less than 10 ml  Plan: Discharge to home in one hour.   Nahomy Limburg R. Lowella Dandy, MD  Pager: [phone]

## 2023-01-24 LAB — SURGICAL PATHOLOGY

## 2023-01-30 ENCOUNTER — Encounter (HOSPITAL_COMMUNITY): Payer: Self-pay

## 2023-02-21 ENCOUNTER — Encounter (HOSPITAL_COMMUNITY): Payer: Self-pay | Admitting: Internal Medicine

## 2023-03-02 DEATH — deceased

## 2023-03-11 ENCOUNTER — Encounter (HOSPITAL_COMMUNITY): Payer: Self-pay

## 2023-11-12 IMAGING — CT CT HEAD W/O CM
4 series · 17 of 47 positions shown, 19 images · non-contrast
Comparison: January 21, 2020

CLINICAL DATA: Altered mental status.  Nontraumatic.



[Series 2: head bone · axial · 0.45mm/px · z∈[-122,-62]mm · 4 of 86 slices shown]
[im 9/86  bone]
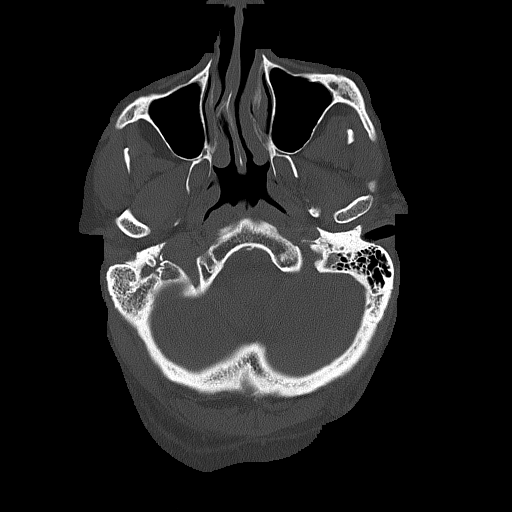
[im 18/86  bone]
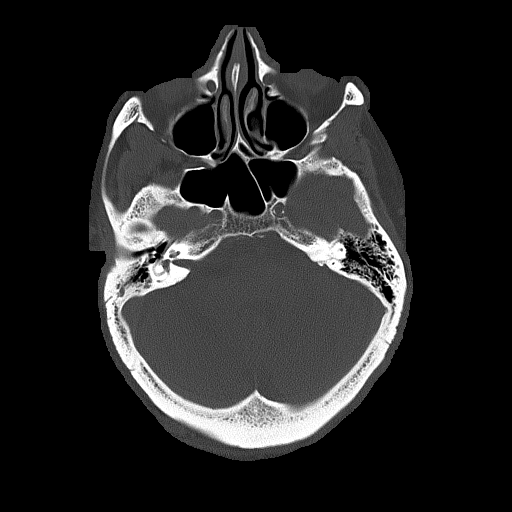
[im 26/86  bone]
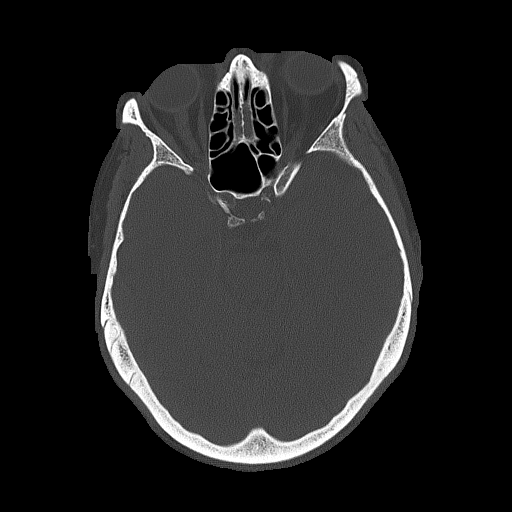
[im 39/86  bone]
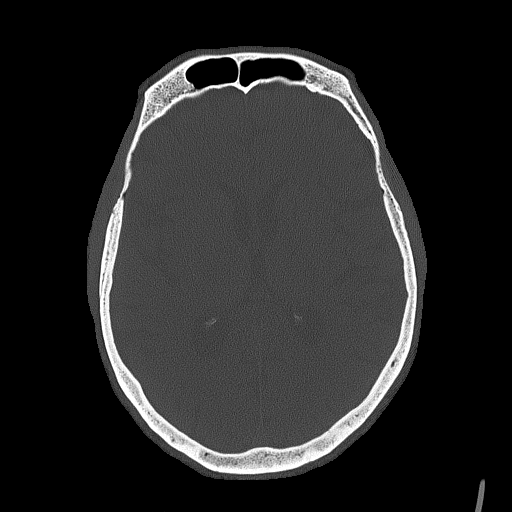

[Series 3: head wo · axial · 0.45mm/px · z∈[-118,+7]mm · 7 of 35 slices shown, 9 images]
[im 5/35  brain]
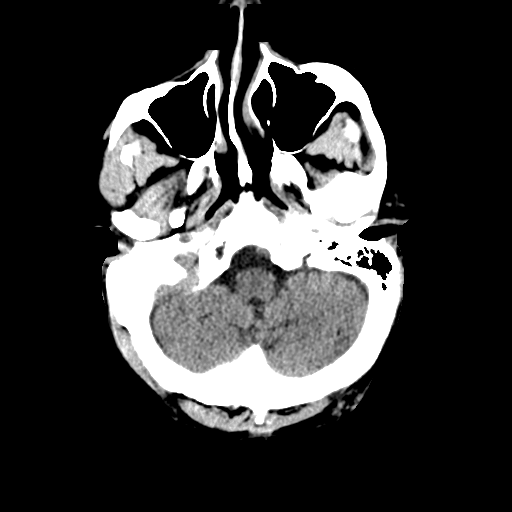
[im 5/35  bone]
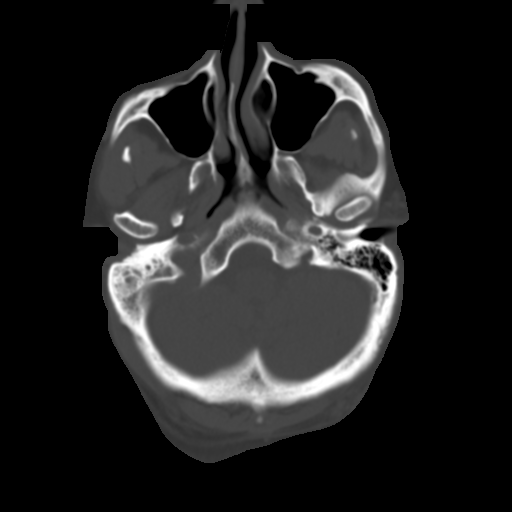
[im 9/35  brain]
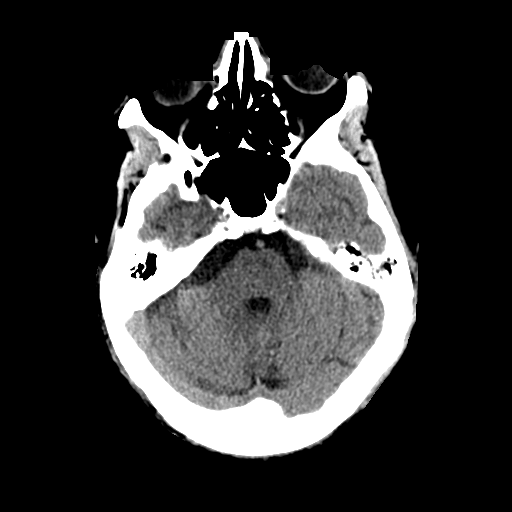
[im 13/35  brain]
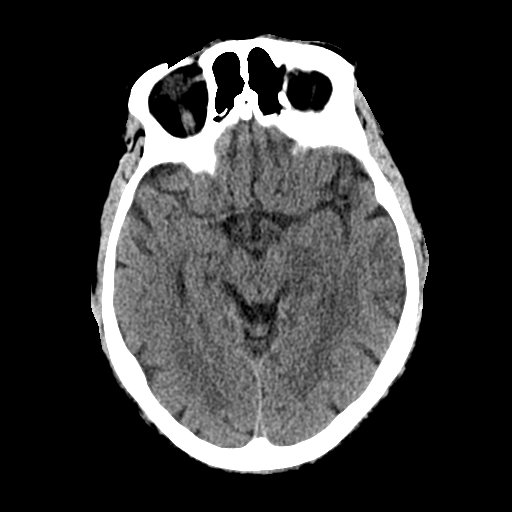
[im 18/35  brain]
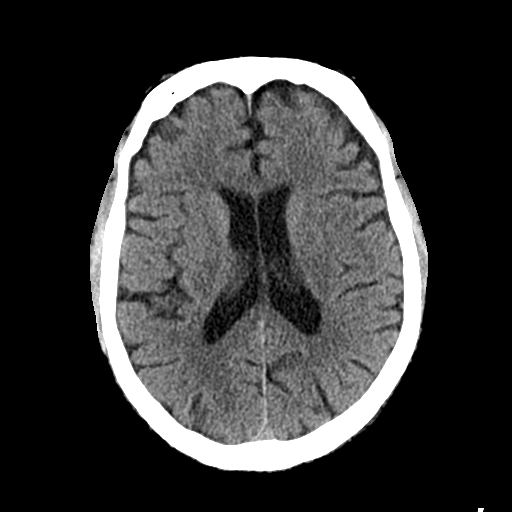
[im 22/35  brain]
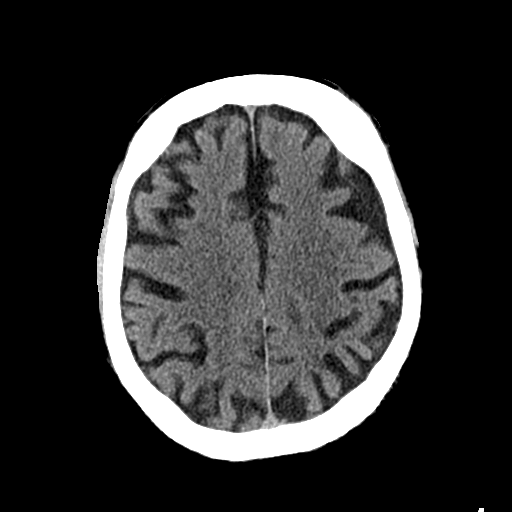
[im 22/35  bone]
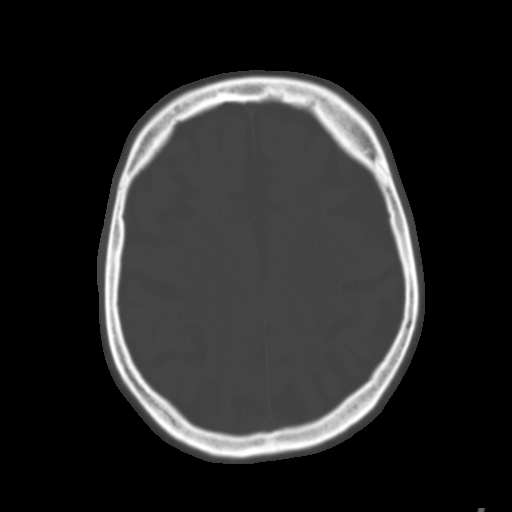
[im 26/35  brain]
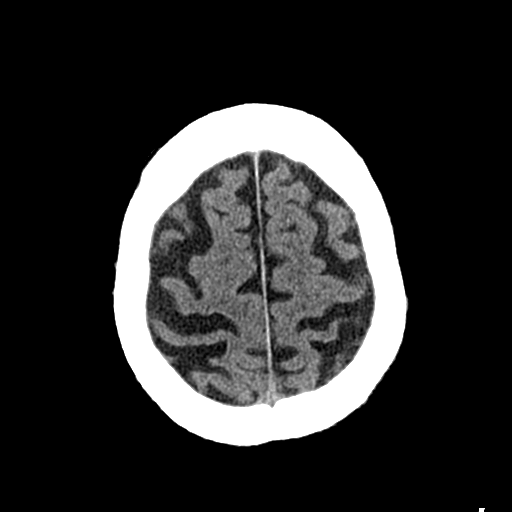
[im 30/35  brain]
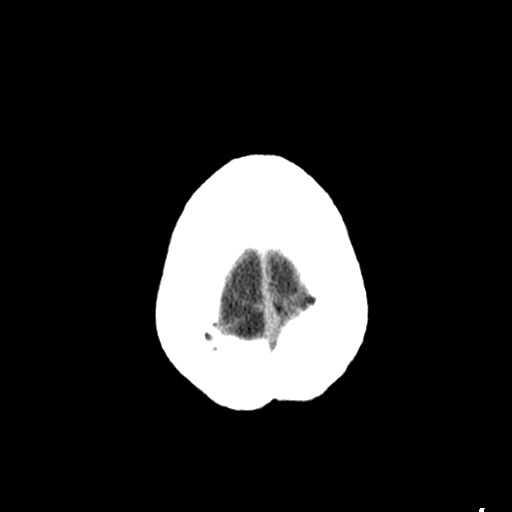

[Series 4: coronal soft tissue · coronal · 0.35mm/px · 3 of 74 slices shown]
[im 25/74  brain]
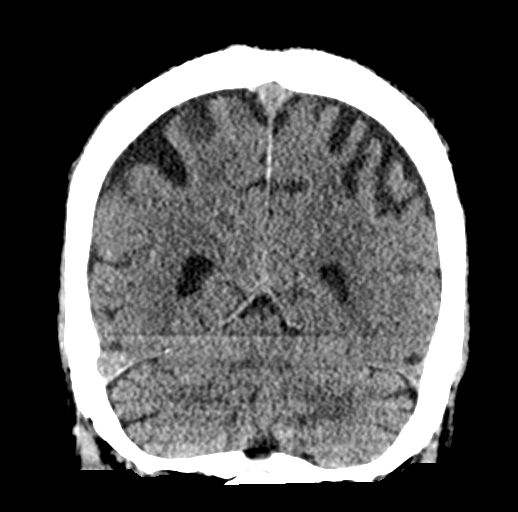
[im 33/74  brain]
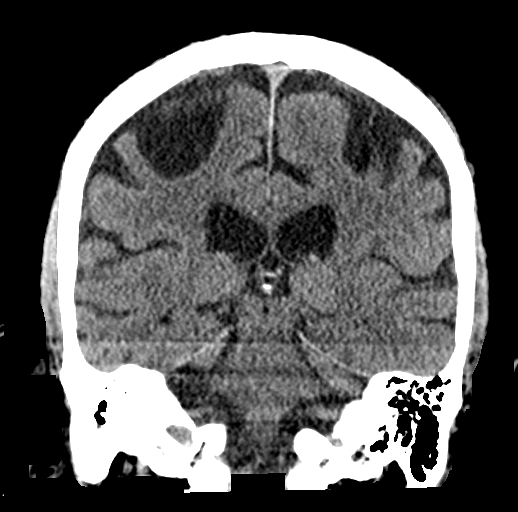
[im 41/74  brain]
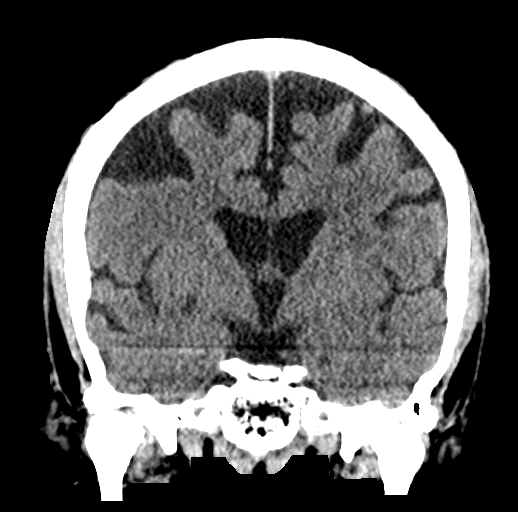

[Series 5: sagittal soft tissue · sagittal · 0.35mm/px · 3 of 58 slices shown]
[im 20/58  brain]
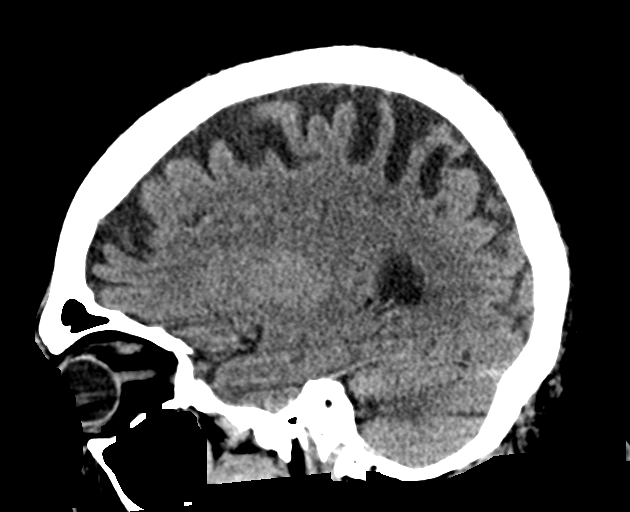
[im 29/58  brain]
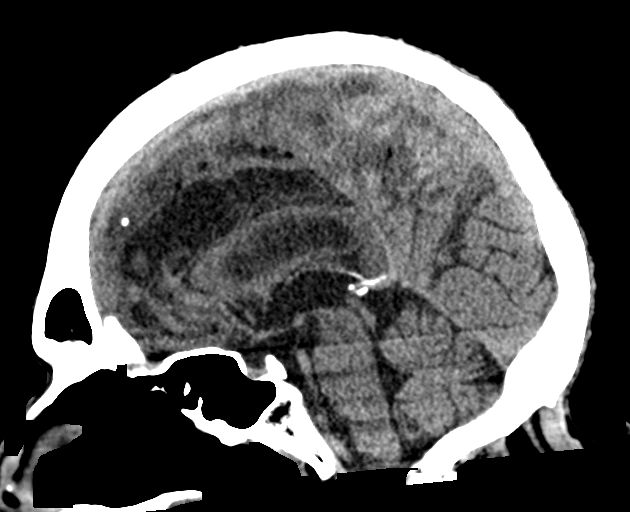
[im 39/58  brain]
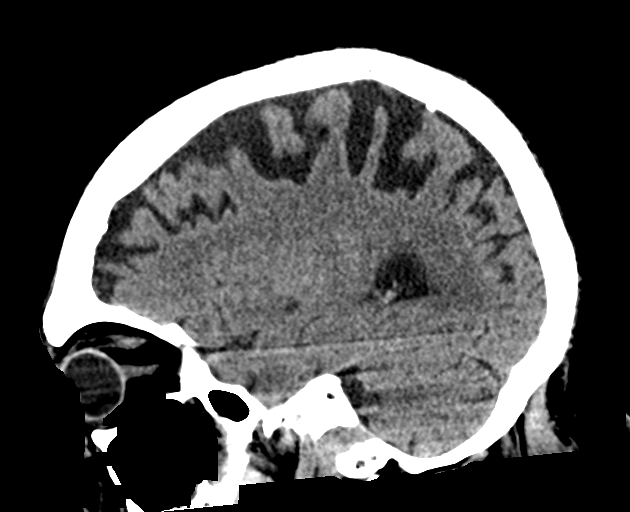

[17 of 47 positions shown; findings below may reference images not displayed]

FINDINGS: Brain: No evidence of acute infarction, hemorrhage, hydrocephalus,
extra-axial collection or mass lesion/mass effect. Mild
periventricular white matter small vessel ischemic changes are
identified. Small chronic old lacunar infarction is identified in
the right basal ganglia unchanged.

Vascular: No hyperdense vessel is noted.

Skull: Normal. Negative for fracture or focal lesion.

Sinuses/Orbits: Small right maxillary sinus retention cyst is
identified.

Other: None.
IMPRESSION: 1. No CT evidence of acute intracranial abnormality.
2. Mild periventricular white matter small vessel ischemic changes.
